# Patient Record
Sex: Female | Born: 2000 | Race: Black or African American | Hispanic: No | Smoking: Never smoker
Health system: Southern US, Community
[De-identification: ages and names within clinical notes are randomized; demographics above are authoritative.]

## PROBLEM LIST (undated history)

## (undated) ENCOUNTER — Inpatient Hospital Stay (HOSPITAL_COMMUNITY): Payer: Self-pay

## (undated) ENCOUNTER — Inpatient Hospital Stay: Payer: Self-pay

## (undated) DIAGNOSIS — F419 Anxiety disorder, unspecified: Secondary | ICD-10-CM

## (undated) DIAGNOSIS — F32A Depression, unspecified: Secondary | ICD-10-CM

## (undated) DIAGNOSIS — G43909 Migraine, unspecified, not intractable, without status migrainosus: Secondary | ICD-10-CM

## (undated) DIAGNOSIS — J45909 Unspecified asthma, uncomplicated: Secondary | ICD-10-CM

## (undated) DIAGNOSIS — T7840XA Allergy, unspecified, initial encounter: Secondary | ICD-10-CM

## (undated) HISTORY — PX: WISDOM TOOTH EXTRACTION: SHX21

## (undated) HISTORY — DX: Allergy, unspecified, initial encounter: T78.40XA

---
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
## 2020-03-19 ENCOUNTER — Other Ambulatory Visit: Payer: Self-pay | Admitting: Physician Assistant

## 2020-03-19 DIAGNOSIS — Z3042 Encounter for surveillance of injectable contraceptive: Secondary | ICD-10-CM

## 2020-03-19 MED ORDER — MEDROXYPROGESTERONE ACETATE 150 MG/ML IM SUSP
150.0000 mg | INTRAMUSCULAR | Status: AC
  Administered 2020-03-20 – 2020-06-05 (×2): 150 mg via INTRAMUSCULAR

## 2020-03-19 NOTE — Progress Notes (Signed)
Per chart review, patient with first visit 09/2019.  CBE due 2022 and pap due 2023.  Provided that patient desires to continue with Depo and BP is normal, may continue with Depo.

## 2020-03-20 ENCOUNTER — Ambulatory Visit (LOCAL_COMMUNITY_HEALTH_CENTER): Payer: Managed Care, Other (non HMO)

## 2020-03-20 ENCOUNTER — Other Ambulatory Visit: Payer: Self-pay

## 2020-03-20 VITALS — BP 131/83 | Ht 63.0 in | Wt 227.5 lb

## 2020-03-20 DIAGNOSIS — Z3042 Encounter for surveillance of injectable contraceptive: Secondary | ICD-10-CM

## 2020-03-20 DIAGNOSIS — Z3009 Encounter for other general counseling and advice on contraception: Secondary | ICD-10-CM

## 2020-03-20 DIAGNOSIS — Z30013 Encounter for initial prescription of injectable contraceptive: Secondary | ICD-10-CM

## 2020-03-20 NOTE — Progress Notes (Signed)
12.3 weeks post depo today.  DMPA 150 mg IM administered today per Sadie Haber, PA order dated 03/19/20 (depo order for 03/20/20 - 09/15/2020). Consulted with Samara Snide, PA regarding pts heavy spotting/dark discharge every day: pt can continue with depo as ordered if she desires, schedule provider appt if she wants to pursue other birth control options. Depo administered per pt request, declines provider appt at this time, will call to schedule appt if needed after this depo shot.

## 2020-03-25 NOTE — Progress Notes (Signed)
New patient visit    Patient: Catherine [name]   DOB: Aug 04, 2001   18 y.o. Female  MRN: [id number] Visit Date: 04/03/2020  Today's healthcare provider: Jairo Ben, FNP  Subjective:    Chief Complaint  Patient presents with  . New Patient (Initial Visit)    Catherine [name] is a 19 y.o. female who presents today as a new patient to establish care.  HPI  Patient reports that she feels well today and has no concerns to address, patient comes to our practice from [facility]. Patient reports that she follows a well balanced diet, she is not actively exercising and states that her sleep is poor. Patient reported difficulty falling asleep and staying asleep, on average patient reported that she sleeps 4-5hrs a night.   She started Depo injections since November, she took last one on April 16 th.  She has had pretty heavy cycles since starting Depo injections. [facility].   Increased thirst and weight gain.  She reports brother is 35 and has diabetes she is unsure of type. Her father also had borderline diabetic. She is sexually active. One partner female partner  Relations. Denies any concerns for STD's amd she also has no history of STD's in past reported.   She has had a history of migraine like headaches since 2018, she gets headaches " very often"  Maybe slightly less than she was in 2018 she reports. She has more than 3 headaches per week. She reports that she usually has generalized headaches but tendency is to be right sided headaches. She has nausea, sensitivity to light and mild dizziness. No vomiting. Denies any any trauma. She reports she gets no relief with Fioricet and Maxalt.  Her father at age 48  and grandfather deceased of brain aneurysm.     She takes Ibuprofen 800 mg every 8 hours with cycle an reports they do not help much with the menstraul cramping.    Patient  denies any fever, body aches,chills, rash, chest pain, shortness of breath, nausea,  vomiting, or diarrhea.   No LMP recorded. Patient has had an injection.   Past Medical History:  Diagnosis Date  . Allergy   . Asthma    past hx  . Migraine    Past Surgical History:  Procedure Laterality Date  . TONSILLECTOMY Bilateral 08/11/2015   Procedure: CONTROL POST TONSILLECTOMY BLEEDING ;  Surgeon: Vernie Murders, MD;  Location: [facility] OR[facility];  Service: ENT;  Laterality: Bilateral;  . TONSILLECTOMY AND ADENOIDECTOMY N/A 07/31/2015   Procedure: TONSILLECTOMY AND ADENOIDECTOMY;  Surgeon: Linus Salmons, MD;  Location: [facility];  Service: ENT;  Laterality: N/A;   Family Status  Relation Name Status  . Father  (Not Specified)  . Brother  (Not Specified)  . MGM  (Not Specified)  . Mother  (Not Specified)   Family History  Problem Relation Age of Onset  . Hypertension Father   . Hypertension Brother   . Diabetes Brother   . Breast cancer Maternal Grandmother   . Cancer Maternal Grandmother   . Bipolar disorder Mother   . Depression Mother   . Cancer Mother   . Schizophrenia Mother    Social History   Socioeconomic History  . Marital status: Single    Spouse name: Not on file  . Number of children: Not on file  . Years of education: Not on file  . Highest education level: Not on file  Occupational History  . Not  on file  Tobacco Use  . Smoking status: Never Smoker  . Smokeless tobacco: Never Used  Substance and Sexual Activity  . Alcohol use: No  . Drug use: Not on file  . Sexual activity: Not on file  Other Topics Concern  . Not on file  Social History Narrative  . Not on file   Social Determinants of Health   Financial Resource Strain:   . Difficulty of Paying Living Expenses:   Food Insecurity:   . Worried About Programme researcher, broadcasting/film/videounning Out of Food in the Last Year:   . Baristaan Out of Food in the Last Year:   Transportation Needs:   . Freight forwarderLack of Transportation (Medical):   Marland Kitchen. Lack of Transportation (Non-Medical):   Physical Activity:   . Days of Exercise per Week:    . Minutes of Exercise per Session:   Stress:   . Feeling of Stress :   Social Connections:   . Frequency of Communication with Friends and Family:   . Frequency of Social Gatherings with Friends and Family:   . Attends Religious Services:   . Active Member of Clubs or Organizations:   . Attends BankerClub or Organization Meetings:   Marland Kitchen. Marital Status:    Outpatient Medications Prior to Visit  Medication Sig  . [DISCONTINUED] butalbital-acetaminophen-caffeine (FIORICET, ESGIC) 50-325-40 MG tablet Take 1 tablet by mouth every 4 (four) hours as needed for headache.  . [DISCONTINUED] rizatriptan (MAXALT) 5 MG tablet Take 1 tablet by mouth as needed for migraine.   Facility-Administered Medications Prior to Visit  Medication Dose Route Frequency Provider  . medroxyPROGESTERone (DEPO-PROVERA) injection 150 mg  150 mg Intramuscular Q90 days Matt HolmesHampton, Carla J, [place]   No Known Allergies  Patient Care Team: Floydene FlockNewton, Steven J, MD as PCP - General (Family Medicine)  Review of Systems  Constitutional: Positive for fatigue. Negative for activity change, appetite change, chills, diaphoresis, fever and unexpected weight change.  Respiratory: Negative.   Cardiovascular: Negative for chest pain, palpitations and leg swelling.  Gastrointestinal: Positive for abdominal pain. Negative for abdominal distention, anal bleeding, blood in stool, constipation, diarrhea, nausea, rectal pain and vomiting.  Endocrine: Positive for polydipsia. Negative for polyphagia and polyuria.  Genitourinary: Positive for menstrual problem and vaginal bleeding. Negative for dyspareunia, vaginal discharge and vaginal pain.  Musculoskeletal: Negative.   Skin: Negative.   Allergic/Immunologic: Positive for environmental allergies.  Neurological: Positive for dizziness and headaches. Negative for tremors, seizures, syncope, facial asymmetry, speech difficulty, weakness, light-headedness and numbness.  Hematological: Negative for  adenopathy. Does not bruise/bleed easily.  Psychiatric/Behavioral: Positive for sleep disturbance. Negative for agitation, behavioral problems, confusion, decreased concentration, dysphoric mood, hallucinations, self-injury and suicidal ideas. The patient is hyperactive. The patient is not nervous/anxious.   All other systems reviewed and are negative.   Last CBC Lab Results  Component Value Date   WBC 11.6 (H) 07/08/2018   HGB 11.8 (L) 07/08/2018   HCT 36.4 07/08/2018   MCV 73.9 (L) 07/08/2018   MCH 24.0 (L) 07/08/2018   RDW 18.3 (H) 07/08/2018   PLT 442 (H) 07/08/2018   Last metabolic panel Lab Results  Component Value Date   GLUCOSE 88 07/08/2018   NA 138 07/08/2018   K 3.9 07/08/2018   CL 105 07/08/2018   CO2 24 07/08/2018   BUN 17 07/08/2018   CREATININE 0.64 07/08/2018   GFRNONAA NOT CALCULATED 07/08/2018   GFRAA NOT CALCULATED 07/08/2018   CALCIUM 8.9 07/08/2018   PROT 6.5 08/10/2015   ALBUMIN 3.6 08/10/2015  BILITOT 0.5 08/10/2015   ALKPHOS 70 08/10/2015   AST 20 08/10/2015   ALT 9 (L) 08/10/2015   ANIONGAP 9 07/08/2018   Last lipids No results found for: CHOL, HDL, LDLCALC, LDLDIRECT, TRIG, CHOLHDL Last hemoglobin A1c No results found for: HGBA1C Last thyroid functions No results found for: TSH, T3TOTAL, T4TOTAL, THYROIDAB Last vitamin D No results found for: 25OHVITD2, 25OHVITD3, VD25OH Last vitamin B12 and Folate No results found for: VITAMINB12, FOLATE     Objective:    BP 110/70   Pulse 91   Temp (!) 96.9 F (36.1 C) (Oral)   Resp 16   Ht  (1.6 m)   Wt 228 lb 9.6 oz (103.7 kg)   SpO2 99%   BMI 40.49 kg/m   Temperature was temporal not oral. Physical Exam Vitals reviewed.  Constitutional:      General: She is not in acute distress.    Appearance: She is well-developed. She is obese. She is not ill-appearing, toxic-appearing or diaphoretic.     Interventions: She is not intubated. HENT:     Head: Normocephalic and atraumatic.      Right Ear: External ear normal.     Left Ear: External ear normal.     Nose: Nose normal.     Mouth/Throat:     Mouth: Mucous membranes are moist.     Pharynx: No oropharyngeal exudate or posterior oropharyngeal erythema.  Eyes:     General: Lids are normal. No scleral icterus.       Right eye: No discharge.        Left eye: No discharge.     Extraocular Movements: Extraocular movements intact.     Conjunctiva/sclera: Conjunctivae normal.     Right eye: Right conjunctiva is not injected. No exudate or hemorrhage.    Left eye: Left conjunctiva is not injected. No exudate or hemorrhage.    Pupils: Pupils are equal, round, and reactive to light.  Neck:     Thyroid: No thyroid mass or thyromegaly.     Vascular: Normal carotid pulses. No carotid bruit, hepatojugular reflux or JVD.     Trachea: Trachea and phonation normal. No tracheal tenderness or tracheal deviation.     Meningeal: Brudzinski's sign and Kernig's sign absent.  Cardiovascular:     Rate and Rhythm: Normal rate and regular rhythm.     Pulses: Normal pulses.          Radial pulses are 2+ on the right side and 2+ on the left side.       Dorsalis pedis pulses are 2+ on the right side and 2+ on the left side.       Posterior tibial pulses are 2+ on the right side and 2+ on the left side.     Heart sounds: Normal heart sounds, S1 normal and S2 normal. Heart sounds not distant. No murmur. No friction rub. No gallop.   Pulmonary:     Effort: Pulmonary effort is normal. No tachypnea, bradypnea, accessory muscle usage or respiratory distress. She is not intubated.     Breath sounds: Normal breath sounds. No stridor. No wheezing, rhonchi or rales.  Chest:     Chest wall: No tenderness.  Abdominal:     General: Bowel sounds are normal. There is no distension or abdominal bruit.     Palpations: Abdomen is soft. There is no shifting dullness, fluid wave, hepatomegaly, splenomegaly, mass or pulsatile mass.     Tenderness: There is  abdominal tenderness in the suprapubic  area. There is no right CVA tenderness, left CVA tenderness, guarding or rebound. Negative signs include Murphy's sign, Rovsing's sign, McBurney's sign, psoas sign and obturator sign.     Hernia: No hernia is present.    Genitourinary:    Comments: Declined.  Musculoskeletal:        General: No swelling, tenderness, deformity or signs of injury. Normal range of motion.     Cervical back: Full passive range of motion without pain, normal range of motion and neck supple. No edema, erythema or rigidity. No spinous process tenderness or muscular tenderness. Normal range of motion.     Right lower leg: No edema.     Left lower leg: No edema.  Lymphadenopathy:     Head:     Right side of head: No submental, submandibular, tonsillar, preauricular, posterior auricular or occipital adenopathy.     Left side of head: No submental, submandibular, tonsillar, preauricular, posterior auricular or occipital adenopathy.     Cervical: No cervical adenopathy.     Right cervical: No superficial, deep or posterior cervical adenopathy.    Left cervical: No superficial, deep or posterior cervical adenopathy.     Upper Body:     Right upper body: No supraclavicular or pectoral adenopathy.     Left upper body: No supraclavicular or pectoral adenopathy.  Skin:    General: Skin is warm and dry.     Capillary Refill: Capillary refill takes less than 2 seconds.     Coloration: Skin is not jaundiced or pale.     Findings: No abrasion, bruising, burn, ecchymosis, erythema, lesion, petechiae or rash.     Nails: There is no clubbing.  Neurological:     Mental Status: She is alert and oriented to person, place, and time.     GCS: GCS eye subscore is 4. GCS verbal subscore is 5. GCS motor subscore is 6.     Cranial Nerves: No cranial nerve deficit.     Sensory: No sensory deficit.     Motor: No tremor, atrophy, abnormal muscle tone or seizure activity.     Coordination:  Coordination normal.     Gait: Gait normal.     Deep Tendon Reflexes: Reflexes are normal and symmetric. Reflexes normal. Babinski sign absent on the right side. Babinski sign absent on the left side.     Reflex Scores:      Tricep reflexes are 2+ on the right side and 2+ on the left side.      Bicep reflexes are 2+ on the right side and 2+ on the left side.      Brachioradialis reflexes are 2+ on the right side and 2+ on the left side.      Patellar reflexes are 2+ on the right side and 2+ on the left side.      Achilles reflexes are 2+ on the right side and 2+ on the left side. Psychiatric:        Mood and Affect: Mood normal.        Speech: Speech normal.        Behavior: Behavior normal.        Thought Content: Thought content normal.        Judgment: Judgment normal.    Patient appers well, not sickly. Speaking in complete sentences. Patient moves on and off of exam table and in room without difficulty. Gait is normal in hall and in room. Patient is oriented to person place time and situation. Patient answers questions  appropriately and engages eye contact and verbal dialect with provider.    No results found for any visits on 04/03/20.    Assessment & Plan:    Annual physical exam - Plan: Comprehensive metabolic panel, CBC with Differential/Platelet, Lipid panel  Allergic rhinitis due to other allergic trigger, unspecified seasonality  Weight gain - Plan: TSH, Iron and TIBC  History of anemia  Polyuria  Body mass index (BMI) of 40.1-44.9 in adult [facility])  Menorrhagia with regular cycle - Plan: Ambulatory referral to Gynecology, hCG, serum, qualitative, US Pelvic Complete With Transvaginal, POCT urine pregnancy, CANCELED: Pregnancy, urine  Vitamin D insufficiency - Plan: VITAMIN D 25 Hydroxy (Vit-D Deficiency, Fractures)  Right lower quadrant pain - Plan: hCG, serum, qualitative, US Pelvic Complete With Transvaginal  Pelvic pain - Plan: Ambulatory referral to Gynecology,  US Pelvic Complete With Transvaginal  Family history of brain aneurysm- father died around age 35  - Plan: Ambulatory referral to Neurology  High risk heterosexual behavior - Plan: HIV antibody (with reflex), Hepatitis panel, acute, RPR, Urine cytology ancillary only  Acute non intractable tension-type headache  Migraine with aura and without status migrainosus, not intractable - Plan: Ambulatory referral to Neurology   Pain is mostly right lower pelvis, and pelvic, urine sent for testing.  Pain is mild on exam with deep palpation. No signs of infection.   Orders Placed This Encounter  Procedures  . US Pelvic Complete With Transvaginal    Order Specific Question:   Reason for Exam (SYMPTOM  OR DIAGNOSIS REQUIRED)    Answer:   right lower quadrant tenderness,heavy menses, and pelvic tenderness.    Order Specific Question:   Preferred imaging location?    Answer:   [facility]    Order Specific Question:   Call Results- Best Contact Number?    Answer:   [phone]  . Comprehensive metabolic panel  . CBC with Differential/Platelet  . Lipid panel  . TSH  . Iron and TIBC  . VITAMIN D 25 Hydroxy (Vit-D Deficiency, Fractures)  . hCG, serum, qualitative  . HIV antibody (with reflex)  . Hepatitis panel, acute  . RPR  . Ambulatory referral to Neurology    Referral Priority:   Routine    Referral Type:   Consultation    Referral Reason:   Specialty Services Required    Referred to Provider:   Melvenia Beam, MD    Requested Specialty:   Neurology    Number of Visits Requested:   1  . Ambulatory referral to Gynecology    Referral Priority:   Routine    Referral Type:   Consultation    Referral Reason:   Specialty Services Required    Requested Specialty:   Gynecology    Number of Visits Requested:   1  . POCT urine pregnancy    Advised patient call the office or your primary care doctor for an appointment if no improvement within 72 hours or if any symptoms change or  worsen at any time  Advised ER or urgent Care if after hours or on weekend. Call 911 for emergency symptoms at any time.Patinet verbalized understanding of all instructions given/reviewed and treatment plan and has no further questions or concerns at this time.   RED flags discussed with pelvic/ abdominal pain.     Return in about 1 week (around 04/10/2020), or if symptoms worsen or fail to improve, for at any time for any worsening symptoms, Go to Emergency room/ urgent care if worse.  IBeverely Pace Arista Kettlewell, FNP, have reviewed all documentation for this visit. The documentation on 04/03/20 for the exam, diagnosis, procedures, and orders are all accurate and complete.    Jairo Ben, FNP  [facility] [phone] (phone) [phone] (fax)  [facility]

## 2020-04-03 ENCOUNTER — Other Ambulatory Visit: Payer: Self-pay

## 2020-04-03 ENCOUNTER — Encounter: Payer: Self-pay | Admitting: Adult Health

## 2020-04-03 ENCOUNTER — Other Ambulatory Visit (HOSPITAL_COMMUNITY)
Admission: RE | Admit: 2020-04-03 | Discharge: 2020-04-03 | Disposition: A | Discharge status: Home / Self Care | Payer: Managed Care, Other (non HMO) | Source: Ambulatory Visit | Attending: Adult Health | Admitting: Adult Health

## 2020-04-03 ENCOUNTER — Ambulatory Visit (INDEPENDENT_AMBULATORY_CARE_PROVIDER_SITE_OTHER): Payer: Managed Care, Other (non HMO) | Admitting: Adult Health

## 2020-04-03 VITALS — BP 110/70 | HR 91 | Temp 96.9°F | Resp 16 | Ht 63.0 in | Wt 228.6 lb

## 2020-04-03 DIAGNOSIS — R102 Pelvic and perineal pain: Secondary | ICD-10-CM

## 2020-04-03 DIAGNOSIS — R1031 Right lower quadrant pain: Secondary | ICD-10-CM | POA: Insufficient documentation

## 2020-04-03 DIAGNOSIS — Z0000 Encounter for general adult medical examination without abnormal findings: Secondary | ICD-10-CM | POA: Insufficient documentation

## 2020-04-03 DIAGNOSIS — N921 Excessive and frequent menstruation with irregular cycle: Secondary | ICD-10-CM

## 2020-04-03 DIAGNOSIS — Z7251 High risk heterosexual behavior: Secondary | ICD-10-CM

## 2020-04-03 DIAGNOSIS — N920 Excessive and frequent menstruation with regular cycle: Secondary | ICD-10-CM | POA: Diagnosis not present

## 2020-04-03 DIAGNOSIS — R3589 Other polyuria: Secondary | ICD-10-CM | POA: Insufficient documentation

## 2020-04-03 DIAGNOSIS — G43109 Migraine with aura, not intractable, without status migrainosus: Secondary | ICD-10-CM

## 2020-04-03 DIAGNOSIS — Z6841 Body Mass Index (BMI) 40.0 and over, adult: Secondary | ICD-10-CM | POA: Insufficient documentation

## 2020-04-03 DIAGNOSIS — Z862 Personal history of diseases of the blood and blood-forming organs and certain disorders involving the immune mechanism: Secondary | ICD-10-CM

## 2020-04-03 DIAGNOSIS — J3089 Other allergic rhinitis: Secondary | ICD-10-CM

## 2020-04-03 DIAGNOSIS — G44209 Tension-type headache, unspecified, not intractable: Secondary | ICD-10-CM

## 2020-04-03 DIAGNOSIS — Z8249 Family history of ischemic heart disease and other diseases of the circulatory system: Secondary | ICD-10-CM | POA: Insufficient documentation

## 2020-04-03 DIAGNOSIS — R635 Abnormal weight gain: Secondary | ICD-10-CM | POA: Diagnosis not present

## 2020-04-03 DIAGNOSIS — R358 Other polyuria: Secondary | ICD-10-CM

## 2020-04-03 DIAGNOSIS — E559 Vitamin D deficiency, unspecified: Secondary | ICD-10-CM

## 2020-04-03 HISTORY — DX: High risk heterosexual behavior: Z72.51

## 2020-04-03 HISTORY — DX: Other polyuria: R35.89

## 2020-04-03 HISTORY — DX: Family history of ischemic heart disease and other diseases of the circulatory system: Z82.49

## 2020-04-03 HISTORY — DX: Excessive and frequent menstruation with irregular cycle: N92.1

## 2020-04-03 HISTORY — DX: Personal history of diseases of the blood and blood-forming organs and certain disorders involving the immune mechanism: Z86.2

## 2020-04-03 HISTORY — DX: Encounter for general adult medical examination without abnormal findings: Z00.00

## 2020-04-03 HISTORY — DX: Pelvic and perineal pain: R10.2

## 2020-04-03 HISTORY — DX: Right lower quadrant pain: R10.31

## 2020-04-03 LAB — POCT URINALYSIS DIPSTICK
Bilirubin, UA: NEGATIVE
Blood, UA: NEGATIVE
Glucose, UA: NEGATIVE
Ketones, UA: NEGATIVE
Leukocytes, UA: NEGATIVE
Nitrite, UA: NEGATIVE
Protein, UA: NEGATIVE
Spec Grav, UA: 1.025 (ref 1.010–1.025)
Urobilinogen, UA: 0.2 E.U./dL
pH, UA: 5 (ref 5.0–8.0)

## 2020-04-03 LAB — POCT URINE PREGNANCY: Preg Test, Ur: NEGATIVE

## 2020-04-03 NOTE — Patient Instructions (Signed)
Fat and Cholesterol Restricted Eating Plan Getting too much fat and cholesterol in your diet may cause health problems. Choosing the right foods helps keep your fat and cholesterol at normal levels. This can keep you from getting certain diseases. Your doctor may recommend an eating plan that includes:  Total fat: ______% or less of total calories a day.  Saturated fat: ______% or less of total calories a day.  Cholesterol: less than _________mg a day.  Fiber: ______g a day. What are tips for following this plan? Meal planning  At meals, divide your plate into four equal parts: ? Fill one-half of your plate with vegetables and green salads. ? Fill one-fourth of your plate with whole grains. ? Fill one-fourth of your plate with low-fat (lean) protein foods.  Eat fish that is high in omega-3 fats at least two times a week. This includes mackerel, tuna, sardines, and salmon.  Eat foods that are high in fiber, such as whole grains, beans, apples, broccoli, carrots, peas, and barley. General tips   Work with your doctor to lose weight if you need to.  Avoid: ? Foods with added sugar. ? Fried foods. ? Foods with partially hydrogenated oils.  Limit alcohol intake to no more than 1 drink a day for nonpregnant women and 2 drinks a day for men. One drink equals 12 oz of beer, 5 oz of wine, or 1 oz of hard liquor. Reading food labels  Check food labels for: ? Trans fats. ? Partially hydrogenated oils. ? Saturated fat (g) in each serving. ? Cholesterol (mg) in each serving. ? Fiber (g) in each serving.  Choose foods with healthy fats, such as: ? Monounsaturated fats. ? Polyunsaturated fats. ? Omega-3 fats.  Choose grain products that have whole grains. Look for the word "whole" as the first word in the ingredient list. Cooking  Cook foods using low-fat methods. These include baking, boiling, grilling, and broiling.  Eat more home-cooked foods. Eat at restaurants and buffets  less often.  Avoid cooking using saturated fats, such as butter, cream, palm oil, palm kernel oil, and coconut oil. Recommended foods  Fruits  All fresh, canned (in natural juice), or frozen fruits. Vegetables  Fresh or frozen vegetables (raw, steamed, roasted, or grilled). Green salads. Grains  Whole grains, such as whole wheat or whole grain breads, crackers, cereals, and pasta. Unsweetened oatmeal, bulgur, barley, quinoa, or brown rice. Corn or whole wheat flour tortillas. Meats and other protein foods  Ground beef (85% or leaner), grass-fed beef, or beef trimmed of fat. Skinless chicken or turkey. Ground chicken or turkey. Pork trimmed of fat. All fish and seafood. Egg whites. Dried beans, peas, or lentils. Unsalted nuts or seeds. Unsalted canned beans. Nut butters without added sugar or oil. Dairy  Low-fat or nonfat dairy products, such as skim or 1% milk, 2% or reduced-fat cheeses, low-fat and fat-free ricotta or cottage cheese, or plain low-fat and nonfat yogurt. Fats and oils  Tub margarine without trans fats. Light or reduced-fat mayonnaise and salad dressings. Avocado. Olive, canola, sesame, or safflower oils. The items listed above may not be a complete list of foods and beverages you can eat. Contact a dietitian for more information. Foods to avoid Fruits  Canned fruit in heavy syrup. Fruit in cream or butter sauce. Fried fruit. Vegetables  Vegetables cooked in cheese, cream, or butter sauce. Fried vegetables. Grains  White bread. White pasta. White rice. Cornbread. Bagels, pastries, and croissants. Crackers and snack foods that contain trans fat   and hydrogenated oils. Meats and other protein foods  Fatty cuts of meat. Ribs, chicken wings, bacon, sausage, bologna, salami, chitterlings, fatback, hot dogs, bratwurst, and packaged lunch meats. Liver and organ meats. Whole eggs and egg yolks. Chicken and turkey with skin. Fried meat. Dairy  Whole or 2% milk, cream,  half-and-half, and cream cheese. Whole milk cheeses. Whole-fat or sweetened yogurt. Full-fat cheeses. Nondairy creamers and whipped toppings. Processed cheese, cheese spreads, and cheese curds. Beverages  Alcohol. Sugar-sweetened drinks such as sodas, lemonade, and fruit drinks. Fats and oils  Butter, stick margarine, lard, shortening, ghee, or bacon fat. Coconut, palm kernel, and palm oils. Sweets and desserts  Corn syrup, sugars, honey, and molasses. Candy. Jam and jelly. Syrup. Sweetened cereals. Cookies, pies, cakes, donuts, muffins, and ice cream. The items listed above may not be a complete list of foods and beverages you should avoid. Contact a dietitian for more information. Summary  Choosing the right foods helps keep your fat and cholesterol at normal levels. This can keep you from getting certain diseases.  At meals, fill one-half of your plate with vegetables and green salads.  Eat high-fiber foods, like whole grains, beans, apples, carrots, peas, and barley.  Limit added sugar, saturated fats, alcohol, and fried foods. This information is not intended to replace advice given to you by your health care provider. Make sure you discuss any questions you have with your health care provider. Document Revised: 07/25/2018 Document Reviewed: 08/08/2017 Elsevier Patient Education  2020 [employer]  

## 2020-04-07 LAB — COMPREHENSIVE METABOLIC PANEL
ALT: 12 IU/L (ref 0–32)
AST: 17 IU/L (ref 0–40)
Albumin/Globulin Ratio: 1.6 (ref 1.2–2.2)
Albumin: 4.1 g/dL (ref 3.9–5.0)
Alkaline Phosphatase: 86 IU/L (ref 43–101)
BUN/Creatinine Ratio: 12 (ref 9–23)
BUN: 8 mg/dL (ref 6–20)
Bilirubin Total: 0.3 mg/dL (ref 0.0–1.2)
CO2: 18 mmol/L — ABNORMAL LOW (ref 20–29)
Calcium: 9 mg/dL (ref 8.7–10.2)
Chloride: 106 mmol/L (ref 96–106)
Creatinine, Ser: 0.66 mg/dL (ref 0.57–1.00)
GFR calc Af Amer: 149 mL/min/{1.73_m2} (ref >59)
GFR calc non Af Amer: 129 mL/min/{1.73_m2} (ref >59)
Globulin, Total: 2.6 g/dL (ref 1.5–4.5)
Glucose: 82 mg/dL (ref 65–99)
Potassium: 4.5 mmol/L (ref 3.5–5.2)
Sodium: 141 mmol/L (ref 134–144)
Total Protein: 6.7 g/dL (ref 6.0–8.5)

## 2020-04-07 LAB — CBC WITH DIFFERENTIAL/PLATELET
Basophils Absolute: 0 10*3/uL (ref 0.0–0.2)
Basos: 0 %
EOS (ABSOLUTE): 0.3 10*3/uL (ref 0.0–0.4)
Eos: 3 %
Hematocrit: 38.2 % (ref 34.0–46.6)
Hemoglobin: 12.1 g/dL (ref 11.1–15.9)
Immature Grans (Abs): 0.1 10*3/uL (ref 0.0–0.1)
Immature Granulocytes: 1 %
Lymphocytes Absolute: 3.2 10*3/uL — ABNORMAL HIGH (ref 0.7–3.1)
Lymphs: 35 %
MCH: 22.8 pg — ABNORMAL LOW (ref 26.6–33.0)
MCHC: 31.7 g/dL (ref 31.5–35.7)
MCV: 72 fL — ABNORMAL LOW (ref 79–97)
Monocytes Absolute: 0.7 10*3/uL (ref 0.1–0.9)
Monocytes: 7 %
Neutrophils Absolute: 5 10*3/uL (ref 1.4–7.0)
Neutrophils: 54 %
Platelets: 425 10*3/uL (ref 150–450)
RBC: 5.3 x10E6/uL — ABNORMAL HIGH (ref 3.77–5.28)
RDW: 17.2 % — ABNORMAL HIGH (ref 11.7–15.4)
WBC: 9.3 10*3/uL (ref 3.4–10.8)

## 2020-04-07 LAB — IRON AND TIBC
Iron Saturation: 24 % (ref 15–55)
Iron: 90 ug/dL (ref 27–159)
Total Iron Binding Capacity: 382 ug/dL (ref 250–450)
UIBC: 292 ug/dL (ref 131–425)

## 2020-04-07 LAB — LIPID PANEL
Chol/HDL Ratio: 5 ratio — ABNORMAL HIGH (ref 0.0–4.4)
Cholesterol, Total: 166 mg/dL (ref 100–169)
HDL: 33 mg/dL — ABNORMAL LOW (ref >39)
LDL Chol Calc (NIH): 119 mg/dL — ABNORMAL HIGH (ref 0–109)
Triglycerides: 72 mg/dL (ref 0–89)
VLDL Cholesterol Cal: 14 mg/dL (ref 5–40)

## 2020-04-07 LAB — HEPATITIS PANEL, ACUTE
Hep A IgM: NEGATIVE
Hep B C IgM: NEGATIVE
Hep C Virus Ab: <0.1 s/co ratio (ref 0.0–0.9)
Hepatitis B Surface Ag: NEGATIVE

## 2020-04-07 LAB — RPR: RPR Ser Ql: NONREACTIVE

## 2020-04-07 LAB — TSH: TSH: 0.834 u[IU]/mL (ref 0.450–4.500)

## 2020-04-07 LAB — HCG, SERUM, QUALITATIVE: hCG,Beta Subunit,Qual,Serum: NEGATIVE m[IU]/mL (ref <6)

## 2020-04-07 LAB — HIV ANTIBODY (ROUTINE TESTING W REFLEX): HIV Screen 4th Generation wRfx: NONREACTIVE

## 2020-04-07 LAB — VITAMIN D 25 HYDROXY (VIT D DEFICIENCY, FRACTURES): Vit D, 25-Hydroxy: 10.2 ng/mL — ABNORMAL LOW (ref 30.0–100.0)

## 2020-04-08 ENCOUNTER — Telehealth: Payer: Self-pay

## 2020-04-08 DIAGNOSIS — E559 Vitamin D deficiency, unspecified: Secondary | ICD-10-CM

## 2020-04-08 MED ORDER — VITAMIN D (ERGOCALCIFEROL) 1.25 MG (50000 UNIT) PO CAPS: 50000.0000 [IU] | ORAL_CAPSULE | ORAL | 0 refills | Status: DC

## 2020-04-08 NOTE — Telephone Encounter (Signed)
-----   Message from Berniece Pap, FNP sent at 04/08/2020 11:12 AM EDT ----- CMP within normal.  Vitamin D is insufficient. Will do script for Vitamin D at 50,000 units once per week by mouth for 10 weeks and then need to recheck lab.   LDL elevated. HDL is low.   Discuss lifestyle modification with patient e.g. increase exercise, fiber, fruits, vegetables, lean meat, and omega 3/fish intake and decrease saturated fat.  If patient following strict diet and exercise program already please schedule follow up appointment with primary care physician  Negative blood pregnancy test. Negative HIV screening- non reactive. RPR for syphilis is negative.   She is on the verge of being anemic, she should start a multivitamin with iron in it if she is not already on it. If she is taking already let me know.  Need to recheck Vitamin D, CBC in three months- please add  Let me know if she which pharmacy for vitamin D and instruct to return for above lab orders.

## 2020-04-08 NOTE — Telephone Encounter (Signed)
Patient has been advised and prescription sent to [facility] on [address] [address] [facility] as requested. KW

## 2020-04-08 NOTE — Progress Notes (Signed)
CMP within normal.  Vitamin D is insufficient. Will do script for Vitamin D at 50,000 units once per week by mouth for 10 weeks and then need to recheck lab.   LDL elevated. HDL is low.   Discuss lifestyle modification with patient e.g. increase exercise, fiber, fruits, vegetables, lean meat, and omega 3/fish intake and decrease saturated fat.  If patient following strict diet and exercise program already please schedule follow up appointment with primary care physician  Negative blood pregnancy test. Negative HIV screening- non reactive. RPR for syphilis is negative.   She is on the verge of being anemic, she should start a multivitamin with iron in it if she is not already on it. If she is taking already let me know.  Need to recheck Vitamin D, CBC in three months- please add  Let me know if she which pharmacy for vitamin D and instruct to return for above lab orders.

## 2020-04-09 LAB — URINE CYTOLOGY ANCILLARY ONLY
Bacterial Vaginitis-Urine: NEGATIVE
Candida Urine: NEGATIVE
Chlamydia: NEGATIVE
Comment: NEGATIVE
Comment: NEGATIVE
Comment: NORMAL
Neisseria Gonorrhea: NEGATIVE
Trichomonas: NEGATIVE

## 2020-04-09 NOTE — Progress Notes (Signed)
Negative gonorrhea, chlamydia, trichomonas, bacterial vaginitis and yeast.

## 2020-04-13 ENCOUNTER — Ambulatory Visit: Payer: Managed Care, Other (non HMO)

## 2020-04-14 ENCOUNTER — Encounter: Payer: Self-pay | Admitting: Adult Health

## 2020-04-14 ENCOUNTER — Encounter: Payer: Managed Care, Other (non HMO) | Admitting: Obstetrics

## 2020-05-05 ENCOUNTER — Ambulatory Visit: Payer: Self-pay | Admitting: Adult Health

## 2020-05-11 ENCOUNTER — Ambulatory Visit: Payer: Managed Care, Other (non HMO)

## 2020-06-01 ENCOUNTER — Ambulatory Visit: Payer: Managed Care, Other (non HMO) | Admitting: Neurology

## 2020-06-05 ENCOUNTER — Other Ambulatory Visit: Payer: Self-pay

## 2020-06-05 ENCOUNTER — Ambulatory Visit (LOCAL_COMMUNITY_HEALTH_CENTER): Payer: Managed Care, Other (non HMO)

## 2020-06-05 VITALS — BP 125/80 | Ht 63.0 in | Wt 231.5 lb

## 2020-06-05 DIAGNOSIS — Z30013 Encounter for initial prescription of injectable contraceptive: Secondary | ICD-10-CM

## 2020-06-05 DIAGNOSIS — Z3009 Encounter for other general counseling and advice on contraception: Secondary | ICD-10-CM

## 2020-06-05 MED ORDER — MULTI-VITAMIN/MINERALS PO TABS
1.0000 | ORAL_TABLET | Freq: Every day | ORAL | 0 refills | Status: DC
Start: 2020-06-05 — End: 2020-06-05

## 2020-06-05 MED ORDER — MULTI-VITAMIN/MINERALS PO TABS: 1.0000 | ORAL_TABLET | Freq: Every day | ORAL | 0 refills | Status: DC

## 2020-07-23 IMAGING — CT CT HEAD W/O CM
3 series · 16 of 47 positions shown, 19 images · non-contrast
Comparison: None.

CLINICAL DATA: Chronic headache; acute onset of syncope.

EXAM:
CT HEAD WITHOUT CONTRAST
TECHNIQUE: Contiguous axial images were obtained from the base of the skull
through the vertex without intravenous contrast.

[Series 2: head wo · axial · 0.47mm/px · z∈[-134,-9]mm · 10 of 30 slices shown, 13 images]
[im 3/30  brain]
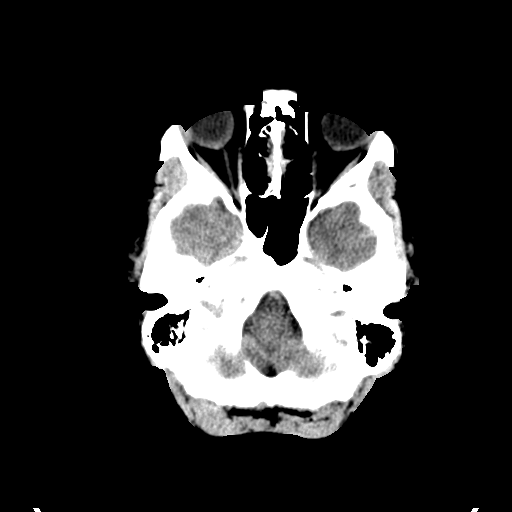
[im 3/30  bone]
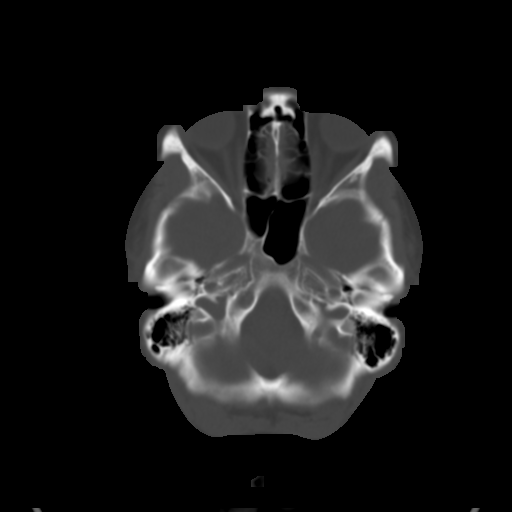
[im 6/30  brain]
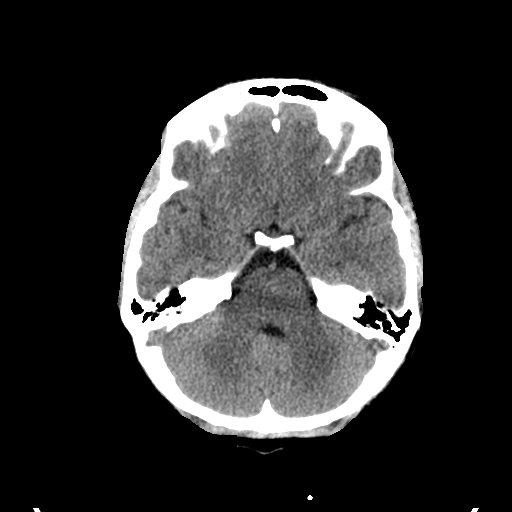
[im 9/30  brain]
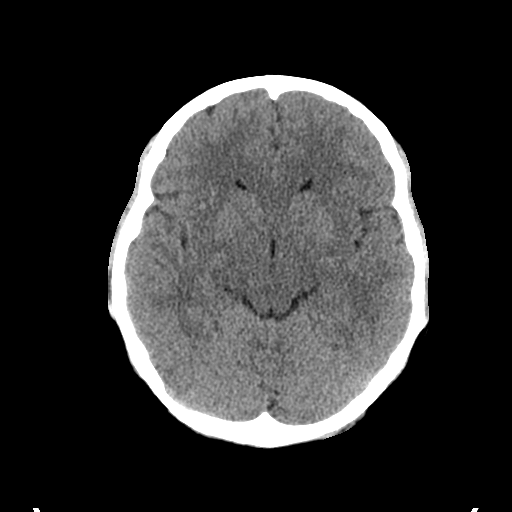
[im 11/30  brain]
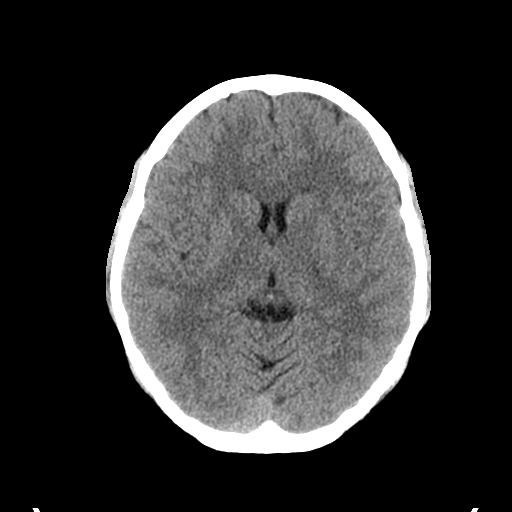
[im 14/30  brain]
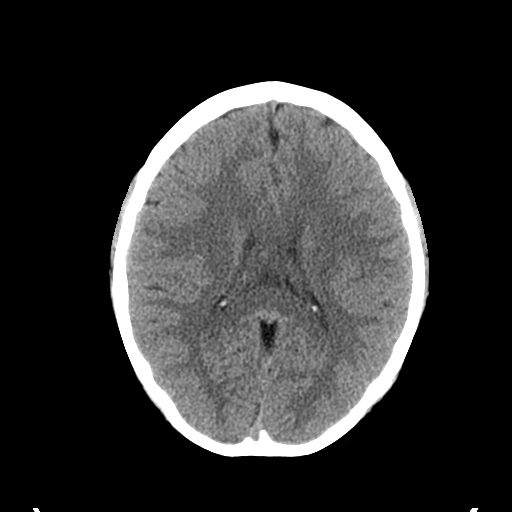
[im 14/30  bone]
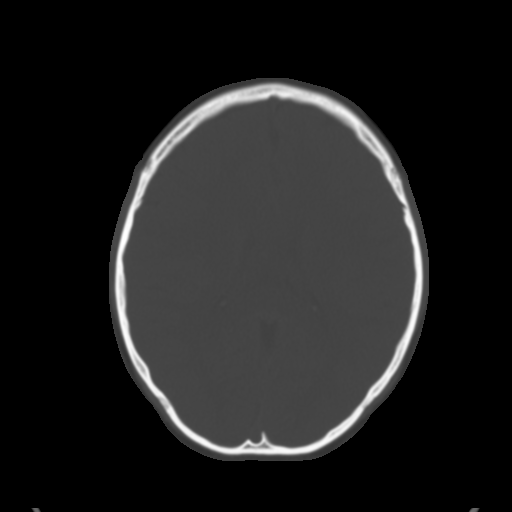
[im 17/30  brain]
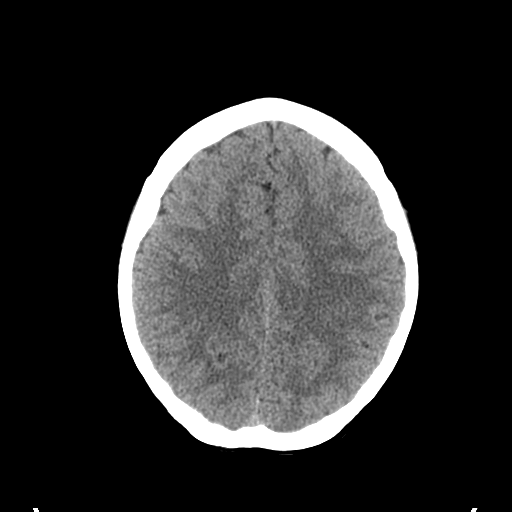
[im 20/30  brain]
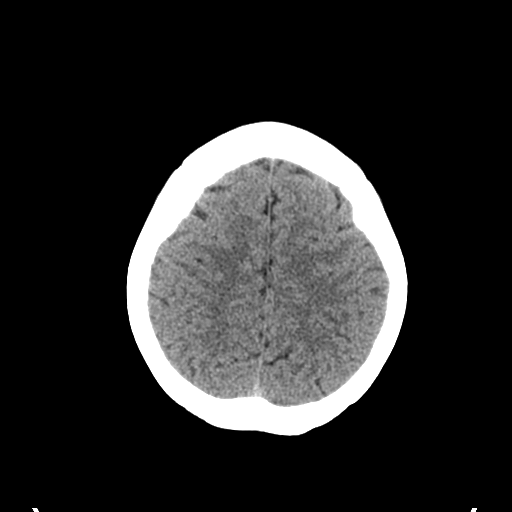
[im 23/30  brain]
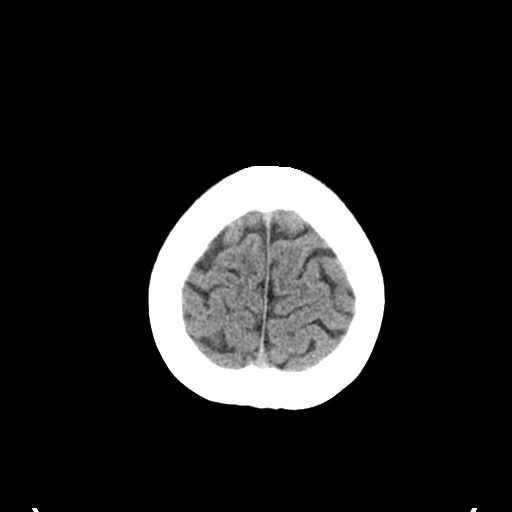
[im 25/30  brain]
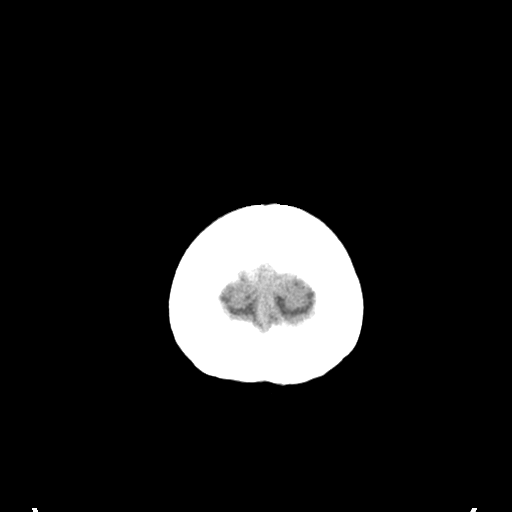
[im 25/30  bone]
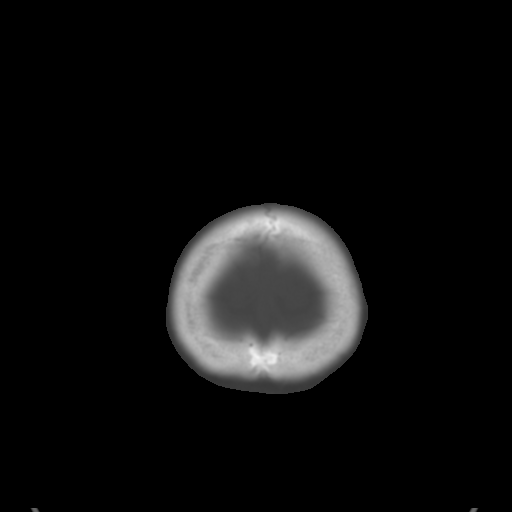
[im 28/30  brain]
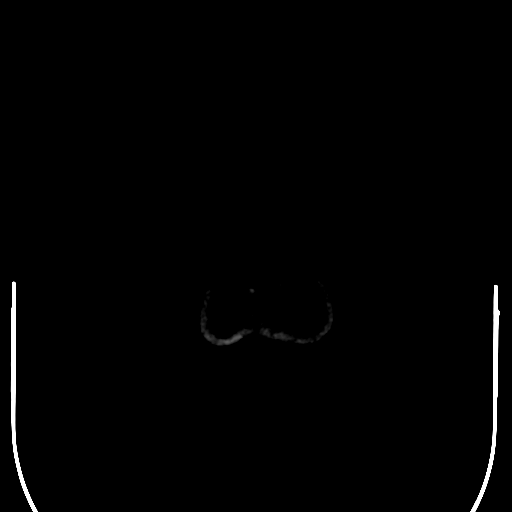

[Series 4: coronal soft tissue · coronal · 0.31mm/px · 3 of 64 slices shown]
[im 22/64  brain]
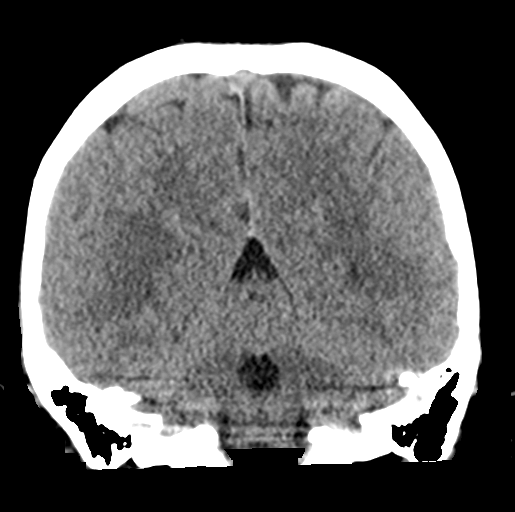
[im 29/64  brain]
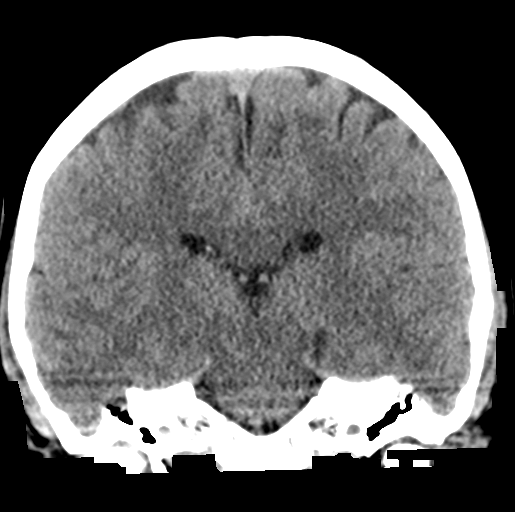
[im 36/64  brain]
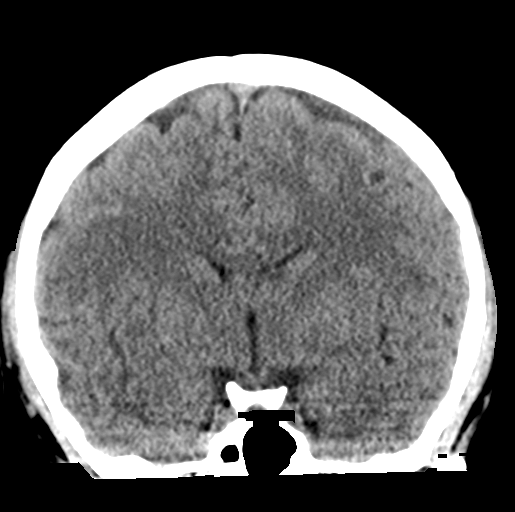

[Series 5: sagittal soft tissue · sagittal · 0.31mm/px · 3 of 57 slices shown]
[im 19/57  brain]
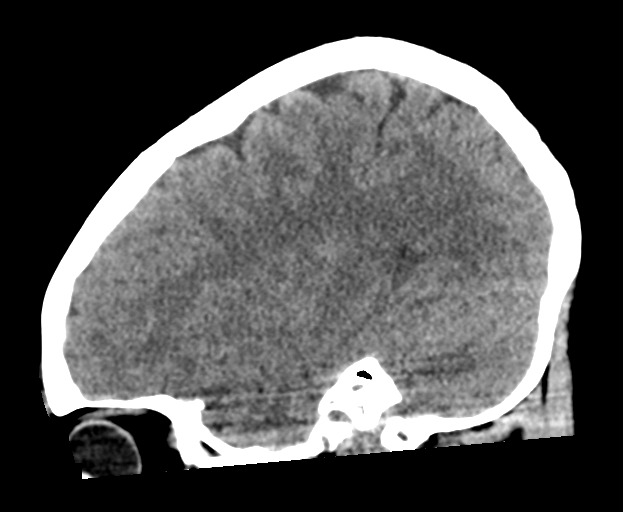
[im 29/57  brain]
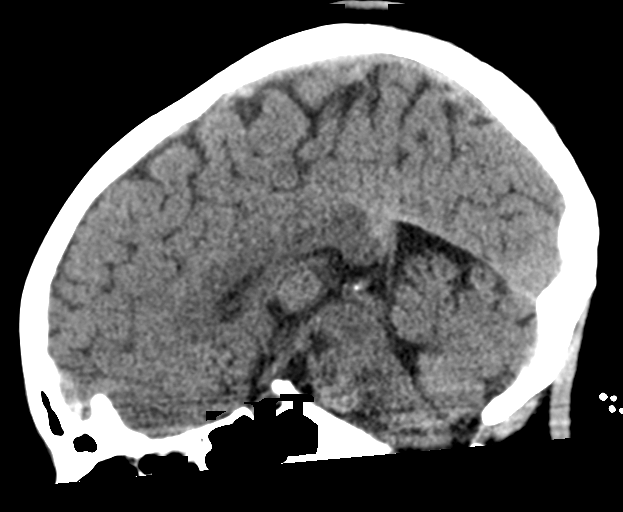
[im 38/57  brain]
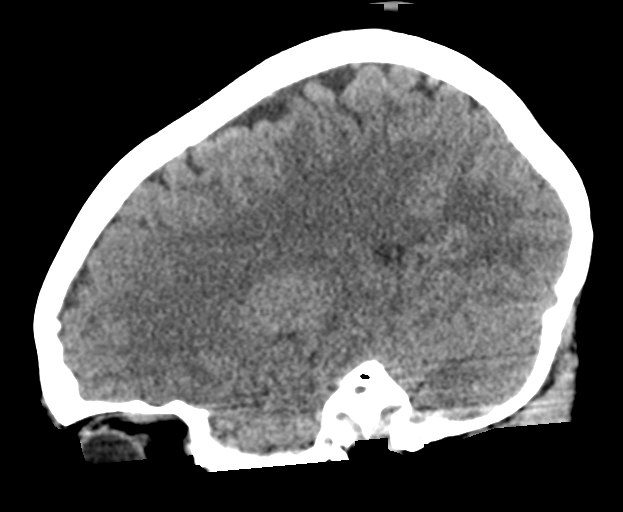

[16 of 47 positions shown; findings below may reference images not displayed]

FINDINGS: Brain: No evidence of acute infarction, hemorrhage, hydrocephalus,
extra-axial collection or mass lesion/mass effect.

The posterior fossa, including the cerebellum, brainstem and fourth
ventricle, is within normal limits. The third and lateral
ventricles, and basal ganglia are unremarkable in appearance. The
cerebral hemispheres are symmetric in appearance, with normal
gray-white differentiation. No mass effect or midline shift is seen.

Vascular: No hyperdense vessel or unexpected calcification.

Skull: There is no evidence of fracture; visualized osseous
structures are unremarkable in appearance.

Sinuses/Orbits: The visualized portions of the orbits are within
normal limits. The paranasal sinuses and mastoid air cells are
well-aerated.

Other: No significant soft tissue abnormalities are seen.
IMPRESSION: Unremarkable noncontrast CT of the head.

## 2020-08-17 ENCOUNTER — Telehealth: Payer: Self-pay | Admitting: Family Medicine

## 2020-08-17 NOTE — Telephone Encounter (Signed)
Pt. would like to change from Depo to [facility] ,will she  need to wait until her Depo is due to get the IUD?

## 2020-08-17 NOTE — Telephone Encounter (Signed)
Returned patient call. Patient interested in Paragaurd insertion. Patient currently on depo and is [redacted]w[redacted]d from last depo. Patient last sex unprotected on 08/14/2020. Patient only has some spotting with depo, last spotting was 08/11/2020. Patient counseled to abstain from sex/ or used condom with each sex encounter until IUD insertion. Patient scheduled for FP/IUD insertion on 08/25/2020. All questions answered.   Harvie Heck, RN

## 2020-08-25 ENCOUNTER — Ambulatory Visit: Payer: Self-pay

## 2020-08-25 ENCOUNTER — Other Ambulatory Visit: Payer: Self-pay

## 2020-08-25 ENCOUNTER — Encounter: Payer: Self-pay | Admitting: Family Medicine

## 2020-08-25 ENCOUNTER — Ambulatory Visit: Payer: Managed Care, Other (non HMO)

## 2020-08-25 ENCOUNTER — Ambulatory Visit (LOCAL_COMMUNITY_HEALTH_CENTER): Payer: Self-pay | Admitting: Family Medicine

## 2020-08-25 VITALS — BP 139/90 | Ht 63.0 in | Wt 238.0 lb

## 2020-08-25 DIAGNOSIS — Z30430 Encounter for insertion of intrauterine contraceptive device: Secondary | ICD-10-CM

## 2020-08-25 DIAGNOSIS — Z3009 Encounter for other general counseling and advice on contraception: Secondary | ICD-10-CM

## 2020-08-25 MED ORDER — PARAGARD INTRAUTERINE COPPER IU IUD
1.0000 | INTRAUTERINE_SYSTEM | Freq: Once | INTRAUTERINE | Status: AC
  Administered 2020-08-25: 1 via INTRAUTERINE

## 2020-08-25 NOTE — Progress Notes (Signed)
[facility]  Subjective:  Monte Bronder is a 19 y.o. being seen today for   Chief Complaint  Patient presents with  . Annual Exam    complete [facility] requirements  . Contraception    Paragard insertion    HPI  Client is here today for Paragard IUD insertion annual exam done 04/03/2020 @ [facility].  Client is 11 4/7 weeks since last Depo.Client denies concerns today.   Does the patient have a current or past history of drug use? No   No components found for: HCV]   Health Maintenance Due  Topic Date Due  . COVID-19 Vaccine (1) Never done  . CHLAMYDIA SCREENING  Never done  . INFLUENZA VACCINE  Never done    Review of Systems  Constitutional:       Has a h/o of weight gain, lightheadedness- PCP is managing.  Neurological: Positive for headaches.       H/o of headaches.  PCP managing    The following portions of the patient's history were reviewed and updated as appropriate: allergies, current medications, past family history, past medical history, past social history, past surgical history and problem list. Problem list updated.   See flowsheet for other program required questions.  Objective:   Vitals:   08/25/20 1454  BP: 139/90  Weight: 238 lb (108 kg)  Height: 5\' 3"  (1.6 m)    Physical Exam Constitutional:      Appearance: She is obese.  Pulmonary:     Effort: Pulmonary effort is normal.  Genitourinary:    General: Normal vulva.     Labia:        Right: No rash, tenderness, lesion or injury.        Left: No rash, tenderness, lesion or injury.      Vagina: Normal. No vaginal discharge.     Cervix: No cervical motion tenderness, friability or erythema.     Uterus: Not tender.      Adnexa:        Right: No mass, tenderness or fullness.         Left: No mass, tenderness or fullness.       Rectum: Normal.     Comments: Uterus - difficult to assess position d/t body habitus Lymphadenopathy:      Lower Body: No right inguinal adenopathy. No left inguinal adenopathy.  Neurological:     Mental Status: She is alert and oriented to person, place, and time.  Psychiatric:        Behavior: Behavior normal.     Assessment and Plan:  Lileigh Fahringer is a 19 y.o. female presenting to the [facility] Department for a Women's Health problem visit  1. Family planning Client had her annual well-woman's exam 04/03/2020 @ 04/05/2020 (per Epic note) - HIV [place] [facility] - Syphilis Serology, [facility]  2. Encounter for IUD insertion  - Chlamydia/Gonorrhea [place] [facility] - paragard intrauterine copper IUD 1 each  Patient presented to [facility] for IUD insertion. Her GC/CT screening was found to be up to date and using WHO criteria we can be reasonably certain she is not pregnant or a pregnancy test was obtained which was Urine pregnancy test  today was N/A.  See Flowsheet for IUD check list  IUD Insertion Procedure Note Patient identified, informed consent performed, consent signed.   Discussed risks of irregular bleeding, cramping, infection, malpositioning or misplacement of the IUD outside  the uterus which may require further procedure such as laparoscopy. Time out was performed.    Speculum placed in the vagina.  Cervix visualized.  Cleaned with Betadine x 2.  Grasped anteriorly with a single tooth tenaculum.  Uterus sounded to 8 cm.  IUD placed per manufacturer's recommendations.  Strings trimmed to 3 cm. Tenaculum was removed, good hemostasis noted.  Patient tolerated procedure well.   Patient was given post-procedure instructions- both agency handout and verbally by provider.  She was advised to have backup contraception for one week.  Patient was also asked to check IUD strings periodically or follow up in 4 weeks for IUD check.   Return in about 1 month (around 09/24/2020), or if symptoms worsen or fail to improve, for IUD string check if needed..  No future  appointments.  Larene Pickett, FNP

## 2020-08-25 NOTE — Progress Notes (Signed)
Pt to clinic for physical and Paragard insertion.

## 2020-09-24 ENCOUNTER — Other Ambulatory Visit: Payer: Self-pay

## 2020-09-24 ENCOUNTER — Encounter: Payer: Self-pay | Admitting: Physician Assistant

## 2020-09-24 ENCOUNTER — Ambulatory Visit (LOCAL_COMMUNITY_HEALTH_CENTER): Payer: Self-pay | Admitting: Physician Assistant

## 2020-09-24 VITALS — BP 117/69 | Ht 63.0 in | Wt 240.6 lb

## 2020-09-24 DIAGNOSIS — Z3009 Encounter for other general counseling and advice on contraception: Secondary | ICD-10-CM

## 2020-09-24 NOTE — Progress Notes (Signed)
Here today for IUD check. Last PE was 04/03/2020 at [facility] (in Epic.) Paragard inserted here 08/25/20. States sex is very painful. States she is not sure if IUD has moved or is out of place. Tawny Hopping, RN

## 2020-09-24 NOTE — Progress Notes (Signed)
S: 19 yo here for IUD string check and eval of pain/bleeding. Had Paraguard insertion 08/25/20. Reports developed cramping and vag bleeding beginning 2-3 days after insertion. Since then has had bleeding requiring 2 pads/tampons per day, initially red, now brownish. Has taken ibuprofen 800 mg up to 3 times weekly and this has helped the cramping. Had sex several times, which exacerbated the cramping. Partner can feel strings, but he does not have discomfort during sex. Pt can feels strings herself. Has had initial COVID vaccines.  O: Pleasant woman, appears well. Pelvic exam: IUD strings palpable extending 2-3 cm from os. No CMT, no uterine or adnexal tenderness. Sm amt brown-tinged discharge. A/P: IUD appropriately located. Pt desires to keep IUD. Suspect cramping/bleeding secondary to uterine adjustment to IUD. Rec ibuprofen 600-800 mg tid for 3-5 days, and and return to clinic if sx worsen or persist. Counseled re: possibility of ongoing irreg menses. Pt in agreement with plan. Annual well-woman exam due 04/2021.

## 2020-12-15 ENCOUNTER — Other Ambulatory Visit: Payer: Self-pay

## 2020-12-15 ENCOUNTER — Ambulatory Visit
Admission: EM | Admit: 2020-12-15 | Discharge: 2020-12-15 | Disposition: A | Discharge status: Home / Self Care | Payer: Medicaid Other | Attending: Emergency Medicine | Admitting: Emergency Medicine

## 2020-12-15 DIAGNOSIS — J069 Acute upper respiratory infection, unspecified: Secondary | ICD-10-CM | POA: Insufficient documentation

## 2020-12-15 DIAGNOSIS — Z20822 Contact with and (suspected) exposure to covid-19: Secondary | ICD-10-CM | POA: Diagnosis not present

## 2020-12-15 MED ORDER — BENZONATATE 100 MG PO CAPS
200.0000 mg | ORAL_CAPSULE | Freq: Three times a day (TID) | ORAL | 0 refills | Status: DC
Start: 2020-12-15 — End: 2021-06-13

## 2020-12-15 MED ORDER — PROMETHAZINE-DM 6.25-15 MG/5ML PO SYRP
5.0000 mL | ORAL_SOLUTION | Freq: Four times a day (QID) | ORAL | 0 refills | Status: DC | PRN
Start: 2020-12-15 — End: 2021-06-13

## 2020-12-15 NOTE — ED Triage Notes (Signed)
Patient presents to Urgent Care with complaints of body aches, HA, chills, and dizziness since today. Pt has a hx of migraines.   Denies fever.

## 2020-12-15 NOTE — ED Provider Notes (Signed)
[facility]    CSN: [id number] Arrival date & time: 12/15/20  1312      History   Chief Complaint Chief Complaint  Patient presents with  . Chills  . Headache  . Generalized Body Aches  . Dizziness    HPI Porcha Deblanc is a 20 y.o. female.   HPI   20 year old female here for evaluation of COVID-like symptoms that started this morning.  Patient reports that she has been vaccinated but not boosted against COVID.  Patient reports that she developed headache, runny nose and nasal congestion, changes to her hearing, chills, nonproductive cough and shortness of breath, and some dizziness.  Patient denies fever, sore throat, wheezing, GI complaints, or changes to her sense of taste or smell.  Patient was exposed to COVID through her grandmother.  Past Medical History:  Diagnosis Date  . Allergy   . Asthma    past hx  . Migraine     Patient Active Problem List   Diagnosis Date Noted  . Pelvic pain 2020/04/14  . Right lower quadrant pain 04-14-20  . Vitamin D insufficiency Apr 14, 2020  . Menorrhagia with regular cycle 2020-04-14  . Body mass index (BMI) of 40.1-44.9 in adult (HCC) 2020/04/14  . Polyuria 04-14-2020  . History of anemia Apr 14, 2020  . Weight gain 04-14-20  . Annual physical exam 04/14/2020  . Family history of brain aneurysm- father died around age 8  Apr 14, 2020  . High risk heterosexual behavior 04-14-2020  . Migraine with aura and without status migrainosus, not intractable 04/14/20  . Acute non intractable tension-type headache 08/14/2018  . OVERWEIGHT 10/22/2010  . Allergic rhinitis 04/01/2010  . ASTHMA, INTERMITTENT 02/01/2007    Past Surgical History:  Procedure Laterality Date  . TONSILLECTOMY Bilateral 08/11/2015   Procedure: CONTROL POST TONSILLECTOMY BLEEDING ;  Surgeon: Vernie Murders, MD;  Location: [facility];  Service: ENT;  Laterality: Bilateral;  . TONSILLECTOMY AND ADENOIDECTOMY N/A 07/31/2015   Procedure: TONSILLECTOMY AND  ADENOIDECTOMY;  Surgeon: Linus Salmons, MD;  Location: [facility];  Service: ENT;  Laterality: N/A;    OB History    Gravida  0   Para  0   Term  0   Preterm  0   AB  0   Living  0     SAB  0   IAB  0   Ectopic  0   Multiple  0   Live Births  0            Home Medications    Prior to Admission medications   Medication Sig Start Date End Date Taking? Authorizing Provider  benzonatate (TESSALON) 100 MG capsule Take 2 capsules (200 mg total) by mouth every 8 (eight) hours. 12/15/20  Yes Becky Augusta, NP  promethazine-dextromethorphan (PROMETHAZINE-DM) 6.25-15 MG/5ML syrup Take 5 mLs by mouth 4 (four) times daily as needed. 12/15/20  Yes Becky Augusta, NP  ibuprofen (ADVIL) 200 MG tablet Take 200 mg by mouth every 6 (six) hours as needed.    [provider]  Multiple Vitamins-Minerals (MULTIVITAMIN WITH MINERALS) tablet Take 1 tablet by mouth daily. Patient not taking: Reported on 08/25/2020 06/05/20   Federico Flake, MD  Vitamin D, Ergocalciferol, (DRISDOL) 1.25 MG (50000 UNIT) CAPS capsule Take 1 capsule (50,000 Units total) by mouth every 7 (seven) days. Patient not taking: Reported on 08/25/2020 04/08/20   Flinchum, Eula Fried, FNP    Family History Family History  Problem Relation Age of Onset  . Hypertension Father   .  Aneurysm Father   . Hypertension Brother   . Diabetes Brother   . Breast cancer Maternal Grandmother   . Cancer Maternal Grandmother   . Lung disease Maternal Grandmother   . Bipolar disorder Mother   . Depression Mother   . Cancer Mother   . Schizophrenia Mother   . Aneurysm Paternal Grandfather   . Heart attack Maternal Grandfather     Social History Social History   Tobacco Use  . Smoking status: Never Smoker  . Smokeless tobacco: Never Used  Vaping Use  . Vaping Use: Never used  Substance Use Topics  . Alcohol use: Never  . Drug use: Never     Allergies   Amoxicillin   Review of Systems Review  of Systems  Constitutional: Positive for chills. Negative for activity change, appetite change and fever.  HENT: Positive for congestion, hearing loss and rhinorrhea. Negative for sore throat.   Respiratory: Positive for cough and shortness of breath. Negative for wheezing.   Gastrointestinal: Negative for diarrhea, nausea and vomiting.  Musculoskeletal: Positive for arthralgias and myalgias.  Skin: Negative for rash.  Neurological: Positive for dizziness and headaches.  Hematological: Negative.   Psychiatric/Behavioral: Negative.      Physical Exam Triage Vital Signs ED Triage Vitals  Enc Vitals Group     BP 12/15/20 1511 120/77     Pulse Rate 12/15/20 1511 80     Resp 12/15/20 1511 16     Temp 12/15/20 1511 97.9 F (36.6 C)     Temp Source 12/15/20 1511 Oral     SpO2 12/15/20 1511 100 %     Weight 12/15/20 1507 240 lb (108.9 kg)     Height --      Head Circumference --      Peak Flow --      Pain Score 12/15/20 1507 6     Pain Loc --      Pain Edu? --      Excl. in GC? --    No data found.  Updated Vital Signs BP 120/77 (BP Location: Right Arm)   Pulse 80   Temp 97.9 F (36.6 C) (Oral)   Resp 16   Wt 240 lb (108.9 kg)   SpO2 100%   BMI 42.51 kg/m   Visual Acuity Right Eye Distance:   Left Eye Distance:   Bilateral Distance:    Right Eye Near:   Left Eye Near:    Bilateral Near:     Physical Exam Vitals and nursing note reviewed.  Constitutional:      General: She is not in acute distress.    Appearance: Normal appearance. She is well-developed. She is not toxic-appearing.  HENT:     Head: Normocephalic and atraumatic.     Right Ear: Tympanic membrane, ear canal and external ear normal.     Left Ear: Tympanic membrane, ear canal and external ear normal.     Nose: Congestion and rhinorrhea present.     Comments: Mucosa is erythematous and edematous with clear nasal discharge.    Mouth/Throat:     Mouth: Mucous membranes are moist.     Pharynx:  Oropharynx is clear. No oropharyngeal exudate or posterior oropharyngeal erythema.  Cardiovascular:     Rate and Rhythm: Normal rate.     Pulses: Normal pulses.     Heart sounds: Normal heart sounds. No murmur heard. No gallop.   Pulmonary:     Effort: Pulmonary effort is normal.     Breath sounds:  Normal breath sounds. No wheezing, rhonchi or rales.  Musculoskeletal:     Cervical back: Normal range of motion and neck supple.  Lymphadenopathy:     Cervical: No cervical adenopathy.  Skin:    General: Skin is warm and dry.     Capillary Refill: Capillary refill takes less than 2 seconds.     Findings: No erythema or rash.  Neurological:     General: No focal deficit present.     Mental Status: She is alert and oriented to person, place, and time.  Psychiatric:        Mood and Affect: Mood normal.        Behavior: Behavior normal.        Thought Content: Thought content normal.        Judgment: Judgment normal.      UC Treatments / Results  Labs (all labs ordered are listed, but only abnormal results are displayed) Labs Reviewed  SARS CORONAVIRUS 2 (TAT 6-24 HRS)    EKG   Radiology No results found.  Procedures Procedures (including critical care time)  Medications Ordered in UC Medications - No data to display  Initial Impression / Assessment and Plan / UC Course  I have reviewed the triage vital signs and the nursing notes.  Pertinent labs & imaging results that were available during my care of the patient were reviewed by me and considered in my medical decision making (see chart for details).   Patient is here for evaluation of COVID-like symptoms that began this morning.  Patient is nontoxic in appearance and in no acute distress.  Will swab patient for COVID and discharged home to isolate pending the results.  Will give patient Tessalon Perles and Promethazine DM cough syrup for cough and congestion.  Patient to use over-the-counter Tylenol and ibuprofen as  needed for fever and body aches.   Final Clinical Impressions(s) / UC Diagnoses   Final diagnoses:  Viral URI with cough     Discharge Instructions     Isolate until the results of your COVID test are back.  If you are positive then you will need to quarantine for 5 days from when your symptoms started.  After 5 days you can break quarantine if your symptoms have improved and you have not had a fever for 24 hours without taking Tylenol or ibuprofen.  After quarantine you must mask around other people for at least 5 days.  Use Tylenol and ibuprofen as needed for fever and pain.  Use the Tessalon Perles during the day as needed for cough and the Promethazine DM at bedtime as needed for cough and congestion.  If you develop increasing shortness of breath-especially at rest, are unable to speak in full sentences, or you develop a bluish hue to your lips you to go the ER for evaluation.    ED Prescriptions    Medication Sig Dispense Auth. Provider   benzonatate (TESSALON) 100 MG capsule Take 2 capsules (200 mg total) by mouth every 8 (eight) hours. 21 capsule Becky Augusta, NP   promethazine-dextromethorphan (PROMETHAZINE-DM) 6.25-15 MG/5ML syrup Take 5 mLs by mouth 4 (four) times daily as needed. 118 mL Becky Augusta, NP     PDMP not reviewed this encounter.   Becky Augusta, NP 12/15/20 1540

## 2020-12-15 NOTE — Discharge Instructions (Addendum)
Isolate until the results of your COVID test are back.  If you are positive then you will need to quarantine for 5 days from when your symptoms started.  After 5 days you can break quarantine if your symptoms have improved and you have not had a fever for 24 hours without taking Tylenol or ibuprofen.  After quarantine you must mask around other people for at least 5 days.  Use Tylenol and ibuprofen as needed for fever and pain.  Use the Tessalon Perles during the day as needed for cough and the Promethazine DM at bedtime as needed for cough and congestion.  If you develop increasing shortness of breath-especially at rest, are unable to speak in full sentences, or you develop a bluish hue to your lips you to go the ER for evaluation.

## 2020-12-16 LAB — SARS CORONAVIRUS 2 (TAT 6-24 HRS): SARS Coronavirus 2: NEGATIVE

## 2020-12-19 ENCOUNTER — Other Ambulatory Visit: Payer: Self-pay

## 2020-12-19 ENCOUNTER — Ambulatory Visit
Admission: EM | Admit: 2020-12-19 | Discharge: 2020-12-19 | Disposition: A | Discharge status: Home / Self Care | Payer: Medicaid Other | Attending: Family Medicine | Admitting: Family Medicine

## 2020-12-19 ENCOUNTER — Encounter: Payer: Self-pay | Admitting: Emergency Medicine

## 2020-12-19 DIAGNOSIS — J069 Acute upper respiratory infection, unspecified: Secondary | ICD-10-CM | POA: Diagnosis not present

## 2020-12-19 DIAGNOSIS — J029 Acute pharyngitis, unspecified: Secondary | ICD-10-CM | POA: Diagnosis not present

## 2020-12-19 DIAGNOSIS — Z20822 Contact with and (suspected) exposure to covid-19: Secondary | ICD-10-CM | POA: Diagnosis not present

## 2020-12-19 DIAGNOSIS — R059 Cough, unspecified: Secondary | ICD-10-CM | POA: Diagnosis present

## 2020-12-19 NOTE — Discharge Instructions (Addendum)
Continue the Tessalon Perles and Promethazine DM that you're prescribed earlier for cough and congestion.  Isolate at home until the results of your COVID test are back. If you're positive you'll have to quarantine for 5 days. You can break quarantine after 5 days if your symptoms have improved and you have not had a fever.

## 2020-12-19 NOTE — ED Triage Notes (Signed)
Patient c/o cough, headache and chills that started this morning.  Patient states that her boyfriend has covid.  Patient denies fevers.

## 2020-12-19 NOTE — ED Provider Notes (Signed)
[facility]    CSN: [id number] Arrival date & time: 12/19/20  1508      History   Chief Complaint Chief Complaint  Patient presents with  . Cough  . Fatigue    HPI Catherine [name] is a 20 y.o. female.   HPI   51-year-old female here for reevaluation of cough, headache, chills, and sore throat. Patient was evaluated in this urgent care 4 days ago and was negative for COVID but she reports that she was around her boyfriend the last couple of days who tested positive this morning for COVID and she states that her symptoms have worsened.  Past Medical History:  Diagnosis Date  . Allergy   . Asthma    past hx  . Migraine     Patient Active Problem List   Diagnosis Date Noted  . Pelvic pain 2020/04/23  . Right lower quadrant pain 04-23-2020  . Vitamin D insufficiency 23-Apr-2020  . Menorrhagia with regular cycle 2020/04/23  . Body mass index (BMI) of 40.1-44.9 in adult (HCC) 04/23/20  . Polyuria 04/23/2020  . History of anemia 04/23/20  . Weight gain 23-Apr-2020  . Annual physical exam 04/23/20  . Family history of brain aneurysm- father died around age 36  Apr 23, 2020  . High risk heterosexual behavior 23-Apr-2020  . Migraine with aura and without status migrainosus, not intractable 04/23/2020  . Acute non intractable tension-type headache 08/14/2018  . OVERWEIGHT 10/22/2010  . Allergic rhinitis 04/01/2010  . ASTHMA, INTERMITTENT 02/01/2007    Past Surgical History:  Procedure Laterality Date  . TONSILLECTOMY Bilateral 08/11/2015   Procedure: CONTROL POST TONSILLECTOMY BLEEDING ;  Surgeon: Vernie Murders, MD;  Location: [facility];  Service: ENT;  Laterality: Bilateral;  . TONSILLECTOMY AND ADENOIDECTOMY N/A 07/31/2015   Procedure: TONSILLECTOMY AND ADENOIDECTOMY;  Surgeon: Linus Salmons, MD;  Location: [facility];  Service: ENT;  Laterality: N/A;    OB History    Gravida  0   Para  0   Term  0   Preterm  0   AB  0   Living  0     SAB   0   IAB  0   Ectopic  0   Multiple  0   Live Births  0            Home Medications    Prior to Admission medications   Medication Sig Start Date End Date Taking? Authorizing Provider  benzonatate (TESSALON) 100 MG capsule Take 2 capsules (200 mg total) by mouth every 8 (eight) hours. 12/15/20   Becky Augusta, NP  ibuprofen (ADVIL) 200 MG tablet Take 200 mg by mouth every 6 (six) hours as needed.    [provider]  Multiple Vitamins-Minerals (MULTIVITAMIN WITH MINERALS) tablet Take 1 tablet by mouth daily. Patient not taking: Reported on 08/25/2020 06/05/20   Federico Flake, MD  promethazine-dextromethorphan (PROMETHAZINE-DM) 6.25-15 MG/5ML syrup Take 5 mLs by mouth 4 (four) times daily as needed. 12/15/20   Becky Augusta, NP  Vitamin D, Ergocalciferol, (DRISDOL) 1.25 MG (50000 UNIT) CAPS capsule Take 1 capsule (50,000 Units total) by mouth every 7 (seven) days. Patient not taking: Reported on 08/25/2020 04/08/20   Flinchum, Eula Fried, FNP    Family History Family History  Problem Relation Age of Onset  . Hypertension Father   . Aneurysm Father   . Hypertension Brother   . Diabetes Brother   . Breast cancer Maternal Grandmother   . Cancer Maternal Grandmother   .  Lung disease Maternal Grandmother   . Bipolar disorder Mother   . Depression Mother   . Cancer Mother   . Schizophrenia Mother   . Aneurysm Paternal Grandfather   . Heart attack Maternal Grandfather     Social History Social History   Tobacco Use  . Smoking status: Never Smoker  . Smokeless tobacco: Never Used  Vaping Use  . Vaping Use: Never used  Substance Use Topics  . Alcohol use: Never  . Drug use: Never     Allergies   Amoxicillin   Review of Systems Review of Systems  Constitutional: Negative for activity change, appetite change and fever.  HENT: Positive for congestion, rhinorrhea and sore throat. Negative for ear pain.   Respiratory: Positive for cough.    Gastrointestinal: Negative for nausea and vomiting.  Musculoskeletal: Positive for arthralgias and myalgias.  Skin: Negative for rash.  Neurological: Positive for headaches.  Hematological: Negative.   Psychiatric/Behavioral: Negative.      Physical Exam Triage Vital Signs ED Triage Vitals  Enc Vitals Group     BP 12/19/20 1537 131/84     Pulse Rate 12/19/20 1537 80     Resp 12/19/20 1537 14     Temp 12/19/20 1537 98.2 F (36.8 C)     Temp Source 12/19/20 1537 Oral     SpO2 12/19/20 1537 100 %     Weight 12/19/20 1534 240 lb (108.9 kg)     Height 12/19/20 1534 5\' 3"  (1.6 m)     Head Circumference --      Peak Flow --      Pain Score 12/19/20 1534 7     Pain Loc --      Pain Edu? --      Excl. in GC? --    No data found.  Updated Vital Signs BP 131/84 (BP Location: Right Arm)   Pulse 80   Temp 98.2 F (36.8 C) (Oral)   Resp 14   Ht 5\' 3"  (1.6 m)   Wt 240 lb (108.9 kg)   SpO2 100%   BMI 42.51 kg/m   Visual Acuity Right Eye Distance:   Left Eye Distance:   Bilateral Distance:    Right Eye Near:   Left Eye Near:    Bilateral Near:     Physical Exam Vitals and nursing note reviewed.  Constitutional:      General: She is not in acute distress.    Appearance: Normal appearance. She is obese. She is not toxic-appearing.  HENT:     Head: Normocephalic and atraumatic.     Right Ear: Tympanic membrane, ear canal and external ear normal.     Left Ear: Tympanic membrane, ear canal and external ear normal.     Nose: Congestion and rhinorrhea present.     Comments: His mucosa is mildly erythematous and edematous with scant clear nasal discharge.    Mouth/Throat:     Mouth: Mucous membranes are moist.     Pharynx: Oropharynx is clear. Posterior oropharyngeal erythema present. No oropharyngeal exudate.     Comments: Steer oropharynx has mild erythema and clear postnasal drip. Tonsillar pillars are unremarkable. Cardiovascular:     Rate and Rhythm: Normal rate and  regular rhythm.     Pulses: Normal pulses.     Heart sounds: Normal heart sounds. No murmur heard. No gallop.   Pulmonary:     Effort: Pulmonary effort is normal.     Breath sounds: Normal breath sounds. No wheezing, rhonchi or  rales.  Musculoskeletal:     Cervical back: Normal range of motion and neck supple.  Lymphadenopathy:     Cervical: No cervical adenopathy.  Skin:    General: Skin is warm and dry.     Capillary Refill: Capillary refill takes less than 2 seconds.     Findings: No erythema or rash.  Neurological:     General: No focal deficit present.     Mental Status: She is alert and oriented to person, place, and time.  Psychiatric:        Mood and Affect: Mood normal.        Behavior: Behavior normal.        Thought Content: Thought content normal.        Judgment: Judgment normal.      UC Treatments / Results  Labs (all labs ordered are listed, but only abnormal results are displayed) Labs Reviewed  SARS CORONAVIRUS 2 (TAT 6-24 HRS)    EKG   Radiology No results found.  Procedures Procedures (including critical care time)  Medications Ordered in UC Medications - No data to display  Initial Impression / Assessment and Plan / UC Course  I have reviewed the triage vital signs and the nursing notes.  Pertinent labs & imaging results that were available during my care of the patient were reviewed by me and considered in my medical decision making (see chart for details).   Patient is return for reevaluation of COVID-like symptoms. Patient was evaluated 4 days ago and had a negative COVID test but she reports that she was around her boyfriend for the last couple of days who tested positive this morning. She states that her symptoms feel worse in the manner of cough, headache, chills, and sore throat. Will we swab patient for COVID to have her isolate until the results are back. Patient states she still has Lawyer and Promethazine DM at home for cough  that she was prescribed last time. We'll have her continue those medications.   Final Clinical Impressions(s) / UC Diagnoses   Final diagnoses:  Viral URI with cough     Discharge Instructions     Continue the Tessalon Perles and Promethazine DM that you're prescribed earlier for cough and congestion.  Isolate at home until the results of your COVID test are back. If you're positive you'll have to quarantine for 5 days. You can break quarantine after 5 days if your symptoms have improved and you have not had a fever.    ED Prescriptions    None     PDMP not reviewed this encounter.   Becky Augusta, NP 12/19/20 1601

## 2020-12-20 LAB — SARS CORONAVIRUS 2 (TAT 6-24 HRS): SARS Coronavirus 2: NEGATIVE

## 2021-01-14 ENCOUNTER — Other Ambulatory Visit: Payer: Self-pay

## 2021-01-14 ENCOUNTER — Ambulatory Visit (LOCAL_COMMUNITY_HEALTH_CENTER): Payer: Medicaid Other | Admitting: Physician Assistant

## 2021-01-14 VITALS — BP 135/87 | Ht 62.0 in | Wt 249.0 lb

## 2021-01-14 DIAGNOSIS — Z3009 Encounter for other general counseling and advice on contraception: Secondary | ICD-10-CM

## 2021-01-14 NOTE — Progress Notes (Signed)
Patient reports Sharp pain on left side, stomach pain cramps, pain during sex, heavy spotting, vaginal discharge increased over the past few days. Denies trouble finding strings.   Patient has Paragard.  Harvie Heck

## 2021-01-16 NOTE — Progress Notes (Signed)
[facility]  Subjective:  Catherine [name] is a 20 y.o. being seen today for an IUD check.  Chief Complaint  Patient presents with  . Contraception    Check strings , reports pain with IUD    HPI Patient into clinic due to pain that she thinks may be related to her IUD.  Reports that she has had Paragard for about 6 months.  Reports that she has had cramping off and on since insertion but they have been "bad" for about 1 week, sharp pains  along her left side for 2 days, low mid back pain in sacral area for a few days, pain with sex each time for about 3 months and increased amount of normal looking discharge for a few days.  Denies nausea, vomiting, fever, chills, change to bladder or bowel function or trouble feeling strings.  Patient states that she strings have not felt longer or shorter to her partner who usually checks for them since she has trouble herself finding them.  Patient states that she would like to continue with the IUD as long as it appears to be in place.  Declines screening for infections today and requests IUD string check only.   Does the patient have a current or past history of drug use? No   No components found for: HCV]   Health Maintenance Due  Topic Date Due  . COVID-19 Vaccine (1) Never done  . INFLUENZA VACCINE  Never done  . TETANUS/TDAP  Never done    Review of Systems  All other systems reviewed and are negative.   The following portions of the patient's history were reviewed and updated as appropriate: allergies, current medications, past family history, past medical history, past social history, past surgical history and problem list. Problem list updated.   See flowsheet for other program required questions.  Objective:   Vitals:   01/14/21 1429  BP: 135/87  Weight: 249 lb (112.9 kg)  Height: 5\' 2"  (1.575 m)    Physical Exam Vitals and nursing note reviewed.  Constitutional:       General: She is not in acute distress.    Appearance: Normal appearance.  HENT:     Head: Normocephalic and atraumatic.  Eyes:     Conjunctiva/sclera: Conjunctivae normal.  Pulmonary:     Effort: Pulmonary effort is normal.  Abdominal:     Palpations: Abdomen is soft. There is no mass.     Tenderness: There is no abdominal tenderness. There is no right CVA tenderness, left CVA tenderness, guarding or rebound.     Comments: Slight tenderness to palpation in lower mid sacral area, likely muscular.  Genitourinary:    General: Normal vulva.     Rectum: Normal.     Comments: External genitalia/pubic area without nits, lice, edema, erythema, lesions and inguinal adenopathy. Vagina with normal mucosa and discharge. Cervix without visible lesions.  IUD strings visualized and palpated at approximately 3 cm. Uterus firm, mobile, nt, no masses, no CMT, no adnexal tenderness or fullness. Skin:    General: Skin is warm and dry.     Findings: No bruising, erythema, lesion or rash.  Neurological:     Mental Status: She is alert and oriented to person, place, and time.  Psychiatric:        Mood and Affect: Mood normal.        Behavior: Behavior normal.        Thought Content: Thought  content normal.        Judgment: Judgment normal.       Assessment and Plan:  Catherine [name] is a 19 y.o. female presenting to the [facility] for a Women's Health problem visit  1. Encounter for counseling regarding contraception Counseled patient re: normal SE after getting an IUD can include worse cramping with and heavier periods, especially with the Paragard. Counseled patient that we can screen for infection at any time if she changes her mind. Counseled patient that it is possible that the IUD has shifted slightly its position in there uterus and this may be the reason for her discomfort. Enc patient to go to ER or urgent care if her symptoms become worse, starts having a fever,  nausea, vomiting. Counseled patient that if her symptoms do not resolve and she decides on IUD removal, she can RTC at any time to have this done and discuss other options for birth control. Patient verbalizes understanding of above and agrees with plan.     No follow-ups on file.  No future appointments.  Matt Holmes, PA

## 2021-02-17 ENCOUNTER — Other Ambulatory Visit: Payer: Self-pay

## 2021-02-17 ENCOUNTER — Ambulatory Visit
Admission: EM | Admit: 2021-02-17 | Discharge: 2021-02-17 | Disposition: A | Discharge status: Home / Self Care | Payer: Medicaid Other | Attending: Sports Medicine | Admitting: Sports Medicine

## 2021-02-17 DIAGNOSIS — S0990XA Unspecified injury of head, initial encounter: Secondary | ICD-10-CM

## 2021-02-17 DIAGNOSIS — G44319 Acute post-traumatic headache, not intractable: Secondary | ICD-10-CM

## 2021-02-17 DIAGNOSIS — S060X0A Concussion without loss of consciousness, initial encounter: Secondary | ICD-10-CM

## 2021-02-17 NOTE — ED Provider Notes (Signed)
[facility]    CSN: [id number] Arrival date & time: 02/17/21  1637      History   Chief Complaint Chief Complaint  Patient presents with  . Head Injury    HPI Catherine [name] is a 20 y.o. female.   Patient pleasant 20 year old female who presents for evaluation of the above issues.  She reports she was trying to get her dog into the car to go to the vet and the dog hit her awkwardly and she hit the left side of her head onto the door.  She then hit the right side of her head on the other side of the car.  She did not lose consciousness.  No bleeding or significant laceration on her head.  She denies any significant headache.  She says she has a little discomfort at the site where she sustained the injury.  No vision changes balance problems.  No vomiting or diarrhea.  She reports that she was a little dizzy at the time of the injury.  Wanted to be evaluated.  She did not drive here, her boyfriend brought her.  No fever shakes chills.  No recent Covid exposure.  She was on her way to work after taking the dog to the vet.  She works over at [employer].  She felt it was not good to go to work and came into the urgent care for initial evaluation.  She reports that she feels "a little off".  She is not on anticoagulants.  She reports no significant head trauma in her past medical history.  She also reports she does not have a primary care physician.  No red flag signs or symptoms elicited on history.     Past Medical History:  Diagnosis Date  . Allergy   . Asthma    past hx  . Migraine     Patient Active Problem List   Diagnosis Date Noted  . Pelvic pain 02-May-2020  . Right lower quadrant pain May 02, 2020  . Vitamin D insufficiency 05-02-20  . Menorrhagia with regular cycle May 02, 2020  . Body mass index (BMI) of 40.1-44.9 in adult (HCC) 05-02-20  . Polyuria May 02, 2020  . History of anemia May 02, 2020  . Weight gain 05-02-2020  . Annual physical exam 05-02-2020   . Family history of brain aneurysm- father died around age 49  2020/05/02  . High risk heterosexual behavior 2020/05/02  . Migraine with aura and without status migrainosus, not intractable May 02, 2020  . Acute non intractable tension-type headache 08/14/2018  . OVERWEIGHT 10/22/2010  . Allergic rhinitis 04/01/2010  . ASTHMA, INTERMITTENT 02/01/2007    Past Surgical History:  Procedure Laterality Date  . TONSILLECTOMY Bilateral 08/11/2015   Procedure: CONTROL POST TONSILLECTOMY BLEEDING ;  Surgeon: Vernie Murders, MD;  Location: [facility];  Service: ENT;  Laterality: Bilateral;  . TONSILLECTOMY AND ADENOIDECTOMY N/A 07/31/2015   Procedure: TONSILLECTOMY AND ADENOIDECTOMY;  Surgeon: Linus Salmons, MD;  Location: [facility];  Service: ENT;  Laterality: N/A;    OB History    Gravida  0   Para  0   Term  0   Preterm  0   AB  0   Living  0     SAB  0   IAB  0   Ectopic  0   Multiple  0   Live Births  0            Home Medications    Prior to Admission medications   Medication Sig  Start Date End Date Taking? Authorizing Provider  ibuprofen (ADVIL) 200 MG tablet Take 200 mg by mouth every 6 (six) hours as needed.   Yes [provider]  Multiple Vitamins-Minerals (MULTIVITAMIN WITH MINERALS) tablet Take 1 tablet by mouth daily. 06/05/20  Yes Federico Flake, MD  Vitamin D, Ergocalciferol, (DRISDOL) 1.25 MG (50000 UNIT) CAPS capsule Take 1 capsule (50,000 Units total) by mouth every 7 (seven) days. 04/08/20  Yes Flinchum, Eula Fried, FNP  benzonatate (TESSALON) 100 MG capsule Take 2 capsules (200 mg total) by mouth every 8 (eight) hours. Patient not taking: No sig reported 12/15/20   Becky Augusta, NP  promethazine-dextromethorphan (PROMETHAZINE-DM) 6.25-15 MG/5ML syrup Take 5 mLs by mouth 4 (four) times daily as needed. Patient not taking: No sig reported 12/15/20   Becky Augusta, NP    Family History Family History  Problem Relation Age of Onset   . Hypertension Father   . Aneurysm Father   . Hypertension Brother   . Diabetes Brother   . Breast cancer Maternal Grandmother   . Cancer Maternal Grandmother   . Lung disease Maternal Grandmother   . Bipolar disorder Mother   . Depression Mother   . Cancer Mother   . Schizophrenia Mother   . Aneurysm Paternal Grandfather   . Heart attack Maternal Grandfather     Social History Social History   Tobacco Use  . Smoking status: Never Smoker  . Smokeless tobacco: Never Used  Vaping Use  . Vaping Use: Never used  Substance Use Topics  . Alcohol use: Never  . Drug use: Never     Allergies   Amoxicillin   Review of Systems Review of Systems  Constitutional: Negative.  Negative for activity change, appetite change, chills, diaphoresis, fatigue and fever.  HENT: Negative.  Negative for congestion, ear discharge, ear pain, rhinorrhea, sinus pressure, sinus pain, sneezing and sore throat.   Eyes: Negative.  Negative for photophobia, pain and visual disturbance.  Respiratory: Negative.  Negative for cough, choking, chest tightness, shortness of breath and wheezing.   Cardiovascular: Negative.  Negative for chest pain and palpitations.  Gastrointestinal: Positive for nausea. Negative for abdominal pain, constipation, diarrhea and vomiting.  Genitourinary: Negative.  Negative for dysuria.  Musculoskeletal: Negative for arthralgias, back pain, joint swelling and myalgias.  Skin: Negative for color change and rash.  Neurological: Positive for headaches. Negative for dizziness, tremors, seizures, syncope, facial asymmetry, speech difficulty, weakness, light-headedness and numbness.  All other systems reviewed and are negative.    Physical Exam Triage Vital Signs ED Triage Vitals  Enc Vitals Group     BP 02/17/21 1649 (!) 151/96     Pulse Rate 02/17/21 1649 87     Resp 02/17/21 1649 18     Temp 02/17/21 1649 98.1 F (36.7 C)     Temp Source 02/17/21 1649 Oral     SpO2  02/17/21 1649 100 %     Weight 02/17/21 1648 245 lb (111.1 kg)     Height 02/17/21 1648 5\' 3"  (1.6 m)     Head Circumference --      Peak Flow --      Pain Score 02/17/21 1647 8     Pain Loc --      Pain Edu? --      Excl. in GC? --    No data found.  Updated Vital Signs BP (!) 151/96 (BP Location: Right Arm)   Pulse 87   Temp 98.1 F (36.7 C) (Oral)  Resp 18   Ht  (1.6 m)   Wt 111.1 kg   SpO2 100%   BMI 43.40 kg/m   Visual Acuity Right Eye Distance:   Left Eye Distance:   Bilateral Distance:    Right Eye Near:   Left Eye Near:    Bilateral Near:     Physical Exam Vitals and nursing note reviewed.  Constitutional:      General: She is not in acute distress.    Appearance: Normal appearance. She is not ill-appearing, toxic-appearing or diaphoretic.  HENT:     Head: Normocephalic and atraumatic.     Right Ear: Tympanic membrane normal.     Left Ear: Tympanic membrane normal.     Nose: Nose normal.     Mouth/Throat:     Mouth: Mucous membranes are moist.     Pharynx: No oropharyngeal exudate or posterior oropharyngeal erythema.  Eyes:     General: No scleral icterus.       Right eye: No discharge.        Left eye: No discharge.     Conjunctiva/sclera: Conjunctivae normal.     Pupils: Pupils are equal, round, and reactive to light.  Cardiovascular:     Rate and Rhythm: Normal rate and regular rhythm.     Pulses: Normal pulses.     Heart sounds: Normal heart sounds. No murmur heard. No friction rub. No gallop.   Pulmonary:     Effort: Pulmonary effort is normal. No respiratory distress.     Breath sounds: Normal breath sounds. No stridor. No wheezing, rhonchi or rales.  Musculoskeletal:     Cervical back: Normal range of motion and neck supple. No rigidity or tenderness.  Skin:    General: Skin is warm and dry.     Capillary Refill: Capillary refill takes less than 2 seconds.     Findings: No rash.  Neurological:     General: No focal deficit  present.     Mental Status: She is alert and oriented to person, place, and time.     GCS: GCS eye subscore is 4. GCS verbal subscore is 5. GCS motor subscore is 6.     Cranial Nerves: Cranial nerves are intact. No cranial nerve deficit, dysarthria or facial asymmetry.     Sensory: Sensation is intact. No sensory deficit.     Motor: No weakness, tremor or seizure activity.     Coordination: Coordination is intact. Romberg sign negative. Coordination normal.     Gait: Gait and tandem walk normal.     Deep Tendon Reflexes: Reflexes are normal and symmetric. Reflexes normal.     Reflex Scores:      Tricep reflexes are 2+ on the right side and 2+ on the left side.      Bicep reflexes are 2+ on the right side and 2+ on the left side.      Brachioradialis reflexes are 2+ on the right side and 2+ on the left side.      Patellar reflexes are 2+ on the right side and 2+ on the left side.      Achilles reflexes are 2+ on the right side and 2+ on the left side.    Comments: Entire neurological exam is nonfocal.      UC Treatments / Results  Labs (all labs ordered are listed, but only abnormal results are displayed) Labs Reviewed - No data to display  EKG   Radiology No results found.  Procedures Procedures (including  critical care time)  Medications Ordered in UC Medications - No data to display  Initial Impression / Assessment and Plan / UC Course  I have reviewed the triage vital signs and the nursing notes.  Pertinent labs & imaging results that were available during my care of the patient were reviewed by me and considered in my medical decision making (see chart for details).  Clinical impression: Closed head injury.  Patient may well have a mild concussion.  Her neurological exam is completely normal.  Is grossly nonfocal.  Treatment plan: 1.  The findings and treatment plan were discussed in detail with the patient.  Patient was in agreement. 2.  I reassured her that I did  not feel there was anything that warranted a higher level of care at the present time.  Her exam is normal. 3.  I did give her a work note stating that she was seen today and that she can go back tomorrow. 4.  She can take Tylenol for her head discomfort.  No Motrin, Advil, ibuprofen, Naprosyn, or Aleve, or any aspirin products. 5.  Educational handouts were provided on head injury and concussion. 6.  If her symptoms were to worsen in any way.  She started to vomit, or had any neurological changes including worsening headache then she needed to call 911 or go to the emergency room. 7.  She does not have a primary care physician and I did give her some information on how to obtain one. 8.  We will see her back as needed.    Final Clinical Impressions(s) / UC Diagnoses   Final diagnoses:  Closed head injury, initial encounter  Acute post-traumatic headache, not intractable  Concussion without loss of consciousness, initial encounter     Discharge Instructions     Your neurological exam is reassuring.  No need for advanced imaging at this time. You can take Tylenol only for any head discomfort.  Do not take any Motrin, Advil, ibuprofen, Naprosyn, Aleve, or any aspirin products. I did give you an educational handout on head injury and concussion.  If you develop any of the symptoms then please call 911 or go to the emergency room. 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  Again if your symptoms change in any way or you worsen then please go to the ER.    ED Prescriptions    None     PDMP not reviewed this encounter.   Delton See, MD 02/21/21 [phone]

## 2021-02-17 NOTE — ED Triage Notes (Signed)
Patient states that around 1pm today she slammed her head in a car door by accident while trying to get her dog in the car to go to the vet. States that since she has been dizzy, nausea and sleepy.

## 2021-02-17 NOTE — Discharge Instructions (Signed)
Your neurological exam is reassuring.  No need for advanced imaging at this time. You can take Tylenol only for any head discomfort.  Do not take any Motrin, Advil, ibuprofen, Naprosyn, Aleve, or any aspirin products. I did give you an educational handout on head injury and concussion.  If you develop any of the symptoms then please call 911 or go to the emergency room. 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  Again if your symptoms change in any way or you worsen then please go to the ER.

## 2021-05-30 ENCOUNTER — Other Ambulatory Visit: Payer: Self-pay

## 2021-05-30 ENCOUNTER — Encounter: Payer: Self-pay | Admitting: Emergency Medicine

## 2021-05-30 ENCOUNTER — Ambulatory Visit
Admission: EM | Admit: 2021-05-30 | Discharge: 2021-05-30 | Disposition: A | Discharge status: Home / Self Care | Payer: Medicaid Other

## 2021-05-30 NOTE — ED Notes (Signed)
Patient is being discharged from the Urgent Care and sent to the Emergency Department via POV . Per Rodriguez-Southworth, Nettie Elm, patient is in need of higher level of care due to dizziness and bleeding for x3 weeks. Patient is aware and verbalizes understanding of plan of care.  Vitals:   05/30/21 1541  BP: (!) 134/95  Pulse: 96  Resp: 18  Temp: 98.8 F (37.1 C)  SpO2: 100%

## 2021-05-30 NOTE — ED Triage Notes (Signed)
Pt is present today with dizziness, weakness, slight HA, and feeling winded. Pt states that her sx started x1 week ago. Pt states that when she closes her eyes it feels like the room is spinning.

## 2021-05-30 NOTE — ED Provider Notes (Signed)
PT WAS NOT SEEN HERE DUE TO BLEEDING WHILE PREGNANT   Garey Ham, [place] 06/13/21 [phone]

## 2021-07-15 ENCOUNTER — Telehealth: Payer: Self-pay | Admitting: Family Medicine

## 2021-07-15 NOTE — Telephone Encounter (Signed)
Pt states that she has been bleeding "since her last visit," which was in February and for the last 1 1/2 weeks, she has had abdominal pain and excessive bleeding.  She states that she has to change "my pad every hour."

## 2021-07-29 ENCOUNTER — Encounter: Payer: Self-pay | Admitting: Physician Assistant

## 2021-07-29 ENCOUNTER — Ambulatory Visit: Payer: Self-pay

## 2021-07-29 ENCOUNTER — Ambulatory Visit (LOCAL_COMMUNITY_HEALTH_CENTER): Payer: Medicaid Other | Admitting: Physician Assistant

## 2021-07-29 ENCOUNTER — Other Ambulatory Visit: Payer: Self-pay

## 2021-07-29 VITALS — BP 126/85 | Ht 63.0 in | Wt 250.0 lb

## 2021-07-29 DIAGNOSIS — Z3009 Encounter for other general counseling and advice on contraception: Secondary | ICD-10-CM | POA: Diagnosis not present

## 2021-07-29 DIAGNOSIS — Z30432 Encounter for removal of intrauterine contraceptive device: Secondary | ICD-10-CM | POA: Diagnosis not present

## 2021-07-29 NOTE — Progress Notes (Signed)
[facility]  Subjective:  Catherine [name] is a 20 y.o. being seen today for IUD removal.  Chief Complaint  Patient presents with   Contraception    IUD removal    HPI Patient is here for her IUD to be removed.  Reports that she has had a lot of irregular bleeding since her insertion almost 1 year ago.  Reports that for the last 4-6 weeks the bleeding has been heavier and also she has had a lot of bad cramping.  Denies other vaginal symptoms, changes to bowel or bladder function.  Declines STD screening stating that she has not had sex recently.  States she plans abstinence after IUD removal.  Does the patient have a current or past history of drug use? No   No components found for: HCV]   Health Maintenance Due  Topic Date Due   COVID-19 Vaccine (1) Never done   Pneumococcal Vaccine 9-109 Years old (1 - PCV) Never done   HPV VACCINES (1 - 2-dose series) Never done   TETANUS/TDAP  Never done   CHLAMYDIA SCREENING  04/03/2021   INFLUENZA VACCINE  07/05/2021    Review of Systems  All other systems reviewed and are negative.  The following portions of the patient's history were reviewed and updated as appropriate: allergies, current medications, past family history, past medical history, past social history, past surgical history and problem list. Problem list updated.   See flowsheet for other program required questions.  Objective:   Vitals:   07/29/21 0857  BP: 126/85  Weight: 250 lb (113.4 kg)  Height: 5\' 3"  (1.6 m)    Physical Exam Constitutional:      General: She is not in acute distress.    Appearance: Normal appearance.  HENT:     Head: Normocephalic and atraumatic.     Mouth/Throat:     Mouth: Mucous membranes are moist.     Pharynx: Oropharynx is clear. No oropharyngeal exudate or posterior oropharyngeal erythema.  Eyes:     Conjunctiva/sclera: Conjunctivae normal.  Pulmonary:     Effort:  Pulmonary effort is normal.  Abdominal:     Palpations: Abdomen is soft. There is no mass.     Tenderness: There is no abdominal tenderness. There is no guarding or rebound.  Genitourinary:    General: Normal vulva.     Rectum: Normal.     Comments: External genitalia/pubic area without nits, lice, edema, erythema, lesions and inguinal adenopathy. Vagina with normal mucosa and moderate amount of menstrual blood present. Cervix without visible lesions.  IUD strings visualized. Uterus firm, mobile, nt, no masses, no CMT, no adnexal tenderness or fullness.  Musculoskeletal:     Cervical back: Neck supple. No tenderness.  Skin:    General: Skin is warm and dry.     Findings: No bruising, erythema, lesion or rash.  Neurological:     Mental Status: She is alert and oriented to person, place, and time.  Psychiatric:        Mood and Affect: Mood normal.        Behavior: Behavior normal.        Thought Content: Thought content normal.        Judgment: Judgment normal.      Assessment and Plan:  Catherine [name] is a 20 y.o. female presenting to the [facility] for a Women's Health problem visit  1. Encounter for counseling regarding contraception Patient counseled re:  BCM options, both hormonal and non-hormonal. Patient states that she plans abstinence for "a while" and is aware that she can RTC at any time to discuss [facility] options. Enc condoms with all sex for STD and pregnancy prevention. Counseled patient that annual RP is due next month.  2. Encounter for IUD removal  IUD Removal  Patient identified, informed consent performed, consent signed.  Patient was in the dorsal lithotomy position, normal external genitalia was noted.  A speculum was placed in the patient's vagina, normal discharge was noted, no lesions. The cervix was visualized, no lesions, no abnormal discharge.  The strings of the IUD were grasped and pulled using ring forceps. The IUD was removed in its  entirety. Patient tolerated the procedure well.    Patient will use abstinence for contraception.  Routine preventative health maintenance measures emphasized.      Return in about 1 month (around 08/29/2021) for annual exam.  No future appointments.  Matt Holmes, PA

## 2021-11-30 ENCOUNTER — Other Ambulatory Visit: Payer: Self-pay

## 2021-11-30 ENCOUNTER — Encounter: Payer: Self-pay | Admitting: Licensed Clinical Social Worker

## 2021-11-30 ENCOUNTER — Ambulatory Visit
Admission: EM | Admit: 2021-11-30 | Discharge: 2021-11-30 | Disposition: A | Discharge status: Home / Self Care | Payer: Medicaid Other | Attending: Internal Medicine | Admitting: Internal Medicine

## 2021-11-30 DIAGNOSIS — H66003 Acute suppurative otitis media without spontaneous rupture of ear drum, bilateral: Secondary | ICD-10-CM | POA: Insufficient documentation

## 2021-11-30 DIAGNOSIS — J069 Acute upper respiratory infection, unspecified: Secondary | ICD-10-CM | POA: Insufficient documentation

## 2021-11-30 LAB — URINALYSIS, COMPLETE (UACMP) WITH MICROSCOPIC
Bilirubin Urine: NEGATIVE
Glucose, UA: NEGATIVE mg/dL
Hgb urine dipstick: NEGATIVE
Ketones, ur: NEGATIVE mg/dL
Leukocytes,Ua: NEGATIVE
Nitrite: NEGATIVE
Protein, ur: NEGATIVE mg/dL
Specific Gravity, Urine: >1.03 — ABNORMAL HIGH (ref 1.005–1.030)
pH: 5 (ref 5.0–8.0)

## 2021-11-30 LAB — PREGNANCY, URINE: Preg Test, Ur: NEGATIVE

## 2021-11-30 MED ORDER — DOXYCYCLINE HYCLATE 100 MG PO CAPS: 100.0000 mg | ORAL_CAPSULE | Freq: Two times a day (BID) | ORAL | 0 refills | Status: DC

## 2021-11-30 MED ORDER — PROMETHAZINE-DM 6.25-15 MG/5ML PO SYRP: 5.0000 mL | ORAL_SOLUTION | Freq: Four times a day (QID) | ORAL | 0 refills | Status: DC | PRN

## 2021-11-30 MED ORDER — IPRATROPIUM BROMIDE 0.06 % NA SOLN: 2.0000 | Freq: Four times a day (QID) | NASAL | 12 refills | Status: DC

## 2021-11-30 MED ORDER — BENZONATATE 100 MG PO CAPS: 200.0000 mg | ORAL_CAPSULE | Freq: Three times a day (TID) | ORAL | 0 refills | Status: DC

## 2021-11-30 NOTE — ED Triage Notes (Signed)
Pt c/o sore throat, ear pain, abdominal pain, hot flashes. Sxs x 3 days ago. Pt also wants a pregnancy test.

## 2021-11-30 NOTE — ED Provider Notes (Signed)
[facility]    CSN: [id number] Arrival date & time: 11/30/21  1511      History   Chief Complaint Chief Complaint  Patient presents with   Sore Throat   Generalized Body Aches    HPI Larena Ohnemus is a 20 y.o. female.   HPI  20 year old female here for evaluation of multiple complaints.  Patient reports that she has been experiencing runny nose, nasal congestion, bilateral ear pain, ringing in her ears, sore throat, nonproductive cough, intermittent shortness of breath, hot flashes, suprapubic abdominal pain, urinary urgency and frequency for the last 3 days.  She is also requesting a pregnancy test.  She denies fever, drainage from ears, wheezing, pain with urination, vaginal itching, burning, or discharge.  Past Medical History:  Diagnosis Date   Allergy    Asthma    past hx   Migraine     Patient Active Problem List   Diagnosis Date Noted   Pelvic pain 04-09-20   Right lower quadrant pain Apr 09, 2020   Vitamin D insufficiency 2020-04-09   Menorrhagia with regular cycle 2020/04/09   Body mass index (BMI) of 40.1-44.9 in adult [facility]) 04-09-20   Polyuria 04-09-20   History of anemia 04/09/2020   Weight gain 04-09-20   Annual physical exam 04/09/2020   Family history of brain aneurysm- father died around age 13  2020-04-09   High risk heterosexual behavior 2020-04-09   Migraine with aura and without status migrainosus, not intractable 09-Apr-2020   Acute non intractable tension-type headache 08/14/2018   OVERWEIGHT 10/22/2010   Allergic rhinitis 04/01/2010   ASTHMA, INTERMITTENT 02/01/2007    Past Surgical History:  Procedure Laterality Date   TONSILLECTOMY Bilateral 08/11/2015   Procedure: CONTROL POST TONSILLECTOMY BLEEDING ;  Surgeon: Vernie Murders, MD;  Location: [facility];  Service: ENT;  Laterality: Bilateral;   TONSILLECTOMY AND ADENOIDECTOMY N/A 07/31/2015   Procedure: TONSILLECTOMY AND ADENOIDECTOMY;  Surgeon: Linus Salmons, MD;   Location: [facility];  Service: ENT;  Laterality: N/A;    OB History     Gravida  0   Para  0   Term  0   Preterm  0   AB  0   Living  0      SAB  0   IAB  0   Ectopic  0   Multiple  0   Live Births  0            Home Medications    Prior to Admission medications   Medication Sig Start Date End Date Taking? Authorizing Provider  benzonatate (TESSALON) 100 MG capsule Take 2 capsules (200 mg total) by mouth every 8 (eight) hours. 11/30/21  Yes Becky Augusta, NP  doxycycline (VIBRAMYCIN) 100 MG capsule Take 1 capsule (100 mg total) by mouth 2 (two) times daily. 11/30/21  Yes Becky Augusta, NP  ipratropium (ATROVENT) 0.06 % nasal spray Place 2 sprays into both nostrils 4 (four) times daily. 11/30/21  Yes Becky Augusta, NP  meclizine (ANTIVERT) 25 MG tablet Take 25 mg by mouth 3 (three) times daily as needed for dizziness.   Yes [provider]  promethazine-dextromethorphan (PROMETHAZINE-DM) 6.25-15 MG/5ML syrup Take 5 mLs by mouth 4 (four) times daily as needed. 11/30/21  Yes Becky Augusta, NP  ibuprofen (ADVIL) 200 MG tablet Take 200 mg by mouth every 6 (six) hours as needed.    [provider]  Multiple Vitamins-Minerals (MULTIVITAMIN WITH MINERALS) tablet Take 1 tablet by mouth daily. Patient not taking: Reported  on 07/29/2021 06/05/20   Federico Flake, MD    Family History Family History  Problem Relation Age of Onset   Hypertension Father    Aneurysm Father    Hypertension Brother    Diabetes Brother    Breast cancer Maternal Grandmother    Cancer Maternal Grandmother    Lung disease Maternal Grandmother    Bipolar disorder Mother    Depression Mother    Cancer Mother    Schizophrenia Mother    Aneurysm Paternal Grandfather    Heart attack Maternal Grandfather     Social History Social History   Tobacco Use   Smoking status: Never   Smokeless tobacco: Never  Vaping Use   Vaping Use: Never used  Substance Use  Topics   Alcohol use: Never   Drug use: Never     Allergies   Amoxicillin   Review of Systems Review of Systems  Constitutional:  Negative for activity change, appetite change and fever.  HENT:  Positive for congestion, ear pain, rhinorrhea, sore throat and tinnitus. Negative for ear discharge.   Respiratory:  Positive for cough and shortness of breath. Negative for wheezing.   Gastrointestinal:  Positive for abdominal pain. Negative for nausea and vomiting.  Genitourinary:  Positive for frequency and urgency. Negative for dysuria, vaginal bleeding, vaginal discharge and vaginal pain.  Skin:  Negative for rash.  Hematological: Negative.   Psychiatric/Behavioral: Negative.      Physical Exam Triage Vital Signs ED Triage Vitals  Enc Vitals Group     BP --      Pulse --      Resp --      Temp --      Temp src --      SpO2 --      Weight 11/30/21 1632 250 lb (113.4 kg)     Height 11/30/21 1632 5\' 3"  (1.6 m)     Head Circumference --      Peak Flow --      Pain Score 11/30/21 1631 5     Pain Loc --      Pain Edu? --      Excl. in GC? --    No data found.  Updated Vital Signs BP (!) 127/93 (BP Location: Left Arm)    Pulse 77    Temp 97.7 F (36.5 C) (Oral)    Resp 16    Ht 5\' 3"  (1.6 m)    Wt 250 lb (113.4 kg)    LMP 10/30/2021 (Exact Date)    SpO2 100%    BMI 44.29 kg/m   Visual Acuity Right Eye Distance:   Left Eye Distance:   Bilateral Distance:    Right Eye Near:   Left Eye Near:    Bilateral Near:     Physical Exam Vitals and nursing note reviewed.  Constitutional:      General: She is not in acute distress.    Appearance: Normal appearance. She is not ill-appearing.  HENT:     Head: Normocephalic and atraumatic.     Right Ear: Ear canal and external ear normal. There is no impacted cerumen.     Left Ear: Ear canal and external ear normal. There is no impacted cerumen.     Nose: Congestion and rhinorrhea present.     Mouth/Throat:     Mouth: Mucous  membranes are moist.     Pharynx: Oropharynx is clear. Posterior oropharyngeal erythema present.  Cardiovascular:     Rate and Rhythm:  Normal rate and regular rhythm.     Pulses: Normal pulses.     Heart sounds: Normal heart sounds. No murmur heard.   No gallop.  Pulmonary:     Effort: Pulmonary effort is normal.     Breath sounds: Normal breath sounds. No wheezing, rhonchi or rales.  Abdominal:     Palpations: Abdomen is soft.     Tenderness: There is abdominal tenderness. There is no right CVA tenderness, left CVA tenderness, guarding or rebound.  Musculoskeletal:     Cervical back: Normal range of motion and neck supple.  Lymphadenopathy:     Cervical: No cervical adenopathy.  Skin:    General: Skin is warm and dry.     Capillary Refill: Capillary refill takes less than 2 seconds.     Findings: No erythema or rash.  Neurological:     General: No focal deficit present.     Mental Status: She is alert and oriented to person, place, and time.  Psychiatric:        Mood and Affect: Mood normal.        Behavior: Behavior normal.        Thought Content: Thought content normal.        Judgment: Judgment normal.     UC Treatments / Results  Labs (all labs ordered are listed, but only abnormal results are displayed) Labs Reviewed  URINALYSIS, COMPLETE (UACMP) WITH MICROSCOPIC - Abnormal; Notable for the following components:      Result Value   Specific Gravity, Urine >1.030 (*)    Bacteria, UA FEW (*)    All other components within normal limits  PREGNANCY, URINE    EKG   Radiology No results found.  Procedures Procedures (including critical care time)  Medications Ordered in UC Medications - No data to display  Initial Impression / Assessment and Plan / UC Course  I have reviewed the triage vital signs and the nursing notes.  Pertinent labs & imaging results that were available during my care of the patient were reviewed by me and considered in my medical  decision making (see chart for details).  Patient is a nontoxic-appearing 20 year old female here for evaluation of upper respiratory, lower [place], abdominal complaints that been going for last 3 days.  She also recently had an IUD removed and she is requesting a pregnancy test.  Patient reports that she has been having ear pain in both ears that will switch from side to side as far as intensity the pain goes.  Does also associate with ringing in both ears.  She has had a runny nose and nasal congestion, nonproductive cough, and intermittent shortness of breath.  She is able to speak in full sentences and is not exhibiting dyspnea during the health history.  She is complaining of abdominal pain as well but she indicates that it suprapubic in nature and is associated with urinary urgency and frequency.  She denies any pain with urination she also denies any vaginal itching, discharge, or burning.  Patient's physical exam reveals erythematous and injected tympanic membranes bilaterally.  Both external auditory canals are clear.  Nasal mucosa is erythematous and edematous with clear discharge in both nares.  Oropharyngeal exam reveals posterior oropharyngeal erythema with clear postnasal drip.  No cervical lymphadenopathy appreciated on exam.  Cardiopulmonary exam shows clear lung sounds in all fields.  No CVA tenderness on exam.  Abdomen is protuberant but soft.  She has tenderness over her suprapubic area but is not tender anywhere  else.  Patient exam is consistent with an upper respiratory infection and bilateral otitis media.  We will await the results of the pregnancy test to determine which antibiotic I can use to treat her ear infection.  We will also check a clean-catch UA for the presence of a urinary tract infection. Patient denies any concern for sexually transmitted infections and she again denies any vaginal discharge.  Urine pregnancy test indicated.  Urinalysis shows few bacteria and 6-10  squamous epithelials but is negative for nitrates, leukocyte esterase, or WBCs.  No evidence of UTI.  Will discharge patient home on doxycycline milligrams twice daily for 10 days for treatment of her otitis media.  We will also give Atrovent nasal spray to help with nasal congestion, Tessalon Perles, and Promethazine DM cough syrup.   Final Clinical Impressions(s) / UC Diagnoses   Final diagnoses:  Upper respiratory tract infection, unspecified type  Non-recurrent acute suppurative otitis media of both ears without spontaneous rupture of tympanic membranes     Discharge Instructions      Take the Doxycycline twice daily for 10 days with food for treatment of your ear infection.  Take an over-the-counter probiotic 1 hour after each dose of antibiotic to prevent diarrhea.  Use over-the-counter Tylenol and ibuprofen as needed for pain or fever.  Place a hot water bottle, or heating pad, underneath your pillowcase at night to help dilate up your ear and aid in pain relief as well as resolution of the infection.  Use the Atrovent nasal spray, 2 squirts in each nostril every 6 hours, as needed for runny nose and postnasal drip.  Use the Tessalon Perles every 8 hours during the day.  Take them with a small sip of water.  They may give you some numbness to the base of your tongue or a metallic taste in your mouth, this is normal.  Use the Promethazine DM cough syrup at bedtime for cough and congestion.  It will make you drowsy so do not take it during the day.  Return for reevaluation for any new or worsening symptoms.      ED Prescriptions     Medication Sig Dispense Auth. Provider   doxycycline (VIBRAMYCIN) 100 MG capsule Take 1 capsule (100 mg total) by mouth 2 (two) times daily. 20 capsule Becky Augusta, NP   benzonatate (TESSALON) 100 MG capsule Take 2 capsules (200 mg total) by mouth every 8 (eight) hours. 21 capsule Becky Augusta, NP   ipratropium (ATROVENT) 0.06 % nasal spray  Place 2 sprays into both nostrils 4 (four) times daily. 15 mL Becky Augusta, NP   promethazine-dextromethorphan (PROMETHAZINE-DM) 6.25-15 MG/5ML syrup Take 5 mLs by mouth 4 (four) times daily as needed. 118 mL Becky Augusta, NP      PDMP not reviewed this encounter.   Becky Augusta, NP 11/30/21 1732

## 2021-11-30 NOTE — Discharge Instructions (Addendum)
Take the Doxycycline twice daily for 10 days with food for treatment of your ear infection.  Take an over-the-counter probiotic 1 hour after each dose of antibiotic to prevent diarrhea.  Use over-the-counter Tylenol and ibuprofen as needed for pain or fever.  Place a hot water bottle, or heating pad, underneath your pillowcase at night to help dilate up your ear and aid in pain relief as well as resolution of the infection.  Use the Atrovent nasal spray, 2 squirts in each nostril every 6 hours, as needed for runny nose and postnasal drip.  Use the Tessalon Perles every 8 hours during the day.  Take them with a small sip of water.  They may give you some numbness to the base of your tongue or a metallic taste in your mouth, this is normal.  Use the Promethazine DM cough syrup at bedtime for cough and congestion.  It will make you drowsy so do not take it during the day.  Return for reevaluation for any new or worsening symptoms.

## 2021-12-13 ENCOUNTER — Other Ambulatory Visit: Payer: Self-pay

## 2021-12-13 ENCOUNTER — Ambulatory Visit
Admission: EM | Admit: 2021-12-13 | Discharge: 2021-12-13 | Disposition: A | Discharge status: Home / Self Care | Payer: Medicaid Other | Attending: Emergency Medicine | Admitting: Emergency Medicine

## 2021-12-13 DIAGNOSIS — R519 Headache, unspecified: Secondary | ICD-10-CM | POA: Diagnosis not present

## 2021-12-13 DIAGNOSIS — R42 Dizziness and giddiness: Secondary | ICD-10-CM

## 2021-12-13 DIAGNOSIS — H6506 Acute serous otitis media, recurrent, bilateral: Secondary | ICD-10-CM | POA: Diagnosis not present

## 2021-12-13 MED ORDER — KETOROLAC TROMETHAMINE 60 MG/2ML IM SOLN
30.0000 mg | Freq: Once | INTRAMUSCULAR | Status: AC
  Administered 2021-12-13: 30 mg via INTRAMUSCULAR

## 2021-12-13 MED ORDER — SUMATRIPTAN SUCCINATE 6 MG/0.5ML ~~LOC~~ SOLN
6.0000 mg | Freq: Once | SUBCUTANEOUS | Status: AC
  Administered 2021-12-13: 6 mg via SUBCUTANEOUS

## 2021-12-13 MED ORDER — SUMATRIPTAN SUCCINATE 50 MG PO TABS
50.0000 mg | ORAL_TABLET | ORAL | 0 refills | Status: DC | PRN
Start: 2021-12-13 — End: 2022-04-14

## 2021-12-13 MED ORDER — DEXAMETHASONE SODIUM PHOSPHATE 10 MG/ML IJ SOLN
10.0000 mg | Freq: Once | INTRAMUSCULAR | Status: AC
  Administered 2021-12-13: 10 mg via INTRAMUSCULAR

## 2021-12-13 MED ORDER — CEFDINIR 300 MG PO CAPS
300.0000 mg | ORAL_CAPSULE | Freq: Two times a day (BID) | ORAL | 0 refills | Status: AC
Start: 2021-12-13 — End: 2021-12-20

## 2021-12-13 MED ORDER — MECLIZINE HCL 12.5 MG PO TABS: 12.5000 mg | ORAL_TABLET | Freq: Three times a day (TID) | ORAL | 0 refills | Status: DC | PRN

## 2021-12-13 NOTE — Discharge Instructions (Addendum)
Your symptoms are most likely present due to your persistent ear infection  Your EKG was negative, it shows that your heart is beating at a regular pace and rhythm  Take cefdinir twice a day for the next 7 days, if you have reaction to this medication please discontinue use and notify urgent care  Attempted use of ibuprofen or Tylenol for management of headaches, if ineffective you may use Imitrex taking 1 tablet then if headache still present in 2 hours may take second dose, do not take more than 4 tablets within a 24-hour period  You may use meclizine for your dizziness, taking up to 3 tablets a day, your dose has been decreased from 25 mg to 1.25 mg  A primary care referral has been placed for you as well as information for a local neurologist

## 2021-12-13 NOTE — ED Triage Notes (Signed)
Patient presents to Urgent Care with complaints of headache, dizziness,SOB, and intermittent chest pain x 2 weeks ago. She states she noticed it after taking the medication prescribed. Not treating pain. Last dose of antivert 2 days ago. Needs a pcp.

## 2021-12-13 NOTE — ED Provider Notes (Signed)
[facility]    CSN: [id number] Arrival date & time: 12/13/21  1510      History   Chief Complaint Chief Complaint  Patient presents with   Headache    HPI Catherine [name] is a 21 y.o. female.   Patient presents with persistent right ear pain with associated itching for 2 weeks.  Endorses a generalized headache described as pressure worsened by lights and loud noises.  Endorses that headache triggered vertigo, and feels as if she is moving worsened but not dependent on movement.  Last night she began to have intermittent centralized chest pain radiating to the left side which cause much concern prompting her to come in to be evaluated.  Denies shortness of breath, lightheadedness, syncope.  Attempted use of meclizine, effective, last dose 2 days ago.  Worse of doxycycline 4 days ago.  Tree of allergy, asthma, migraines, vertigo.     Past Medical History:  Diagnosis Date   Allergy    Asthma    past hx   Migraine     Patient Active Problem List   Diagnosis Date Noted   Pelvic pain 04/03/2020   Right lower quadrant pain 04/03/2020   Vitamin D insufficiency 04/03/2020   Menorrhagia with regular cycle 04/03/2020   Body mass index (BMI) of 40.1-44.9 in adult [facility] 04/03/2020   Polyuria 04/03/2020   History of anemia 04/03/2020   Weight gain 04/03/2020   Annual physical exam 04/03/2020   Family history of brain aneurysm- father died around age 21  04/03/2020   High risk heterosexual behavior 04/03/2020   Migraine with aura and without status migrainosus, not intractable 04/03/2020   Acute non intractable tension-type headache 08/14/2018   OVERWEIGHT 10/22/2010   Allergic rhinitis 04/01/2010   ASTHMA, INTERMITTENT 02/01/2007    Past Surgical History:  Procedure Laterality Date   TONSILLECTOMY Bilateral 08/11/2015   Procedure: CONTROL POST TONSILLECTOMY BLEEDING ;  Surgeon: Vernie MurdersPaul Juengel, MD;  Location: [facility];  Service: ENT;  Laterality: Bilateral;   TONSILLECTOMY  AND ADENOIDECTOMY N/A 07/31/2015   Procedure: TONSILLECTOMY AND ADENOIDECTOMY;  Surgeon: Linus Salmonshapman McQueen, MD;  [facility];  Service: ENT;  Laterality: N/A;    OB History     Gravida  0   Para  0   Term  0   Preterm  0   AB  0   Living  0      SAB  0   IAB  0   Ectopic  0   Multiple  0   Live Births  0            Home Medications    Prior to Admission medications   Medication Sig Start Date End Date Taking? Authorizing Provider  benzonatate (TESSALON) 100 MG capsule Take 2 capsules (200 mg total) by mouth every 8 (eight) hours. 11/30/21   Becky Augustayan, Jeremy, NP  doxycycline (VIBRAMYCIN) 100 MG capsule Take 1 capsule (100 mg total) by mouth 2 (two) times daily. 11/30/21   Becky Augustayan, Jeremy, NP  ibuprofen (ADVIL) 200 MG tablet Take 200 mg by mouth every 6 (six) hours as needed.    [provider]  ipratropium (ATROVENT) 0.06 % nasal spray Place 2 sprays into both nostrils 4 (four) times daily. 11/30/21   Becky Augustayan, Jeremy, NP  meclizine (ANTIVERT) 25 MG tablet Take 25 mg by mouth 3 (three) times daily as needed for dizziness.    [provider]  Multiple Vitamins-Minerals (MULTIVITAMIN WITH MINERALS) tablet Take 1 tablet by mouth  daily. Patient not taking: Reported on 07/29/2021 06/05/20   Federico Flake, MD  promethazine-dextromethorphan (PROMETHAZINE-DM) 6.25-15 MG/5ML syrup Take 5 mLs by mouth 4 (four) times daily as needed. 11/30/21   Becky Augusta, NP    Family History Family History  Problem Relation Age of Onset   Hypertension Father    Aneurysm Father    Hypertension Brother    Diabetes Brother    Breast cancer Maternal Grandmother    Cancer Maternal Grandmother    Lung disease Maternal Grandmother    Bipolar disorder Mother    Depression Mother    Cancer Mother    Schizophrenia Mother    Aneurysm Paternal Grandfather    Heart attack Maternal Grandfather     Social History Social History   Tobacco Use   Smoking  status: Never   Smokeless tobacco: Never  Vaping Use   Vaping Use: Never used  Substance Use Topics   Alcohol use: Never   Drug use: Never     Allergies   Amoxicillin   Review of Systems Review of Systems  Constitutional: Negative.   HENT:  Positive for ear pain. Negative for congestion, dental problem, drooling, ear discharge, facial swelling, hearing loss, mouth sores, nosebleeds, postnasal drip, rhinorrhea, sinus pressure, sinus pain, sneezing, sore throat, tinnitus, trouble swallowing and voice change.   Respiratory: Negative.    Cardiovascular: Negative.   Skin: Negative.   Neurological:  Positive for dizziness and headaches. Negative for tremors, seizures, syncope, facial asymmetry, speech difficulty, weakness, light-headedness and numbness.    Physical Exam Triage Vital Signs ED Triage Vitals [12/13/21 1615]  Enc Vitals Group     BP 139/89     Pulse Rate 73     Resp 16     Temp 98.3 F (36.8 C)     Temp Source Oral     SpO2 100 %     Weight      Height      Head Circumference      Peak Flow      Pain Score      Pain Loc      Pain Edu?      Excl. in GC?    No data found.  Updated Vital Signs BP 139/89 (BP Location: Left Arm)    Pulse 73    Temp 98.3 F (36.8 C) (Oral)    Resp 16    LMP 12/06/2021    SpO2 100%   Visual Acuity Right Eye Distance:   Left Eye Distance:   Bilateral Distance:    Right Eye Near:   Left Eye Near:    Bilateral Near:     Physical Exam Constitutional:      Appearance: Normal appearance. She is normal weight.  HENT:     Right Ear: Hearing, ear canal and external ear normal. Tympanic membrane is erythematous.     Left Ear: Hearing, ear canal and external ear normal. Tympanic membrane is erythematous.  Eyes:     Extraocular Movements: Extraocular movements intact.  Cardiovascular:     Rate and Rhythm: Normal rate and regular rhythm.     Pulses: Normal pulses.     Heart sounds: Normal heart sounds.  Pulmonary:      Effort: Pulmonary effort is normal.     Breath sounds: Normal breath sounds.  Skin:    General: Skin is warm and dry.  Neurological:     Mental Status: She is alert and oriented to person, place, and time. Mental status  is at baseline.  Psychiatric:        Mood and Affect: Mood normal.        Behavior: Behavior normal.     UC Treatments / Results  Labs (all labs ordered are listed, but only abnormal results are displayed) Labs Reviewed - No data to display  EKG   Radiology No results found.  Procedures Procedures (including critical care time)  Medications Ordered in UC Medications - No data to display  Initial Impression / Assessment and Plan / UC Course  I have reviewed the triage vital signs and the nursing notes.  Pertinent labs & imaging results that were available during my care of the patient were reviewed by me and considered in my medical decision making (see chart for details).  Recurrent acute serous otitis media of both ears Bad headache Dizziness  Vital signs are stable, patient is in no signs of distress, EKG showing normal sinus rhythm, symptoms are most likely being exacerbated by persistent ear infection, discussed with patient, allergy to amoxicillin, recent use of doxycycline, will treat with cefdinir for 7-day course, given precautions for adverse reaction to discontinue medication and notify urgent care soon as possible, patient endorses that meclizine is effective for treatment of dizziness however medication makes her feel very fatigued and drowsy, will decrease dose from 25 mg to 12.5 mg, patient has used Fioricet and Compazine in the past for treatment of migraines which were ineffective, recommended continue use of over-the-counter Tylenol and ibuprofen, prescribed Imitrex, given in office Decadron, Toradol and Imitrex to help reduce current headache, PCP referral placed and given walking referral to neurology for persistent migraines and dizziness for  further evaluation Final Clinical Impressions(s) / UC Diagnoses   Final diagnoses:  None   Discharge Instructions   None    ED Prescriptions   None    PDMP not reviewed this encounter.   Valinda Hoar, NP 12/13/21 1728

## 2021-12-29 ENCOUNTER — Ambulatory Visit (INDEPENDENT_AMBULATORY_CARE_PROVIDER_SITE_OTHER): Payer: Medicaid Other

## 2021-12-29 ENCOUNTER — Other Ambulatory Visit: Payer: Self-pay

## 2021-12-29 ENCOUNTER — Ambulatory Visit
Admission: EM | Admit: 2021-12-29 | Discharge: 2021-12-29 | Payer: Medicaid Other | Attending: Medical Oncology | Admitting: Medical Oncology

## 2021-12-29 DIAGNOSIS — R0602 Shortness of breath: Secondary | ICD-10-CM

## 2021-12-29 DIAGNOSIS — R0789 Other chest pain: Secondary | ICD-10-CM | POA: Diagnosis not present

## 2021-12-29 DIAGNOSIS — R9431 Abnormal electrocardiogram [ECG] [EKG]: Secondary | ICD-10-CM

## 2021-12-29 DIAGNOSIS — R079 Chest pain, unspecified: Secondary | ICD-10-CM | POA: Diagnosis not present

## 2021-12-29 NOTE — ED Provider Notes (Signed)
[facility]    CSN: [id number] Arrival date & time: 12/29/21  1449      History   Chief Complaint Chief Complaint  Patient presents with   Chest Pain   Shortness of Breath    HPI Catherine [name] is a 21 y.o. female.   HPI  SOB: Patient reports that starting last night she has had shortness of breath and chest pain.  Symptoms have worsened over the past few hours in terms of her shortness of breath.  She describes the chest pain as being located mainly around her sternum area but does travel slightly to the left and right side of her chest as well as towards her back.  Taking a deep breath worsens her chest discomfort.  No cough, fevers, hemoptysis.  She has not tried anything for symptoms.  She has a history of asthma and anemia.  No known history of sickle cell disease of the family but she is unsure.  She does report that her brother has similar symptoms from time to time and has been to the hospital multiple times for this.  No clotting disease history.  She is not on hormonal birth control.  No history of blood clots or strokes.  She denies any calf pain or leg swelling.  Past Medical History:  Diagnosis Date   Allergy    Asthma    past hx   Migraine     Patient Active Problem List   Diagnosis Date Noted   Pelvic pain 04/04/20   Right lower quadrant pain 2020/04/04   Vitamin D insufficiency 2020-04-04   Menorrhagia with regular cycle 04-Apr-2020   Body mass index (BMI) of 40.1-44.9 in adult [facility]) 04-04-2020   Polyuria 04/04/2020   History of anemia 2020/04/04   Weight gain 04-04-2020   Annual physical exam 2020/04/04   Family history of brain aneurysm- father died around age 44  04/04/20   High risk heterosexual behavior 04-04-2020   Migraine with aura and without status migrainosus, not intractable 04-04-2020   Acute non intractable tension-type headache 08/14/2018   OVERWEIGHT 10/22/2010   Allergic rhinitis 04/01/2010   ASTHMA, INTERMITTENT 02/01/2007     Past Surgical History:  Procedure Laterality Date   TONSILLECTOMY Bilateral 08/11/2015   Procedure: CONTROL POST TONSILLECTOMY BLEEDING ;  Surgeon: Vernie Murders, MD;  Location: [facility];  Service: ENT;  Laterality: Bilateral;   TONSILLECTOMY AND ADENOIDECTOMY N/A 07/31/2015   Procedure: TONSILLECTOMY AND ADENOIDECTOMY;  Surgeon: Linus Salmons, MD;  Location: [facility];  Service: ENT;  Laterality: N/A;    OB History     Gravida  0   Para  0   Term  0   Preterm  0   AB  0   Living  0      SAB  0   IAB  0   Ectopic  0   Multiple  0   Live Births  0            Home Medications    Prior to Admission medications   Medication Sig Start Date End Date Taking? Authorizing Provider  benzonatate (TESSALON) 100 MG capsule Take 2 capsules (200 mg total) by mouth every 8 (eight) hours. 11/30/21   Becky Augusta, NP  doxycycline (VIBRAMYCIN) 100 MG capsule Take 1 capsule (100 mg total) by mouth 2 (two) times daily. 11/30/21   Becky Augusta, NP  ibuprofen (ADVIL) 200 MG tablet Take 200 mg by mouth every 6 (six) hours as needed.  [provider]  ipratropium (ATROVENT) 0.06 % nasal spray Place 2 sprays into both nostrils 4 (four) times daily. 11/30/21   Margarette Canada, NP  meclizine (ANTIVERT) 12.5 MG tablet Take 1 tablet (12.5 mg total) by mouth 3 (three) times daily as needed for dizziness. 12/13/21   Hans Eden, NP  Multiple Vitamins-Minerals (MULTIVITAMIN WITH MINERALS) tablet Take 1 tablet by mouth daily. Patient not taking: Reported on 07/29/2021 06/05/20   Caren Macadam, MD  promethazine-dextromethorphan (PROMETHAZINE-DM) 6.25-15 MG/5ML syrup Take 5 mLs by mouth 4 (four) times daily as needed. 11/30/21   Margarette Canada, NP  SUMAtriptan (IMITREX) 50 MG tablet Take 1 tablet (50 mg total) by mouth every 2 (two) hours as needed for migraine. May repeat in 2 hours if headache persists or recurs. 12/13/21   Hans Eden, NP    Family  History Family History  Problem Relation Age of Onset   Hypertension Father    Aneurysm Father    Hypertension Brother    Diabetes Brother    Breast cancer Maternal Grandmother    Cancer Maternal Grandmother    Lung disease Maternal Grandmother    Bipolar disorder Mother    Depression Mother    Cancer Mother    Schizophrenia Mother    Aneurysm Paternal Grandfather    Heart attack Maternal Grandfather     Social History Social History   Tobacco Use   Smoking status: Never   Smokeless tobacco: Never  Vaping Use   Vaping Use: Never used  Substance Use Topics   Alcohol use: Never   Drug use: Never     Allergies   Amoxicillin   Review of Systems Review of Systems  As stated above in HPI Physical Exam Triage Vital Signs ED Triage Vitals  Enc Vitals Group     BP 12/29/21 1509 (!) 156/97     Pulse Rate 12/29/21 1509 76     Resp 12/29/21 1509 16     Temp 12/29/21 1509 98.5 F (36.9 C)     Temp Source 12/29/21 1509 Oral     SpO2 12/29/21 1509 100 %     Weight --      Height --      Head Circumference --      Peak Flow --      Pain Score 12/29/21 1508 9     Pain Loc --      Pain Edu? --      Excl. in [place]? --    No data found.  Updated Vital Signs BP (!) 156/97 (BP Location: Left Arm)    Pulse 76    Temp 98.5 F (36.9 C) (Oral)    Resp 16    LMP 12/06/2021    SpO2 100%   Physical Exam Vitals and nursing note reviewed.  Constitutional:      General: She is not in acute distress.    Appearance: She is well-developed. She is not ill-appearing, toxic-appearing or diaphoretic.  HENT:     Head: Normocephalic and atraumatic.  Eyes:     Extraocular Movements: Extraocular movements intact.     Pupils: Pupils are equal, round, and reactive to light.  Neck:     Thyroid: No thyromegaly.     Vascular: No JVD.     Trachea: No tracheal deviation.  Cardiovascular:     Rate and Rhythm: Normal rate and regular rhythm.     Heart sounds: Normal heart sounds.   Pulmonary:     Effort: Pulmonary  effort is normal.     Breath sounds: Normal breath sounds.  Chest:     Chest wall: No mass, tenderness, crepitus or edema.  Abdominal:     Palpations: Abdomen is soft.  Musculoskeletal:     Cervical back: Normal range of motion and neck supple.     Right lower leg: No edema.     Left lower leg: No edema.  Lymphadenopathy:     Cervical: No cervical adenopathy.  Skin:    General: Skin is warm.     Capillary Refill: Capillary refill takes less than 2 seconds.     Coloration: Skin is not cyanotic or pale.  Neurological:     General: No focal deficit present.     Mental Status: She is alert and oriented to person, place, and time.     UC Treatments / Results  Labs (all labs ordered are listed, but only abnormal results are displayed) Labs Reviewed - No data to display  EKG   Radiology No results found.  Procedures Procedures (including critical care time)  Medications Ordered in UC Medications - No data to display  Initial Impression / Assessment and Plan / UC Course  I have reviewed the triage vital signs and the nursing notes.  Pertinent labs & imaging results that were available during my care of the patient were reviewed by me and considered in my medical decision making (see chart for details).     New.  Her EKG shows a new arrhythmia.  Chest x-ray pending.  Given that she is symptomatic and with this new arrhythmia she will need further evaluation and labs.  I will be referring her to the emergency room.  She elects private vehicle transport but this will depend on chest x-ray findings. Final Clinical Impressions(s) / UC Diagnoses   Final diagnoses:  None   Discharge Instructions   None    ED Prescriptions   None    PDMP not reviewed this encounter.   Hughie Closs, [place] 12/29/21 1617

## 2021-12-29 NOTE — ED Notes (Signed)
Patient is being discharged from the Urgent Care and sent to the Emergency Department via POV . Per [place], [place], patient is in need of higher level of care due to chest pain. Patient is aware and verbalizes understanding of plan of care.  Vitals:   12/29/21 1509  BP: (!) 156/97  Pulse: 76  Resp: 16  Temp: 98.5 F (36.9 C)  SpO2: 100%

## 2021-12-29 NOTE — ED Triage Notes (Signed)
Patient presents to Urgent Care with complaints of intermittent chest pain located at center of chest, radiates to right chest and center of back. She states pain started this morning at work and SOB since last night. She states when she sat to rest pain decreased, she did note pain worse when using her upper body. Treating pain with aleve. Has a hx of asthma, no hx of anxiety.

## 2022-04-08 ENCOUNTER — Ambulatory Visit
Admission: EM | Admit: 2022-04-08 | Discharge: 2022-04-08 | Disposition: A | Discharge status: Home / Self Care | Payer: Medicaid Other

## 2022-04-08 DIAGNOSIS — H6123 Impacted cerumen, bilateral: Secondary | ICD-10-CM

## 2022-04-08 DIAGNOSIS — S00412A Abrasion of left ear, initial encounter: Secondary | ICD-10-CM | POA: Diagnosis not present

## 2022-04-08 NOTE — ED Provider Notes (Signed)
?[place] ? ? ? ?CSN: [id number] ?Arrival date & time: 04/08/22  1339 ? ? ?  ? ?History   ?Chief Complaint ?Chief Complaint  ?Patient presents with  ? Otalgia  ? ? ?HPI ?Catherine [name] is a 21 y.o. female who presents because she was using a tissue to clean her ears since both have been draining clear matter, and noticed blood on it from the L today. Denies pain. Has intermittent hearing loss from the L. Denies any other symptoms.  ? ? ? ?Past Medical History:  ?Diagnosis Date  ? Allergy   ? Asthma   ? past hx  ? Migraine   ? ? ?Patient Active Problem List  ? Diagnosis Date Noted  ? Pelvic pain Apr 29, 2020  ? Right lower quadrant pain 04/29/2020  ? Vitamin D insufficiency 29-Apr-2020  ? Menorrhagia with regular cycle Apr 29, 2020  ? Body mass index (BMI) of 40.1-44.9 in adult [facility]) 2020-04-29  ? Polyuria Apr 29, 2020  ? History of anemia 2020-04-29  ? Weight gain 04-29-2020  ? Annual physical exam 29-Apr-2020  ? Family history of brain aneurysm- father died around age 43  04-29-20  ? High risk heterosexual behavior 2020/04/29  ? Migraine with aura and without status migrainosus, not intractable 04/29/2020  ? Acute non intractable tension-type headache 08/14/2018  ? OVERWEIGHT 10/22/2010  ? Allergic rhinitis 04/01/2010  ? ASTHMA, INTERMITTENT 02/01/2007  ? ? ?Past Surgical History:  ?Procedure Laterality Date  ? TONSILLECTOMY Bilateral 08/11/2015  ? Procedure: CONTROL POST TONSILLECTOMY BLEEDING ;  Surgeon: Margaretha Sheffield, MD;  Location: [facility];  Service: ENT;  Laterality: Bilateral;  ? TONSILLECTOMY AND ADENOIDECTOMY N/A 07/31/2015  ? Procedure: TONSILLECTOMY AND ADENOIDECTOMY;  Surgeon: Beverly Gust, MD;  Location: [place];  Service: ENT;  Laterality: N/A;  ? ? ?OB History   ? ? Gravida  ?0  ? Para  ?0  ? Term  ?0  ? Preterm  ?0  ? AB  ?0  ? Living  ?0  ?  ? ? SAB  ?0  ? IAB  ?0  ? Ectopic  ?0  ? Multiple  ?0  ? Live Births  ?0  ?   ?  ?  ? ? ? ?Home Medications   ? ?Prior to Admission medications    ?Medication Sig Start Date End Date Taking? Authorizing Provider  ?meclizine (ANTIVERT) 12.5 MG tablet Take 1 tablet (12.5 mg total) by mouth 3 (three) times daily as needed for dizziness. 12/13/21  Yes White, Adrienne R, NP  ?naproxen sodium (ALEVE) 220 MG tablet Take 220 mg by mouth.   Yes [provider]  ?benzonatate (TESSALON) 100 MG capsule Take 2 capsules (200 mg total) by mouth every 8 (eight) hours. 11/30/21   Margarette Canada, NP  ?doxycycline (VIBRAMYCIN) 100 MG capsule Take 1 capsule (100 mg total) by mouth 2 (two) times daily. 11/30/21   Margarette Canada, NP  ?ibuprofen (ADVIL) 200 MG tablet Take 200 mg by mouth every 6 (six) hours as needed.    [provider]  ?ipratropium (ATROVENT) 0.06 % nasal spray Place 2 sprays into both nostrils 4 (four) times daily. 11/30/21   Margarette Canada, NP  ?Multiple Vitamins-Minerals (MULTIVITAMIN WITH MINERALS) tablet Take 1 tablet by mouth daily. ?Patient not taking: Reported on 07/29/2021 06/05/20   Caren Macadam, MD  ?promethazine-dextromethorphan (PROMETHAZINE-DM) 6.25-15 MG/5ML syrup Take 5 mLs by mouth 4 (four) times daily as needed. 11/30/21   Margarette Canada, NP  ?SUMAtriptan (IMITREX) 50 MG tablet Take 1  tablet (50 mg total) by mouth every 2 (two) hours as needed for migraine. May repeat in 2 hours if headache persists or recurs. 12/13/21   Hans Eden, NP  ? ? ?Family History ?Family History  ?Problem Relation Age of Onset  ? Hypertension Father   ? Aneurysm Father   ? Hypertension Brother   ? Diabetes Brother   ? Breast cancer Maternal Grandmother   ? Cancer Maternal Grandmother   ? Lung disease Maternal Grandmother   ? Bipolar disorder Mother   ? Depression Mother   ? Cancer Mother   ? Schizophrenia Mother   ? Aneurysm Paternal Grandfather   ? Heart attack Maternal Grandfather   ? ? ?Social History ?Social History  ? ?Tobacco Use  ? Smoking status: Never  ?  Passive exposure: Never  ? Smokeless tobacco: Never  ?Vaping Use  ? Vaping Use: Never  used  ?Substance Use Topics  ? Alcohol use: Yes  ?  Comment: Sometimes.  ? Drug use: Never  ? ? ? ?Allergies   ?Amoxicillin ? ? ?Review of Systems ?Review of Systems ?L ear feels clogged off and on, saw blood from L. The rest is neg ? ?Physical Exam ?Triage Vital Signs ?ED Triage Vitals  ?Enc Vitals Group  ?   BP 04/08/22 1410 (!) 143/75  ?   Pulse Rate 04/08/22 1410 68  ?   Resp 04/08/22 1410 18  ?   Temp 04/08/22 1410 98.4 ?F (36.9 ?C)  ?   Temp Source 04/08/22 1410 Oral  ?   SpO2 04/08/22 1410 100 %  ?   Weight 04/08/22 1407 260 lb (117.9 kg)  ?   Height 04/08/22 1407 5\' 3"  (1.6 m)  ?   Head Circumference --   ?   Peak Flow --   ?   Pain Score 04/08/22 1405 0  ?   Pain Loc --   ?   Pain Edu? --   ?   Excl. in [place]? --   ? ?No data found. ? ?Updated Vital Signs ?BP (!) 143/75 (BP Location: Left Arm)   Pulse 68   Temp 98.4 ?F (36.9 ?C) (Oral)   Resp 18   Ht 5\' 3"  (1.6 m)   Wt 260 lb (117.9 kg)   LMP  (LMP Unknown)   SpO2 100%   BMI 46.06 kg/m?  ? ?Visual Acuity ?Right Eye Distance:   ?Left Eye Distance:   ?Bilateral Distance:   ? ?Right Eye Near:   ?Left Eye Near:    ?Bilateral Near:    ? ?Physical Exam ?Vitals reviewed.  ?Constitutional:   ?   General: She is not in acute distress. ?   Appearance: She is obese.  ?HENT:  ?   Right Ear: There is impacted cerumen.  ?   Left Ear: There is impacted cerumen.  ?   Ears:  ?   Comments: After lavage both TM's are intact and normal. She does have like an abrasion on the L upper canal and could be where she had the bleeding.  ?Eyes:  ?   General: No scleral icterus. ?   Conjunctiva/sclera: Conjunctivae normal.  ?Pulmonary:  ?   Effort: Pulmonary effort is normal.  ?Musculoskeletal:  ?   Cervical back: Neck supple.  ?Skin: ?   General: Skin is warm and dry.  ?   Findings: No rash.  ?Neurological:  ?   Mental Status: She is alert and oriented to person, place, and time.  ?  Gait: Gait normal.  ?Psychiatric:     ?   Mood and Affect: Mood normal.     ?   Behavior:  Behavior normal.     ?   Thought Content: Thought content normal.     ?   Judgment: Judgment normal.  ? ? ? ?UC Treatments / Results  ?Labs ?(all labs ordered are listed, but only abnormal results are displayed) ?Labs Reviewed - No data to display ? ?EKG ? ? ?Radiology ?No results found. ? ?Procedures ?Procedures (including critical care time) ? ?Medications Ordered in UC ?Medications - No data to display ? ?Initial Impression / Assessment and Plan / UC Course  ?I have reviewed the triage vital signs and the nursing notes. ? ?Cerumen impaction both ears ?L ear canal abrasion ? ?There is no active bleeding present on L ear canal, and no treatment advised right now.  ? ? ?Final Clinical Impressions(s) / UC Diagnoses  ? ?Final diagnoses:  ?None  ? ?Discharge Instructions   ?None ?  ? ?ED Prescriptions   ?None ?  ? ?PDMP not reviewed this encounter. ?  ?Shelby Mattocks, PA-C ?04/08/22 1636 ? ?

## 2022-04-08 NOTE — ED Triage Notes (Signed)
Patient is here for "Ear problem, left, Used tissue to clean out prior to pain, then saw some blood on tissue from ear". Note: History of Vertigo.  ?

## 2022-04-14 ENCOUNTER — Ambulatory Visit (INDEPENDENT_AMBULATORY_CARE_PROVIDER_SITE_OTHER): Payer: Medicaid Other | Admitting: Internal Medicine

## 2022-04-14 ENCOUNTER — Encounter: Payer: Self-pay | Admitting: Internal Medicine

## 2022-04-14 ENCOUNTER — Other Ambulatory Visit: Payer: Self-pay

## 2022-04-14 VITALS — BP 139/85 | HR 71 | Temp 98.2°F | Ht 63.0 in | Wt 251.7 lb

## 2022-04-14 DIAGNOSIS — R5383 Other fatigue: Secondary | ICD-10-CM | POA: Diagnosis not present

## 2022-04-14 DIAGNOSIS — G43109 Migraine with aura, not intractable, without status migrainosus: Secondary | ICD-10-CM

## 2022-04-14 DIAGNOSIS — Z3202 Encounter for pregnancy test, result negative: Secondary | ICD-10-CM | POA: Diagnosis not present

## 2022-04-14 DIAGNOSIS — R3589 Other polyuria: Secondary | ICD-10-CM | POA: Diagnosis not present

## 2022-04-14 DIAGNOSIS — Z6841 Body Mass Index (BMI) 40.0 and over, adult: Secondary | ICD-10-CM | POA: Diagnosis not present

## 2022-04-14 DIAGNOSIS — N921 Excessive and frequent menstruation with irregular cycle: Secondary | ICD-10-CM

## 2022-04-14 DIAGNOSIS — N926 Irregular menstruation, unspecified: Secondary | ICD-10-CM

## 2022-04-14 DIAGNOSIS — J4520 Mild intermittent asthma, uncomplicated: Secondary | ICD-10-CM

## 2022-04-14 LAB — POCT GLYCOSYLATED HEMOGLOBIN (HGB A1C): Hemoglobin A1C: 5.3 % (ref 4.0–5.6)

## 2022-04-14 LAB — GLUCOSE, CAPILLARY: Glucose-Capillary: 91 mg/dL (ref 70–99)

## 2022-04-14 LAB — POCT URINE PREGNANCY: Preg Test, Ur: NEGATIVE

## 2022-04-14 MED ORDER — NURTEC 75 MG PO TBDP: 75.0000 mg | ORAL_TABLET | Freq: Every day | ORAL | 3 refills | Status: DC | PRN

## 2022-04-14 NOTE — Assessment & Plan Note (Signed)
See A&P for BMI 40-45 ?

## 2022-04-14 NOTE — Assessment & Plan Note (Signed)
Patient reports history of asthma in childhood though has not required an inhaler for symptoms of shortness of breath in several years.  ?

## 2022-04-14 NOTE — Patient Instructions (Signed)
Thank you, Ms.Maitland for allowing [place] to provide your care today. Today we discussed: ? ?Irregular periods: We will order lab work for you to see if there are hormonal causes. We will do a urine pregnancy test as well. I will order some lab work that needs to be completed in the future. Please come back to clinic two weeks after your next period starts for a lab visit.  ? ?Weight loss: Your BMI is elevated, we talked about making dietary changes for weight loss today. I will refer you to a nutritionist to help with this. ? ?Migraines: I worry that your vertigo before a headache is an aura since you have migraines after this, in that case I would want you to stop taking the Sumatriptan and start Nurtec as needed for migraines at migraine onset, do not take more than one pill per day, most people tolerate this medication well. You may have some upset stomach.  ? ?I have ordered the following labs for you: ? ? ?Lab Orders    ?     Iron and IBC [id number])    ?     CBC no Diff    ?     TSH    ?     Prolactin    ?     POC Hbg A1C    ?     POCT Urine Pregnancy     ? ?My Chart Access: ?[email or web address] ? ?Please follow-up in 3 months. ? ?Please make sure to arrive 15 minutes prior to your next appointment. If you arrive late, you may be asked to reschedule.  ?  ?We look forward to seeing you next time. Please call our clinic at [phone] if you have any questions or concerns. The best time to call is Monday-Friday from 9am-4pm, but there is someone available 24/7. If after hours or the weekend, call the main hospital number and ask for the Internal Medicine Resident On-Call. If you need medication refills, please notify your pharmacy one week in advance and they will send [place] a request. ?  ?Thank you for letting us take part in your care. Wishing you the best! ? ?Wayland Denis, MD ?04/14/2022, 10:23 AM ?IM Resident, PGY-1 ? ?

## 2022-04-14 NOTE — Progress Notes (Signed)
?  CC: establish care, irregular periods ? ?HPI: ? ?[name] is a 21 y.o. female with a past medical history stated below and presents today to establish care, discuss irregular periods. Please see problem based assessment and plan for additional details. ? ?Past Medical History:  ?Diagnosis Date  ? Allergy   ? Asthma   ? past hx  ? Family history of brain aneurysm- father died around age 58  04/11/20  ? History of anemia 04/11/2020  ? Migraine   ? Pelvic pain Apr 11, 2020  ? ? ?Current Outpatient Medications on File Prior to Visit  ?Medication Sig Dispense Refill  ? ipratropium (ATROVENT) 0.06 % nasal spray Place 2 sprays into both nostrils 4 (four) times daily. 15 mL 12  ? meclizine (ANTIVERT) 12.5 MG tablet Take 1 tablet (12.5 mg total) by mouth 3 (three) times daily as needed for dizziness. 30 tablet 0  ? Multiple Vitamins-Minerals (MULTIVITAMIN WITH MINERALS) tablet Take 1 tablet by mouth daily. (Patient not taking: Reported on 07/29/2021) 100 tablet 0  ? naproxen sodium (ALEVE) 220 MG tablet Take 220 mg by mouth.    ? ?No current facility-administered medications on file prior to visit.  ? ? ?Family History  ?Problem Relation Age of Onset  ? Hypertension Father   ? Aneurysm Father   ? Hypertension Brother   ? Diabetes Brother   ? Breast cancer Maternal Grandmother   ? Cancer Maternal Grandmother   ? Lung disease Maternal Grandmother   ? Bipolar disorder Mother   ? Depression Mother   ? Cancer Mother   ? Schizophrenia Mother   ? Aneurysm Paternal Grandfather   ? Heart attack Maternal Grandfather   ? ? ?Social History: Patient lives in [place] with her boyfriend and dog, they recently moved into their own place. She endorses feeling safe at home with her partner. Previously worked for [employer], now at [employer] as a Public affairs consultant. ? ?Review of Systems: ?ROS negative except for what is noted on the assessment and plan. ? ?Vitals:  ? 04/14/22 0935  ?BP: 139/85  ?Pulse: 71  ?Temp: 98.2 ?F (36.8 ?C)   ?SpO2: 100%  ?Weight: 251 lb 11.2 oz (114.2 kg)  ?Height: 5\' 3"  (1.6 m)  ? ? ? ?Physical Exam: ?General: Well appearing african female, obese, NAD ?HENT: normocephalic, atraumatic, external ears and nares appear normal, nose ring ?EYES: conjunctiva non-erythematous, no scleral icterus ?CV: regular rate, normal rhythm, no murmurs, rubs, gallops. ?Pulmonary: normal work of breathing on RA, lungs clear to auscultation, no rales, wheezes, rhonchi ?Abdominal: non-distended, soft, non-tender to palpation, normal BS ?Skin: Warm and dry, no rashes or lesions ?Neurological: ?MS: awake, alert and oriented x3, normal speech and fund of knowledge ?Motor: moves all extremities antigravity ?Psych: normal affect ? ? ? ?Assessment & Plan:  ? ?See Encounters Tab for problem based charting. ? ?Patient discussed with Dr.  [place] ? ?Sol Blazing, M.D. ?[facility] Internal Medicine, PGY-1 ?Pager: [phone] ?Date 04/14/2022 Time 1:34 PM ? ?

## 2022-04-14 NOTE — Assessment & Plan Note (Addendum)
Patient reports significant weight gain following the started Depo-Provera shot.  This is stabilized since discontinuing, patient has not been able to lose weight since.  Per chart review patient has gained 10 pounds in last 1 year.  Patient has not made an active attempt to lose weight.  She is interested in losing weight and we discussed dietary changes and exercise.  Patient is interested in learning more about healthy eating and would like to learn to cook.  She is interested with speaking with the nutritionist. ? ?Patient does endorse polydipsia and polyuria. No hemoglobin A1c for review in our system.  Last lipid panel 2021 with total cholesterol 166 and LDL 119. ? ?On assessment, patient is obese with BMI 44.6.  We will continue with lifestyle modifications for now to see if patient is able to intentionally lose weight after starting active attempt to lose weight.  ?Plan: ?-Obtain hemoglobin A1c ?-Lifestyle modifications ?-Medical nutrition therapy consult ?

## 2022-04-14 NOTE — Assessment & Plan Note (Addendum)
Patient reports history of migraines, with unilateral headache, with, nasuea, phono and photophobia for which she has been prescribed sumatriptan in the past.  Prior to migraines she has a visual aura where she sees the color red in her field of vision.  Today she also endorses intermittent vertigo for which she has been prescribed meclizine.  Upon further questioning, patient reports that her episodes of vertigo are almost always followed by a migraine.  Patient also endorses tinnitus, poor coordination during her vertigo.  Patient has severe migraines once or twice a month, several times a year.  Only when she has a severe migraine that she take the sumatriptan.  Usually her migraines improved after taking over-the-counter anti-inflammatories for migraine.  She does occasionally have to call out of work secondary to migraine symptoms. ? ?On assessment, patient describes migraines with both visual and vertigo as auras.  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  Sumatriptan needs to be discontinued for this patient.  Triptans and ergotamines are contraindicated for patients with brainstem auras.  This patient will require alternative abortive medication for migraines, Nurtec.  Discussed indications, side effects, how to take this medication. ?Plan: ?-Discontinue sumatriptan ?-Start Nurtec as needed for severe migraines ?-Continue over-the-counter anti-inflammatory migraine medication for less severe headaches ? ? ?

## 2022-04-14 NOTE — Assessment & Plan Note (Addendum)
Patient presents to establish care with [facility] as PCP. She endorses hx of irregular periods. She had regular periods growing up until she started depot provera shot. She then became irregular and had substantial weight gain on this. Patient discontinued the depot shot and started on copper IUD, had heavy menstrual bleeding on this. The IUD was removed sept 2022 and since she has had irregular periods with periods that occur at variable intervals with occasional heavy periods with the heaviest day requiring 12 tampons per day. LMP April 17th. ? ?Patient currently not on birth control, was previously started on birth control initially for prevention of pregnancy not othe medical indication. Patient not using protection including condoms. She understands she could become pregnant since she is currently sexually active with her boyfriend, she is ok with pregnancy if this happens. If patient were to need to go back on birth control for medical reasons she would be ok with this.  ? ?Patient denies hirsutism, has a few coarse hairs on her chin that she plucks rarely, no substantial hair loss or hair thinning or change in hairline. No acne. Patient endorses feeling cold more often than previously, no constiptaion, endorses fatigue, sleeping more, weight gain though recently more stable weights. TSH 0.834 in 2021. CBC with Hgb normal with microcytosis in 2021. ? ?On assessment, patient has menorrhagia with irregular cycles, having very heavy periods. Concern for iron deficiency anemia due to heavy periods, will check CBC. Concern for hormonal causes of irregular menses including thyroid disease and PCOS given obesity though reassuring that patient has no signs of hyperandrogenism on exam.Will check for lab evidence of insulin resistance. 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  ?Plan: ?-Urine pregnancy test ?-Hgb A1c ?-CBC ?-Prolactin ?-Iron Panel ?-Future labs:  total testosterone, serum 17 hydroxyprogesterone (late onset CAH). Patient to come back two weeks after her period starts for lab appointment. ?-Consider for further hormonal workup if needed and indicated: DHEA (adrenal tumor), 24hr urine free cortisol. ([place]).  ? ?ADDENDUM: Iron 46, Iron % saturation, Hgb 12.2 with microcytosis, thrombocytosis 494. Normal TSH 0.633. Prolactin 11.4 normal. Pregnancy test negative.  ?

## 2022-04-15 LAB — CBC
Hematocrit: 38.4 % (ref 34.0–46.6)
Hemoglobin: 12.2 g/dL (ref 11.1–15.9)
MCH: 23.1 pg — ABNORMAL LOW (ref 26.6–33.0)
MCHC: 31.8 g/dL (ref 31.5–35.7)
MCV: 73 fL — ABNORMAL LOW (ref 79–97)
Platelets: 494 10*3/uL — ABNORMAL HIGH (ref 150–450)
RBC: 5.27 x10E6/uL (ref 3.77–5.28)
RDW: 16.6 % — ABNORMAL HIGH (ref 11.7–15.4)
WBC: 8.2 10*3/uL (ref 3.4–10.8)

## 2022-04-15 LAB — IRON AND TIBC
Iron Saturation: 11 % — ABNORMAL LOW (ref 15–55)
Iron: 46 ug/dL (ref 27–159)
Total Iron Binding Capacity: 409 ug/dL (ref 250–450)
UIBC: 363 ug/dL (ref 131–425)

## 2022-04-15 LAB — TSH: TSH: 0.633 u[IU]/mL (ref 0.450–4.500)

## 2022-04-15 LAB — PROLACTIN: Prolactin: 11.4 ng/mL (ref 4.8–23.3)

## 2022-04-18 NOTE — Progress Notes (Signed)
Internal Medicine Clinic Attending ? ?Case discussed with Dr. Zinoviev  At the time of the visit.  We reviewed the resident?s history and exam and pertinent patient test results.  I agree with the assessment, diagnosis, and plan of care documented in the resident?s note.  ?

## 2022-04-19 ENCOUNTER — Telehealth: Payer: Self-pay

## 2022-04-19 NOTE — Telephone Encounter (Signed)
Pa for pt ( NURTEC  TAB ) came though on cover my meds .. was submitted with office notes .. awaiting approval or denial  ?

## 2022-04-19 NOTE — Telephone Encounter (Signed)
DECISION : ? ? ? ?Approved today ? ? ?Approved. This drug has been approved.  ? ?Approved quantity: 8 units per 25 day(s). ? ? ? The drug has been approved from 04/05/2022 to 07/18/2022.  ? ? ?Please call the pharmacy to process your prescription claim. ? ? Generic or biosimilar substitution may be required when available and preferred on the formulary. ? ? ?Drug ?Nurtec 75MG  dispersible tablets ? ? ? ?( COPY SENT TO PHARMACY ALSO )  ?

## 2022-06-09 ENCOUNTER — Ambulatory Visit (INDEPENDENT_AMBULATORY_CARE_PROVIDER_SITE_OTHER): Payer: Medicaid Other | Admitting: Student

## 2022-06-09 ENCOUNTER — Encounter: Payer: Self-pay | Admitting: Student

## 2022-06-09 VITALS — BP 116/62 | HR 69 | Temp 98.2°F | Ht 63.0 in | Wt 250.7 lb

## 2022-06-09 DIAGNOSIS — F32A Depression, unspecified: Secondary | ICD-10-CM

## 2022-06-09 DIAGNOSIS — F419 Anxiety disorder, unspecified: Secondary | ICD-10-CM | POA: Diagnosis present

## 2022-06-09 DIAGNOSIS — F418 Other specified anxiety disorders: Secondary | ICD-10-CM | POA: Insufficient documentation

## 2022-06-09 MED ORDER — ESCITALOPRAM OXALATE 10 MG PO TABS
10.0000 mg | ORAL_TABLET | Freq: Every day | ORAL | 2 refills | Status: DC
Start: 2022-06-09 — End: 2022-09-06

## 2022-06-09 MED ORDER — HYDROXYZINE HCL 25 MG PO TABS
25.0000 mg | ORAL_TABLET | Freq: Three times a day (TID) | ORAL | 2 refills | Status: DC | PRN
Start: 2022-06-09 — End: 2023-02-02

## 2022-06-09 NOTE — Assessment & Plan Note (Addendum)
Presents to the clinic today for initial encounter for depression/anxiety.  She report an episode of atypical chest pain, shortness of breath and dizziness that started in January that she was evaluated in the emergency room.  Since then she has been having more frequent episodes with chest tightness, shortness of breath and anxiety.  Her most recent episode was 2 days ago.  She is unsure of the trigger of these events. 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  GAD 7 score of 17 which suggest severe anxiety.  She report general worry about her family members in the health.  She is not worried about her own health.  States that her birth mother has drug use issue and often calls her when she was in trouble, which put a lot of stress on patient.  She also often feels worry and being restless.  The symptoms has been going on for more than 6 months.  PHQ-9 score was 21 which suggest severe depression.  The symptom has been going on for many years.  She denies suicidal thoughts.  She has not seek help for her behavioral health issue in the past.  She does not talk to anybody in the family about her issues.  She report family history of schizophrenia but does not have obvious visual or auditory hallucinations.  She endorses flying thoughts but denies manic symptoms.  Patient present with both symptoms of depression and anxiety. Part of it can be due to her stressful job at a young age.  It seems that she does not have much family support except for her partner. She does not have any obvious symptoms of schizophrenia or bipolar.   -Start Lexapro 10 mg daily. -Hydroxyzine 25 mg every 8 as needed for panic attacks -Advised patient to reach out to [facility] for behavioral counseling -Follow-up in 4 weeks

## 2022-06-09 NOTE — Progress Notes (Signed)
CC: Evaluate for depression/anxiety  HPI:  [name] is a 21 y.o. with past medical history of migraine, menorrhalgia and obesity who presents to clinic today to evaluate for her depression/anxiety.  Please see problem based charting for detailed  Past Medical History:  Diagnosis Date   Allergy    Asthma    past hx   Family history of brain aneurysm- father died around age 65  04/18/2020   High risk heterosexual behavior 04-18-2020   History of anemia 2020-04-18   Migraine    Pelvic pain 04/18/20   Right lower quadrant pain 04-18-20   Review of Systems: Per HPI  Physical Exam:  Vitals:   06/09/22 1523  BP: 116/62  Pulse: 69  Temp: 98.2 F (36.8 C)  TempSrc: Oral  SpO2: 100%  Weight: 250 lb 11.2 oz (113.7 kg)  Height: 5\' 3"  (1.6 m)   Physical Exam Constitutional:      General: She is not in acute distress.    Appearance: She is not ill-appearing.  HENT:     Head: Normocephalic.  Eyes:     General:        Right eye: No discharge.        Left eye: No discharge.     Conjunctiva/sclera: Conjunctivae normal.  Cardiovascular:     Rate and Rhythm: Normal rate and regular rhythm.  Pulmonary:     Effort: Pulmonary effort is normal. No respiratory distress.     Breath sounds: Normal breath sounds.  Musculoskeletal:        General: Normal range of motion.  Skin:    General: Skin is warm.  Neurological:     Mental Status: She is alert and oriented to person, place, and time.  Psychiatric:        Mood and Affect: Mood normal.      Assessment & Plan:   See Encounters Tab for problem based charting.  Anxiety and depression Presents to the clinic today for initial encounter for depression/anxiety.  She report an episode of atypical chest pain, shortness of breath and dizziness that started in January that she was evaluated in the emergency room.  Since then she has been having more frequent episodes with chest tightness, shortness of breath and anxiety.  Her  most recent episode was 2 days ago.  She is unsure of the trigger of these events. 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  GAD 7 score of 17 which suggest severe anxiety.  She report general worry about her family members in the health.  She is not worried about her own health.  States that her birth mother has drug use issue and often calls her when she was in trouble, which put a lot of stress on patient.  She also often feels worry and being restless.  The symptoms has been going on for more than 6 months.  PHQ-9 score was 21 which suggest severe depression.  The symptom has been going on for many years.  She denies suicidal thoughts.  She has not seek help for her behavioral health issue in the past.  She does not talk to anybody in the family about her issues.  She report family history of schizophrenia but does not have obvious visual or auditory hallucinations.  She endorses flying thoughts but denies manic symptoms.  Patient present with both symptoms of depression and anxiety. Part of it can be due to her stressful job  at a young age.  It seems that she does not have much family support except for her partner. She does not have any obvious symptoms of schizophrenia or bipolar.   -Start Lexapro 10 mg daily. -Hydroxyzine 25 mg every 8 as needed for panic attacks -Advised patient to reach out to [facility] for behavioral counseling -Follow-up in 4 weeks   Patient discussed with Dr. Heide Spark

## 2022-06-09 NOTE — Patient Instructions (Signed)
Ms. Smestad,  It was a pleasure seeing you in the clinic today.  I will start a medication called Lexapro for your depression and anxiety.  I also added as needed hydroxyzine that you can take for a panic attack.  Regarding behavioral counseling, please call [facility] for an appointment. Phone: [phone]  Please return in 4 weeks  Take care  Dr. Cyndie Chime

## 2022-06-13 NOTE — Progress Notes (Signed)
Internal Medicine Clinic Attending  Case discussed with Dr. Nguyen  At the time of the visit.  We reviewed the resident's history and exam and pertinent patient test results.  I agree with the assessment, diagnosis, and plan of care documented in the resident's note. 

## 2022-06-30 ENCOUNTER — Ambulatory Visit
Admission: EM | Admit: 2022-06-30 | Discharge: 2022-06-30 | Disposition: A | Discharge status: Home / Self Care | Payer: Medicaid Other | Attending: Physician Assistant | Admitting: Physician Assistant

## 2022-06-30 ENCOUNTER — Ambulatory Visit (INDEPENDENT_AMBULATORY_CARE_PROVIDER_SITE_OTHER): Payer: Medicaid Other

## 2022-06-30 ENCOUNTER — Encounter: Payer: Self-pay | Admitting: Emergency Medicine

## 2022-06-30 DIAGNOSIS — S62666A Nondisplaced fracture of distal phalanx of right little finger, initial encounter for closed fracture: Secondary | ICD-10-CM

## 2022-06-30 DIAGNOSIS — M79605 Pain in left leg: Secondary | ICD-10-CM | POA: Diagnosis not present

## 2022-06-30 DIAGNOSIS — M79604 Pain in right leg: Secondary | ICD-10-CM

## 2022-06-30 MED ORDER — NAPROXEN 500 MG PO TABS: 500.0000 mg | ORAL_TABLET | Freq: Two times a day (BID) | ORAL | 0 refills | Status: DC

## 2022-06-30 NOTE — ED Provider Notes (Signed)
[facility]    CSN: [id number] Arrival date & time: 06/30/22  0802      History   Chief Complaint Chief Complaint  Patient presents with   Leg Pain    HPI Catherine [name] is a 21 y.o. female.   Patient is a 21 year old female who presents complaining of bilateral leg pain.  Patient states the pain was intense and woke her up overnight with the right leg cardio worse on the left.  Patient describes the pain as burning with a pulling sensation that went on most the night.  Patient also complaining of numbness to her right hand and states she had a couple falls recently and had concerns about either breaking or jamming her right pinky finger.  Patient denies any recent falls, injuries or other trauma to her legs or back.  Patient reports pain is centered around the knee and just above and below.  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    Past Medical History:  Diagnosis Date   Allergy    Asthma    past hx   Family history of brain aneurysm- father died around age 52  08-Apr-2020   High risk heterosexual behavior 2020/04/08   History of anemia 04-08-2020   Migraine    Pelvic pain Apr 08, 2020   Right lower quadrant pain 04/08/20    Patient Active Problem List   Diagnosis Date Noted   Anxiety and depression 06/09/2022   Vitamin D insufficiency 2020-04-08   Menorrhagia with irregular cycle Apr 08, 2020   Body mass index (BMI) of 40.1-44.9 in adult (HCC) 2020/04/08   Polyuria 04-08-2020   Migraine with aura and without status migrainosus, not intractable Apr 08, 2020   Acute non intractable tension-type headache 08/14/2018   Allergic rhinitis 04/01/2010   Childhood asthma, mild intermittent, uncomplicated 02/01/2007    Past Surgical History:  Procedure Laterality Date   TONSILLECTOMY Bilateral 08/11/2015   Procedure: CONTROL POST TONSILLECTOMY BLEEDING ;  Surgeon: Vernie Murders, MD;  Location: [facility];   Service: ENT;  Laterality: Bilateral;   TONSILLECTOMY AND ADENOIDECTOMY N/A 07/31/2015   Procedure: TONSILLECTOMY AND ADENOIDECTOMY;  Surgeon: Linus Salmons, MD;  Location: [facility];  Service: ENT;  Laterality: N/A;    OB History     Gravida  0   Para  0   Term  0   Preterm  0   AB  0   Living  0      SAB  0   IAB  0   Ectopic  0   Multiple  0   Live Births  0            Home Medications    Prior to Admission medications   Medication Sig Start Date End Date Taking? Authorizing Provider  escitalopram (LEXAPRO) 10 MG tablet Take 1 tablet (10 mg total) by mouth daily. 06/09/22 06/09/23 Yes Doran Stabler, DO  hydrOXYzine (ATARAX) 25 MG tablet Take 1 tablet (25 mg total) by mouth 3 (three) times daily as needed. 06/09/22  Yes Doran Stabler, DO  meclizine (ANTIVERT) 12.5 MG tablet Take 1 tablet (12.5 mg total) by mouth 3 (three) times daily as needed for dizziness. 12/13/21  Yes White, Adrienne R, NP  naproxen (NAPROSYN) 500 MG tablet Take 1 tablet (500 mg total) by mouth 2 (two) times daily. 06/30/22  Yes Candis Schatz, PA-C  ipratropium (ATROVENT) 0.06 % nasal spray Place 2 sprays into both  nostrils 4 (four) times daily. 11/30/21   Becky Augusta, NP  Multiple Vitamins-Minerals (MULTIVITAMIN WITH MINERALS) tablet Take 1 tablet by mouth daily. Patient not taking: Reported on 07/29/2021 06/05/20   Federico Flake, MD  naproxen sodium (ALEVE) 220 MG tablet Take 220 mg by mouth.    [provider]  Rimegepant Sulfate (NURTEC) 75 MG TBDP Take 75 mg by mouth daily as needed for up to 1 dose (For migraines. Take when migraine begins. Use no more than once per day). 04/14/22   Ellison Carwin, MD    Family History Family History  Problem Relation Age of Onset   Hypertension Father    Aneurysm Father    Hypertension Brother    Diabetes Brother    Breast cancer Maternal Grandmother    Cancer Maternal Grandmother    Lung disease Maternal Grandmother     Bipolar disorder Mother    Depression Mother    Cancer Mother    Schizophrenia Mother    Aneurysm Paternal Grandfather    Heart attack Maternal Grandfather     Social History Social History   Tobacco Use   Smoking status: Never    Passive exposure: Never   Smokeless tobacco: Never  Vaping Use   Vaping Use: Never used  Substance Use Topics   Alcohol use: Yes    Comment: once per month, 1-2 drinks   Drug use: Never     Allergies   Amoxicillin   Review of Systems Review of Systems as noted above in HPI.  Other systems reviewed and found to be negative   Physical Exam Triage Vital Signs ED Triage Vitals  Enc Vitals Group     BP 06/30/22 0824 (!) 139/98     Pulse Rate 06/30/22 0824 83     Resp 06/30/22 0824 16     Temp 06/30/22 0824 98.8 F (37.1 C)     Temp Source 06/30/22 0824 Oral     SpO2 06/30/22 0824 100 %     Weight --      Height --      Head Circumference --      Peak Flow --      Pain Score 06/30/22 0829 10     Pain Loc --      Pain Edu? --      Excl. in GC? --    No data found.  Updated Vital Signs BP (!) 139/98 (BP Location: Left Wrist)   Pulse 83   Temp 98.8 F (37.1 C) (Oral)   Resp 16   LMP  (LMP Unknown)   SpO2 100%     Physical Exam Constitutional:      Appearance: Normal appearance.  Musculoskeletal:        General: Normal range of motion.     Comments: Tenderness upon palpation of the knees bilaterally.  No effusions felt.  No tenderness to the patellar tendon below the knees bilaterally.  Some tenderness to palpation of the patellar tendon and quadricep muscles.  Some increased pain to the thigh muscles with extension of the knee.  Tenderness palpation of the distal right little finger.  State unable to fully make a fist.  Neurological:     General: No focal deficit present.     Mental Status: She is alert and oriented to person, place, and time.      UC Treatments / Results  Labs (all labs ordered are listed, but only  abnormal results are displayed) Labs Reviewed - No data to  display  EKG   Radiology DG Knee AP/LAT W/Sunrise Right  Result Date: 06/30/2022 CLINICAL DATA:  Larey Seat.  Right knee pain. EXAM: RIGHT KNEE 3 VIEWS COMPARISON:  None Available. FINDINGS: The joint spaces are maintained. No acute fracture. No osteochondral lesion. No joint effusion. IMPRESSION: No acute bony findings or joint effusion. Electronically Signed   By: Rudie Meyer M.D.   On: 06/30/2022 09:17   DG Knee AP/LAT W/Sunrise Left  Result Date: 06/30/2022 CLINICAL DATA:  Larey Seat. EXAM: LEFT KNEE 3 VIEWS COMPARISON:  None Available. FINDINGS: The joint spaces are maintained. No acute fracture. No osteochondral lesion. No joint effusion. IMPRESSION: No acute bony findings. Electronically Signed   By: Rudie Meyer M.D.   On: 06/30/2022 09:16   DG Finger Little Right  Result Date: 06/30/2022 CLINICAL DATA:  Larey Seat.  Left fifth digit pain. EXAM: RIGHT LITTLE FINGER 2+V COMPARISON:  None Available. FINDINGS: There is a small nondisplaced buckle type cortical fracture involving the distal phalanx. The joint spaces are maintained. No other fractures are identified. IMPRESSION: Nondisplaced buckle type cortical fracture involving the distal phalanx. Electronically Signed   By: Rudie Meyer M.D.   On: 06/30/2022 09:16    Procedures Procedures (including critical care time)  Medications Ordered in UC Medications - No data to display  Initial Impression / Assessment and Plan / UC Course  I have reviewed the triage vital signs and the nursing notes.  Pertinent labs & imaging results that were available during my care of the patient were reviewed by me and considered in my medical decision making (see chart for details).     Closed nondisplaced distal phalanx fracture of the right pinky.  We will buddy tape.  No acute injuries to the knees, likely inflammation of the patellar tendon.  Give her prescription for naproxen and recommend  heat. Final Clinical Impressions(s) / UC Diagnoses   Final diagnoses:  Closed nondisplaced fracture of distal phalanx of right little finger, initial encounter  Bilateral leg pain     Discharge Instructions      -There is a nondisplaced fracture of the end portion of the pinky finger on the right.  Recommend buddy taping it to the adjacent finger to keep it protected.  Would continue this for a couple weeks -No acute injuries noted on x-rays involving the knees.  This is most likely related to inflammation of the tendon attaching the knee To the upper and lower leg.  Recommend naproxen every 12 hours with heat. -With taking the Lexapro, if you begin having any abdominal discomfort while also taking the naproxen, recommend adding an over-the-counter PPI like Prilosec, Nexium, or Protonix to help protect the lining of your stomach.     ED Prescriptions     Medication Sig Dispense Auth. Provider   naproxen (NAPROSYN) 500 MG tablet Take 1 tablet (500 mg total) by mouth 2 (two) times daily. 30 tablet Candis Schatz, PA-C      PDMP not reviewed this encounter.   Candis Schatz, PA-C 06/30/22 0930

## 2022-06-30 NOTE — ED Triage Notes (Signed)
Pt c/o bilateral leg pain from her thigh to her shin started last night. The pain woke her out of her sleep with her right leg feeling worse.  She also has right pinky pain it feels like it is jammed. She report falling in [place] and was tripped by her dog hitting the same pinky finger on both occassions.

## 2022-06-30 NOTE — Discharge Instructions (Addendum)
-  There is a nondisplaced fracture of the end portion of the pinky finger on the right.  Recommend buddy taping it to the adjacent finger to keep it protected.  Would continue this for a couple weeks -No acute injuries noted on x-rays involving the knees.  This is most likely related to inflammation of the tendon attaching the knee To the upper and lower leg.  Recommend naproxen every 12 hours with heat. -With taking the Lexapro, if you begin having any abdominal discomfort while also taking the naproxen, recommend adding an over-the-counter PPI like Prilosec, Nexium, or Protonix to help protect the lining of your stomach.

## 2022-07-07 ENCOUNTER — Telehealth: Payer: Self-pay

## 2022-07-07 NOTE — Telephone Encounter (Signed)
Pa for pt ( NURTEC RENEWAL ) came through on cover my meds was submitted with last office notes ... Awaiting approval or denial

## 2022-07-07 NOTE — Telephone Encounter (Signed)
DECISION:    This drug has been approved.  Approved quantity: 8 units per 25 day(s).   The drug has been  approved from 06/23/2022 to 10/05/2022.    Please call the pharmacy to process your prescription    claim.  Generic or biosimilar substitution may be required when available and preferred on the formulary       ( COPY SENT TO PHARMACY ALSO )

## 2022-07-08 NOTE — Addendum Note (Signed)
Addended by: Elza Rafter on: 07/08/2022 09:34 AM   Modules accepted: Orders

## 2022-08-19 ENCOUNTER — Ambulatory Visit
Admission: EM | Admit: 2022-08-19 | Discharge: 2022-08-19 | Disposition: A | Discharge status: Home / Self Care | Payer: Medicaid Other | Attending: Emergency Medicine | Admitting: Emergency Medicine

## 2022-08-19 ENCOUNTER — Ambulatory Visit (INDEPENDENT_AMBULATORY_CARE_PROVIDER_SITE_OTHER): Payer: Medicaid Other

## 2022-08-19 DIAGNOSIS — M25572 Pain in left ankle and joints of left foot: Secondary | ICD-10-CM | POA: Diagnosis not present

## 2022-08-19 NOTE — ED Provider Notes (Signed)
[facility]    CSN: [id number] Arrival date & time: 08/19/22  1843      History   Chief Complaint Chief Complaint  Patient presents with   Ankle Pain    Left     HPI Catherine [name] is a 21 y.o. female.   Patient presents with left ankle pain and swelling beginning 1 day ago after externally rotating her ankle while she was walking her dog, endorses that she stepped wrong and when ankle rotated she heard a snapping sound.  Painful to bear weight and pain is elicited with extension.  Endorses a numbness along the lateral aspect of the foot and intermittent pain throughout the heel.  Pain and numbness is also present in the left fifth toe, range of motion is intact.  Has attempted use of naproxen which has been ineffective.  Denies prior injury.  Past Medical History:  Diagnosis Date   Allergy    Asthma    past hx   Family history of brain aneurysm- father died around age 64  2020-04-12   High risk heterosexual behavior 04/12/20   History of anemia Apr 12, 2020   Migraine    Pelvic pain 04-12-2020   Right lower quadrant pain Apr 12, 2020    Patient Active Problem List   Diagnosis Date Noted   Anxiety and depression 06/09/2022   Vitamin D insufficiency 2020-04-12   Menorrhagia with irregular cycle 04-12-20   Body mass index (BMI) of 40.1-44.9 in adult (HCC) 04/12/2020   Polyuria 04/12/2020   Migraine with aura and without status migrainosus, not intractable 2020/04/12   Acute non intractable tension-type headache 08/14/2018   Allergic rhinitis 04/01/2010   Childhood asthma, mild intermittent, uncomplicated 02/01/2007    Past Surgical History:  Procedure Laterality Date   TONSILLECTOMY Bilateral 08/11/2015   Procedure: CONTROL POST TONSILLECTOMY BLEEDING ;  Surgeon: Vernie Murders, MD;  Location: [facility];  Service: ENT;  Laterality: Bilateral;   TONSILLECTOMY AND ADENOIDECTOMY N/A 07/31/2015   Procedure: TONSILLECTOMY AND ADENOIDECTOMY;  Surgeon: Linus Salmons, MD;   Location: [facility];  Service: ENT;  Laterality: N/A;    OB History     Gravida  0   Para  0   Term  0   Preterm  0   AB  0   Living  0      SAB  0   IAB  0   Ectopic  0   Multiple  0   Live Births  0            Home Medications    Prior to Admission medications   Medication Sig Start Date End Date Taking? Authorizing Provider  escitalopram (LEXAPRO) 10 MG tablet Take 1 tablet (10 mg total) by mouth daily. 06/09/22 06/09/23 Yes Doran Stabler, DO  hydrOXYzine (ATARAX) 25 MG tablet Take 1 tablet (25 mg total) by mouth 3 (three) times daily as needed. 06/09/22  Yes Doran Stabler, DO  naproxen (NAPROSYN) 500 MG tablet Take 1 tablet (500 mg total) by mouth 2 (two) times daily. 06/30/22  Yes Candis Schatz, PA-C  naproxen sodium (ALEVE) 220 MG tablet Take 220 mg by mouth.   Yes [provider]  ipratropium (ATROVENT) 0.06 % nasal spray Place 2 sprays into both nostrils 4 (four) times daily. 11/30/21   Becky Augusta, NP  meclizine (ANTIVERT) 12.5 MG tablet Take 1 tablet (12.5 mg total) by mouth 3 (three) times daily as needed for dizziness. 12/13/21   Valinda Hoar, NP  Multiple Vitamins-Minerals (MULTIVITAMIN WITH MINERALS) tablet Take 1 tablet by mouth daily. Patient not taking: Reported on 07/29/2021 06/05/20   Federico Flake, MD  Rimegepant Sulfate (NURTEC) 75 MG TBDP Take 75 mg by mouth daily as needed for up to 1 dose (For migraines. Take when migraine begins. Use no more than once per day). 04/14/22   Ellison Carwin, MD    Family History Family History  Problem Relation Age of Onset   Hypertension Father    Aneurysm Father    Hypertension Brother    Diabetes Brother    Breast cancer Maternal Grandmother    Cancer Maternal Grandmother    Lung disease Maternal Grandmother    Bipolar disorder Mother    Depression Mother    Cancer Mother    Schizophrenia Mother    Aneurysm Paternal Grandfather    Heart attack Maternal Grandfather      Social History Social History   Tobacco Use   Smoking status: Never    Passive exposure: Never   Smokeless tobacco: Never  Vaping Use   Vaping Use: Never used  Substance Use Topics   Alcohol use: Yes    Comment: once per month, 1-2 drinks   Drug use: Yes    Types: Marijuana     Allergies   Amoxicillin   Review of Systems Review of Systems  Constitutional: Negative.   Respiratory: Negative.    Cardiovascular: Negative.   Musculoskeletal:  Positive for gait problem and myalgias. Negative for arthralgias, back pain, joint swelling, neck pain and neck stiffness.     Physical Exam Triage Vital Signs ED Triage Vitals  Enc Vitals Group     BP 08/19/22 1857 (!) 136/93     Pulse Rate 08/19/22 1857 63     Resp 08/19/22 1857 18     Temp 08/19/22 1857 98.3 F (36.8 C)     Temp Source 08/19/22 1857 Oral     SpO2 08/19/22 1857 98 %     Weight 08/19/22 1855 252 lb (114.3 kg)     Height 08/19/22 1855 5\' 3"  (1.6 m)     Head Circumference --      Peak Flow --      Pain Score 08/19/22 1854 8     Pain Loc --      Pain Edu? --      Excl. in GC? --    No data found.  Updated Vital Signs BP (!) 136/93 (BP Location: Left Arm)   Pulse 63   Temp 98.3 F (36.8 C) (Oral)   Resp 18   Ht 5\' 3"  (1.6 m)   Wt 252 lb (114.3 kg)   LMP 08/18/2022   SpO2 98%   BMI 44.64 kg/m   Visual Acuity Right Eye Distance:   Left Eye Distance:   Bilateral Distance:    Right Eye Near:   Left Eye Near:    Bilateral Near:     Physical Exam Constitutional:      Appearance: Normal appearance.  Eyes:     Extraocular Movements: Extraocular movements intact.  Musculoskeletal:     Comments: Tenderness and mild to moderate swelling is present over the lateral malleolus of the left ankle, no ecchymosis or deformity noted, range of motion is intact but pain is elicited with extension of the foot, able to bear weight but elicits pain, no tenderness ecchymosis, swelling or deformity present to  the left fifth toe, range of motion of the toes intact, sensation of the toes intact  and capillary refill is less than 3, 2+ dorsalis pedis and pedal pulse  Neurological:     Mental Status: She is alert and oriented to person, place, and time. Mental status is at baseline.  Psychiatric:        Mood and Affect: Mood normal.        Behavior: Behavior normal.      UC Treatments / Results  Labs (all labs ordered are listed, but only abnormal results are displayed) Labs Reviewed - No data to display  EKG   Radiology No results found.  Procedures Procedures (including critical care time)  Medications Ordered in UC Medications - No data to display  Initial Impression / Assessment and Plan / UC Course  I have reviewed the triage vital signs and the nursing notes.  Pertinent labs & imaging results that were available during my care of the patient were reviewed by me and considered in my medical decision making (see chart for details).  Acute left ankle pain  X-ray negative, discussed with patient, recommended consistent use of naproxen to reduce swelling, may use Tylenol additionally, use ice or heat over the affected area and to continue ankle brace for stability and support, given walker referral to orthopedics if symptoms persist past 2 weeks Final Clinical Impressions(s) / UC Diagnoses   Final diagnoses:  None   Discharge Instructions   None    ED Prescriptions   None    PDMP not reviewed this encounter.   Valinda Hoar, [place] 08/19/22 1934

## 2022-08-19 NOTE — Discharge Instructions (Signed)
Your x-ray today did not show injury to the bone of your ankle. Your pain is most likely being caused by irritation to the soft tissues, this should improve as time progresses.   Every morning and every evening for at least 5 days to help reduce inflammation that typically occurs with injury, this will help with your pain, may take Tylenol 500 to 1000 mg every 6 hours for additional comfort  You may apply heat or ice, whichever makes you feel better, to affected area in 15 minute intervals  You may continue activity as tolerated, there is no injury therefore, it is important that you continue to move around so you do not loose strength to the area  You may continue ankle brace for stability and support, wear whenever standing and walking  If symptoms persist past 2 weeks, you may follow up at urgent care or with orthopedic specialist for evaluation, an orthopedic doctor specializes in the bone, they may provide  management such as but not limited to imaging, long term medications and physical therapy

## 2022-08-19 NOTE — ED Triage Notes (Signed)
Pt is with her brother  Pt c/o left ankle pain  Pt states that she was walking her dog in [place] and stepped wrong and heard it snap.   Pt states that her ankle turned inward and placed her full weight down on her ankle.   Pt has swelling along the lateral side of the left ankle and states that it is painful to touch and move. Pt also has pain in the pinky toe when moving her toes.    Pt denies any pain going up toward the knee and top of the foot

## 2022-09-05 ENCOUNTER — Other Ambulatory Visit: Payer: Self-pay | Admitting: Student

## 2022-09-05 DIAGNOSIS — F419 Anxiety disorder, unspecified: Secondary | ICD-10-CM

## 2022-09-09 ENCOUNTER — Emergency Department: Payer: Medicaid Other

## 2022-09-09 ENCOUNTER — Emergency Department
Admission: EM | Admit: 2022-09-09 | Discharge: 2022-09-10 | Disposition: A | Discharge status: Home / Self Care | Payer: Medicaid Other | Attending: Emergency Medicine | Admitting: Emergency Medicine

## 2022-09-09 ENCOUNTER — Ambulatory Visit
Admission: EM | Admit: 2022-09-09 | Discharge: 2022-09-09 | Disposition: A | Discharge status: Home / Self Care | Payer: Medicaid Other | Attending: Family Medicine | Admitting: Family Medicine

## 2022-09-09 ENCOUNTER — Other Ambulatory Visit: Payer: Self-pay

## 2022-09-09 DIAGNOSIS — R1011 Right upper quadrant pain: Secondary | ICD-10-CM | POA: Diagnosis present

## 2022-09-09 DIAGNOSIS — Z7951 Long term (current) use of inhaled steroids: Secondary | ICD-10-CM | POA: Insufficient documentation

## 2022-09-09 DIAGNOSIS — K529 Noninfective gastroenteritis and colitis, unspecified: Secondary | ICD-10-CM | POA: Diagnosis not present

## 2022-09-09 DIAGNOSIS — K5900 Constipation, unspecified: Secondary | ICD-10-CM | POA: Diagnosis not present

## 2022-09-09 DIAGNOSIS — J45909 Unspecified asthma, uncomplicated: Secondary | ICD-10-CM | POA: Diagnosis not present

## 2022-09-09 DIAGNOSIS — D72829 Elevated white blood cell count, unspecified: Secondary | ICD-10-CM | POA: Insufficient documentation

## 2022-09-09 DIAGNOSIS — R109 Unspecified abdominal pain: Secondary | ICD-10-CM

## 2022-09-09 LAB — COMPREHENSIVE METABOLIC PANEL
ALT: 16 U/L (ref 0–44)
AST: 18 U/L (ref 15–41)
Albumin: 4.1 g/dL (ref 3.5–5.0)
Alkaline Phosphatase: 74 U/L (ref 38–126)
Anion gap: 9 (ref 5–15)
BUN: 7 mg/dL (ref 6–20)
CO2: 26 mmol/L (ref 22–32)
Calcium: 9.1 mg/dL (ref 8.9–10.3)
Chloride: 104 mmol/L (ref 98–111)
Creatinine, Ser: 0.69 mg/dL (ref 0.44–1.00)
GFR, Estimated: >60 mL/min (ref >60)
Glucose, Bld: 90 mg/dL (ref 70–99)
Potassium: 3.6 mmol/L (ref 3.5–5.1)
Sodium: 139 mmol/L (ref 135–145)
Total Bilirubin: 0.5 mg/dL (ref 0.3–1.2)
Total Protein: 7.8 g/dL (ref 6.5–8.1)

## 2022-09-09 LAB — URINALYSIS, ROUTINE W REFLEX MICROSCOPIC
Bilirubin Urine: NEGATIVE
Glucose, UA: NEGATIVE mg/dL
Hgb urine dipstick: NEGATIVE
Ketones, ur: NEGATIVE mg/dL
Leukocytes,Ua: NEGATIVE
Nitrite: NEGATIVE
Protein, ur: NEGATIVE mg/dL
Specific Gravity, Urine: 1.016 (ref 1.005–1.030)
pH: 5 (ref 5.0–8.0)

## 2022-09-09 LAB — CBC
HCT: 40.3 % (ref 36.0–46.0)
Hemoglobin: 12.4 g/dL (ref 12.0–15.0)
MCH: 23 pg — ABNORMAL LOW (ref 26.0–34.0)
MCHC: 30.8 g/dL (ref 30.0–36.0)
MCV: 74.6 fL — ABNORMAL LOW (ref 80.0–100.0)
Platelets: 432 10*3/uL — ABNORMAL HIGH (ref 150–400)
RBC: 5.4 MIL/uL — ABNORMAL HIGH (ref 3.87–5.11)
RDW: 16.8 % — ABNORMAL HIGH (ref 11.5–15.5)
WBC: 13.3 10*3/uL — ABNORMAL HIGH (ref 4.0–10.5)
nRBC: 0 % (ref 0.0–0.2)

## 2022-09-09 LAB — LIPASE, BLOOD: Lipase: 27 U/L (ref 11–51)

## 2022-09-09 LAB — POC URINE PREG, ED: Preg Test, Ur: NEGATIVE

## 2022-09-09 MED ORDER — FENTANYL CITRATE PF 50 MCG/ML IJ SOSY
50.0000 ug | PREFILLED_SYRINGE | Freq: Once | INTRAMUSCULAR | Status: AC
  Administered 2022-09-09: 50 ug via INTRAVENOUS
  Filled 2022-09-09: qty 1

## 2022-09-09 MED ORDER — KETOROLAC TROMETHAMINE 60 MG/2ML IM SOLN
30.0000 mg | Freq: Once | INTRAMUSCULAR | Status: AC
Start: 2022-09-09 — End: 2022-09-09
  Administered 2022-09-09: 30 mg via INTRAMUSCULAR

## 2022-09-09 MED ORDER — FAMOTIDINE IN NACL 20-0.9 MG/50ML-% IV SOLN
20.0000 mg | Freq: Once | INTRAVENOUS | Status: AC
  Administered 2022-09-09: 20 mg via INTRAVENOUS
  Filled 2022-09-09: qty 50

## 2022-09-09 MED ORDER — IOHEXOL 350 MG/ML SOLN
100.0000 mL | Freq: Once | INTRAVENOUS | Status: AC | PRN
  Administered 2022-09-09: 100 mL via INTRAVENOUS

## 2022-09-09 MED ORDER — SODIUM CHLORIDE 0.9 % IV BOLUS
1000.0000 mL | Freq: Once | INTRAVENOUS | Status: AC
  Administered 2022-09-09: 1000 mL via INTRAVENOUS

## 2022-09-09 MED ORDER — ONDANSETRON HCL 4 MG/2ML IJ SOLN
4.0000 mg | Freq: Once | INTRAMUSCULAR | Status: AC
  Administered 2022-09-09: 4 mg via INTRAVENOUS
  Filled 2022-09-09: qty 2

## 2022-09-09 NOTE — ED Triage Notes (Addendum)
Pt states earlier this morning started having RUQ pain while at work, pt states pain worse after eating. Pt reports nausea. Pt states on the way here pain started radiating into RT groin and now into back as well.

## 2022-09-09 NOTE — ED Notes (Signed)
Patient is being discharged from the Urgent Care and sent to the Emergency Department via private vehicle . Per Dr. Susa Simmonds, patient is in need of higher level of care due to severe RLQ abdominal pain. Patient is aware and verbalizes understanding of plan of care.  Vitals:   09/09/22 1916  BP: (!) 147/91  Pulse: 97  Temp: 98.2 F (36.8 C)  SpO2: 99%

## 2022-09-09 NOTE — Discharge Instructions (Addendum)

## 2022-09-09 NOTE — ED Triage Notes (Signed)
Pt arrives with c/o RUQ pain that started this morning. Pt endorses nausea. Per pt, pain is worse after eating.

## 2022-09-09 NOTE — ED Notes (Signed)
Pt requesting foot. Pt updated on need to remain NPO for ct scan. Pt verbalizes understanding.

## 2022-09-09 NOTE — ED Provider Notes (Signed)
[facility] Provider Note    Event Date/Time   First MD Initiated Contact with Patient 09/09/22 2305     (approximate)   History   Abdominal Pain   HPI  Catherine [name] is a 21 y.o. female referred to the emergency department from urgent care for right upper quadrant abdominal pain which began around 10 AM and worsened with eating [employer] around lunchtime.  Symptoms associated with nausea and pain on inspiration.  Denies fever, chest pain, shortness of breath, vomiting, dysuria or diarrhea.     Past Medical History   Past Medical History:  Diagnosis Date   Allergy    Asthma    past hx   Family history of brain aneurysm- father died around age 53  Apr 17, 2020   High risk heterosexual behavior 04-17-2020   History of anemia 04/17/2020   Migraine    Pelvic pain 04-17-20   Right lower quadrant pain 2020-04-17     Active Problem List   Patient Active Problem List   Diagnosis Date Noted   Anxiety and depression 06/09/2022   Vitamin D insufficiency 17-Apr-2020   Menorrhagia with irregular cycle 2020/04/17   Body mass index (BMI) of 40.1-44.9 in adult (HCC) 04-17-20   Polyuria 2020/04/17   Migraine with aura and without status migrainosus, not intractable 2020/04/17   Acute non intractable tension-type headache 08/14/2018   Allergic rhinitis 04/01/2010   Childhood asthma, mild intermittent, uncomplicated 02/01/2007     Past Surgical History   Past Surgical History:  Procedure Laterality Date   TONSILLECTOMY Bilateral 08/11/2015   Procedure: CONTROL POST TONSILLECTOMY BLEEDING ;  Surgeon: Vernie Murders, MD;  Location: [facility] OR[facility];  Service: ENT;  Laterality: Bilateral;   TONSILLECTOMY AND ADENOIDECTOMY N/A 07/31/2015   Procedure: TONSILLECTOMY AND ADENOIDECTOMY;  Surgeon: Linus Salmons, MD;  Location: [facility];  Service: ENT;  Laterality: N/A;     Home Medications   Prior to Admission medications   Medication Sig Start Date End Date  Taking? Authorizing Provider  escitalopram (LEXAPRO) 10 MG tablet Take 1 tablet by mouth once daily 09/06/22   Doran Stabler, DO  hydrOXYzine (ATARAX) 25 MG tablet Take 1 tablet (25 mg total) by mouth 3 (three) times daily as needed. 06/09/22   Doran Stabler, DO  ipratropium (ATROVENT) 0.06 % nasal spray Place 2 sprays into both nostrils 4 (four) times daily. 11/30/21   Becky Augusta, NP  meclizine (ANTIVERT) 12.5 MG tablet Take 1 tablet (12.5 mg total) by mouth 3 (three) times daily as needed for dizziness. 12/13/21   Valinda Hoar, NP  Multiple Vitamins-Minerals (MULTIVITAMIN WITH MINERALS) tablet Take 1 tablet by mouth daily. Patient not taking: Reported on 07/29/2021 06/05/20   Federico Flake, MD  naproxen (NAPROSYN) 500 MG tablet Take 1 tablet (500 mg total) by mouth 2 (two) times daily. 06/30/22   Candis Schatz, PA-C  naproxen sodium (ALEVE) 220 MG tablet Take 220 mg by mouth.    [provider]  Rimegepant Sulfate (NURTEC) 75 MG TBDP Take 75 mg by mouth daily as needed for up to 1 dose (For migraines. Take when migraine begins. Use no more than once per day). 04/14/22   Ellison Carwin, MD     Allergies  Amoxicillin   Family History   Family History  Problem Relation Age of Onset   Hypertension Father    Aneurysm Father    Hypertension Brother    Diabetes Brother    Breast cancer Maternal Grandmother  Cancer Maternal Grandmother    Lung disease Maternal Grandmother    Bipolar disorder Mother    Depression Mother    Cancer Mother    Schizophrenia Mother    Aneurysm Paternal Grandfather    Heart attack Maternal Grandfather      Physical Exam  Triage Vital Signs: ED Triage Vitals  Enc Vitals Group     BP 09/09/22 2010 134/87     Pulse Rate 09/09/22 2010 (!) 105     Resp 09/09/22 2010 16     Temp 09/09/22 2010 98.7 F (37.1 C)     Temp Source 09/09/22 2010 Oral     SpO2 09/09/22 2010 95 %     Weight 09/09/22 2011 250 lb (113.4 kg)     Height --       Head Circumference --      Peak Flow --      Pain Score 09/09/22 2011 8     Pain Loc --      Pain Edu? --      Excl. in GC? --     Updated Vital Signs: BP 118/77   Pulse 81   Temp 98.7 F (37.1 C) (Oral)   Resp 16   Wt 113.4 kg   LMP 08/18/2022 (Approximate)   SpO2 99%   BMI 44.29 kg/m    General: Awake, mild distress.  CV:  Tachycardic.  Good peripheral perfusion.  Resp:  Normal effort.  CTA B. Abd:  Moderately tender right upper quadrant without rebound or guarding.  No distention.  Other:  No truncal vesicles.   ED Results / Procedures / Treatments  Labs (all labs ordered are listed, but only abnormal results are displayed) Labs Reviewed  CBC - Abnormal; Notable for the following components:      Result Value   WBC 13.3 (*)    RBC 5.40 (*)    MCV 74.6 (*)    MCH 23.0 (*)    RDW 16.8 (*)    Platelets 432 (*)    All other components within normal limits  URINALYSIS, ROUTINE W REFLEX MICROSCOPIC - Abnormal; Notable for the following components:   Color, Urine YELLOW (*)    APPearance HAZY (*)    All other components within normal limits  LIPASE, BLOOD  COMPREHENSIVE METABOLIC PANEL  POC URINE PREG, ED     EKG  None   RADIOLOGY I have independently visualized and interpreted patient's ultrasound and CT scan as well as noted the radiology interpretation:  Ultrasound: No evidence of acute cholecystitis  CTA chest: No PE  CT abdomen/pelvis: Constipation, gastroenteritis, herniated disc  Official radiology report(s): CT Angio Chest PE W/Cm &/Or Wo Cm  Result Date: 09/10/2022 CLINICAL DATA:  Right chest and upper quadrant pain. EXAM: CT ANGIOGRAPHY CHEST CT ABDOMEN AND PELVIS WITH CONTRAST TECHNIQUE: Multidetector CT imaging of the chest was performed using the standard protocol during bolus administration of intravenous contrast. Multiplanar CT image reconstructions and MIPs were obtained to evaluate the vascular anatomy. Multidetector CT imaging of the  abdomen and pelvis was performed using the standard protocol during bolus administration of intravenous contrast. RADIATION DOSE REDUCTION: This exam was performed according to the departmental dose-optimization program which includes automated exposure control, adjustment of the mA and/or kV according to patient size and/or use of iterative reconstruction technique. CONTRAST:  OMNIPAQUE IOHEXOL 350 MG/ML SOLN COMPARISON:  Right upper quadrant ultrasound yesterday. Last chest film was PA Lat 12/29/2021 FINDINGS: CTA CHEST FINDINGS Cardiovascular: The cardiac size  is normal. Pulmonary arteries and veins are normal caliber and no arterial embolus is seen. There is no pericardial effusion. The aorta and great vessels are normal. Mediastinum/Nodes: No enlarged mediastinal, hilar, or axillary lymph nodes. The lower poles of the thyroid gland, the trachea, and esophagus demonstrate no significant findings. Lungs/Pleura: The lungs show mild central bronchial thickening without bronchial impactions or bronchopneumonia. There is posterior atelectasis in the lower lobes. No infiltrate or nodule are seen. Small linear scar-like opacity right middle lobe base. No pleural effusion, thickening or pneumothorax. Musculoskeletal: No chest wall abnormality. No acute or significant osseous findings. Review of the MIP images confirms the above findings. CT ABDOMEN and PELVIS FINDINGS Hepatobiliary: The liver is 18.5 cm in length slightly steatotic. No mass is seen. Unremarkable gallbladder and bile ducts. Pancreas: No abnormality. Spleen: No abnormality. No splenomegaly. 1.4 cm splenule alongside the splenic hilum. Adrenals/Urinary Tract: No adrenal or renal mass enhancement is seen. There is no urinary stone or obstruction. There is no bladder thickening allowing for degree of distention. Small urachal remnant of the anterior superior bladder is noted. Stomach/Bowel: There is mild fold thickening in the stomach, the distal  duodenum and jejunum consistent with gastroenteritis. There are no inflammatory changes or small-bowel obstruction. The appendix is normal caliber and well seen. There is mild constipation ascending colon. Scattered left colonic diverticula without diverticulitis. Vascular/Lymphatic: The aorta and portal vein are normal. No enlarged abdominal or pelvic lymph nodes are seen but there are clustered borderline prominent ileocolic mesenteric nodes up to 8 mm in short axis. Scattered pelvic phleboliths both sides. Reproductive: The uterus and ovaries are not enlarged. There is a dominant 2 cm follicle of the left ovary. Right ovary is unremarkable. Other: Small umbilical fat hernia. No incarcerated hernia. No free air, free hemorrhage or free fluid. Musculoskeletal: No fracture is seen. There is a left paracentral herniated disc at L4-5 with mass effect on the descending left L5 nerve root and moderate spinal canal stenosis. Review of the MIP images confirms the above findings. IMPRESSION: 1. Mild features of bronchitis with no acute chest CT or CTA findings. 2. No acute abdominal or pelvic process identified. 3. Slightly prominent and steatotic liver. 4. Constipation and diverticulosis. 5. Gastroenteritis without evidence of bowel obstruction or inflammation. 6. L4-5 left paracentral herniated disc with mass effect on the left L5 nerve root. 7. Remaining findings described above. Electronically Signed   By: Keith  Chesser M.D.   On: 09/10/2022 00:37   CT Abdomen Pelvis W Contrast  Result Date: 09/10/2022 CLINICAL DATA:  Right chest and upper quadrant pain. EXAM: CT ANGIOGRAPHY CHEST CT ABDOMEN AND PELVIS WITH CONTRAST TECHNIQUE: Multidetector CT imaging of the chest was performed using the standard protocol during bolus administration of intravenous contrast. Multiplanar CT image reconstructions and MIPs were obtained to evaluate the vascular anatomy. Multidetector CT imaging of the abdomen and pelvis was performed  using the standard protocol during bolus administration of intravenous contrast. RADIATION DOSE REDUCTION: This exam was performed according to the departmental dose-optimization program which includes automated exposure control, adjustment of the mA and/or kV according to patient size and/or use of iterative reconstruction technique. CONTRAST:  <MEA<MEASUREM<MEASURE<MEASURE<MEASUREMENT[address] 350 MG/ML SOLN COMPARISON:  Right upper quadrant ultrasound yesterday. Last chest film was PA Lat 12/29/2021 FINDINGS: CTA CHEST FINDINGS Cardiovascular: The cardiac size is normal. Pulmonary arteries and veins are normal caliber and no arterial embolus is seen. There is no pericardial effusion. The aorta and great vessels are normal. Mediastinum/Nodes: No  enlarged mediastinal, hilar, or axillary lymph nodes. The lower poles of the thyroid gland, the trachea, and esophagus demonstrate no significant findings. Lungs/Pleura: The lungs show mild central bronchial thickening without bronchial impactions or bronchopneumonia. There is posterior atelectasis in the lower lobes. No infiltrate or nodule are seen. Small linear scar-like opacity right middle lobe base. No pleural effusion, thickening or pneumothorax. Musculoskeletal: No chest wall abnormality. No acute or significant osseous findings. Review of the MIP images confirms the above findings. CT ABDOMEN and PELVIS FINDINGS Hepatobiliary: The liver is 18.5 cm in length slightly steatotic. No mass is seen. Unremarkable gallbladder and bile ducts. Pancreas: No abnormality. Spleen: No abnormality. No splenomegaly. 1.4 cm splenule alongside the splenic hilum. Adrenals/Urinary Tract: No adrenal or renal mass enhancement is seen. There is no urinary stone or obstruction. There is no bladder thickening allowing for degree of distention. Small urachal remnant of the anterior superior bladder is noted. Stomach/Bowel: There is mild fold thickening in the stomach, the distal duodenum and jejunum consistent with  gastroenteritis. There are no inflammatory changes or small-bowel obstruction. The appendix is normal caliber and well seen. There is mild constipation ascending colon. Scattered left colonic diverticula without diverticulitis. Vascular/Lymphatic: The aorta and portal vein are normal. No enlarged abdominal or pelvic lymph nodes are seen but there are clustered borderline prominent ileocolic mesenteric nodes up to 8 mm in short axis. Scattered pelvic phleboliths both sides. Reproductive: The uterus and ovaries are not enlarged. There is a dominant 2 cm follicle of the left ovary. Right ovary is unremarkable. Other: Small umbilical fat hernia. No incarcerated hernia. No free air, free hemorrhage or free fluid. Musculoskeletal: No fracture is seen. There is a left paracentral herniated disc at L4-5 with mass effect on the descending left L5 nerve root and moderate spinal canal stenosis. Review of the MIP images confirms the above findings. IMPRESSION: 1. Mild features of bronchitis with no acute chest CT or CTA findings. 2. No acute abdominal or pelvic process identified. 3. Slightly prominent and steatotic liver. 4. Constipation and diverticulosis. 5. Gastroenteritis without evidence of bowel obstruction or inflammation. 6. L4-5 left paracentral herniated disc with mass effect on the left L5 nerve root. 7. Remaining findings described above. Electronically Signed   By: Almira Bar M.D.   On: 09/10/2022 00:37   US Abdomen Limited RUQ (LIVER/GB)  Result Date: 09/09/2022 CLINICAL DATA:  Right upper quadrant pain EXAM: ULTRASOUND ABDOMEN LIMITED RIGHT UPPER QUADRANT COMPARISON:  None Available. FINDINGS: Gallbladder: Gallbladder is contracted, likely physiologic as the patient ate at 6:30 p.m. No wall thickening or pericholecystic edema. No stones identified. Murphy's sign is negative. Common bile duct: Diameter: 3 mm, normal Liver: No focal lesion identified. Within normal limits in parenchymal echogenicity.  Portal vein is patent on color Doppler imaging with normal direction of blood flow towards the liver. Other: None. IMPRESSION: Contracted gallbladder, likely physiologic. No sonographic evidence of acute cholecystitis. Electronically Signed   By: Burman Nieves M.D.   On: 09/09/2022 21:19     PROCEDURES:  Critical Care performed: No  Procedures   MEDICATIONS ORDERED IN ED: Medications  sodium chloride 0.9 % bolus 1,000 mL (1,000 mLs Intravenous New Bag/Given 09/09/22 2347)  ondansetron (ZOFRAN) injection 4 mg (4 mg Intravenous Given 09/09/22 2348)  fentaNYL (SUBLIMAZE) injection 50 mcg (50 mcg Intravenous Given 09/09/22 2348)  famotidine (PEPCID) IVPB 20 mg premix (0 mg Intravenous Stopped 09/10/22 0018)  iohexol (OMNIPAQUE) 350 MG/ML injection 100 mL (100 mLs Intravenous Contrast Given 09/09/22 2358)  IMPRESSION / MDM / ASSESSMENT AND PLAN / ED COURSE  I reviewed the triage vital signs and the nursing notes.                             21 year old female referred to the ED for further evaluation of right upper quadrant abdominal pain. Differential diagnosis includes, but is not limited to, biliary disease (biliary colic, acute cholecystitis, cholangitis, choledocholithiasis, etc), intrathoracic causes for epigastric abdominal pain including ACS, gastritis, duodenitis, pancreatitis, small bowel or large bowel obstruction, abdominal aortic aneurysm, hernia, and ulcer(s).  I have personally reviewed patient's records and note her urgent care visit from earlier today.  Patient's presentation is most consistent with acute presentation with potential threat to life or bodily function.  The patient is on the cardiac monitor to evaluate for evidence of arrhythmia and/or significant heart rate changes.  Patient was given Toradol at urgent care with mild improvement of symptoms but still complains of pain.  Will administer IV fentanyl and Pepcid, IV Zofran for nausea.  Laboratory results  demonstrate moderate leukocytosis WBC 13, unremarkable CMP/lipase, negative urine.  Ultrasound negative for acute cholecystitis.  We will proceed to CT abdomen/pelvis to evaluate for intra-abdominal etiology.  We will also obtain CTA chest to evaluate for PE for complaints of pain on deep inspiration.  Will reassess.  Clinical Course as of 09/10/22 0107  Sat Sep 10, 2022  0105 Patient sleeping soundly in no acute distress.  Updated patient and family member of CT imaging results.  Will discharge home with as needed Bentyl and Zofran and patient will follow-up with her PCP closely next week.  Strict return precautions given.  Both verbalized understanding agree with plan of care. [JS]    Clinical Course User Index [JS] Paulette Blanch, MD     FINAL CLINICAL IMPRESSION(S) / ED DIAGNOSES   Final diagnoses:  Right upper quadrant abdominal pain  Constipation, unspecified constipation type  Gastroenteritis     Rx / DC Orders   ED Discharge Orders     None        Note:  This document was prepared using Dragon voice recognition software and may include unintentional dictation errors.   Paulette Blanch, MD 09/10/22 [phone]

## 2022-09-09 NOTE — ED Provider Notes (Signed)
[facility]    CSN: [id number] Arrival date & time: 09/09/22  1905      History   Chief Complaint Chief Complaint  Patient presents with   Abdominal Pain    RUQ   Nausea    HPI Catherine [name] is a 21 y.o. female.   HPI  Catherine [name] presents for intermittent abdominal pain.  Reports early this morning she started having RUQ abdominal pain. She tried to get throuigh her day but the [pain was still. The pain got worse after eating about 45 minutes ago. She has no history of kidney stones. Pain radiates to mid back and lower groin. She says she can't get comfortable at home. No similar symptoms previously.  She has not had any surgeries on her belly.  Nothing makes pain better. She has been drinking lots of fluids. Pain "8.5/10" rated and described a "sharp contracting pain."    Symptoms Nausea/Vomiting: nausea no vomiting   Diarrhea: loose stools   Constipation: no  Melena/BRBPR: no  Hematemesis: no  Anorexia: no  Fever/Chills: yes to chills  Dysuria: no  Hematuria: no Rash: no  Wt loss: no  EtOH use: >than a week ago   NSAIDs/ASA: Naprosyn stopped a week or two ago   LMP: Patient's last menstrual period was 08/18/2022 (approximate). Contraception: no  Vaginal bleeding: no  Sore throat: no Cough: no Nasal congestion : no  Sleep disturbance: no Back Pain: no Headache: yes, chronic    Past Medical History:  Diagnosis Date   Allergy    Asthma    past hx   Family history of brain aneurysm- father died around age 16  04/07/20   High risk heterosexual behavior 04/07/2020   History of anemia 2020/04/07   Migraine    Pelvic pain 04-07-20   Right lower quadrant pain 04-07-20    Patient Active Problem List   Diagnosis Date Noted   Anxiety and depression 06/09/2022   Vitamin D insufficiency 2020/04/07   Menorrhagia with irregular cycle 04/07/20   Body mass index (BMI) of 40.1-44.9 in adult (HCC) 04-07-2020   Polyuria 04-07-2020   Migraine with aura and  without status migrainosus, not intractable Apr 07, 2020   Acute non intractable tension-type headache 08/14/2018   Allergic rhinitis 04/01/2010   Childhood asthma, mild intermittent, uncomplicated 02/01/2007    Past Surgical History:  Procedure Laterality Date   TONSILLECTOMY Bilateral 08/11/2015   Procedure: CONTROL POST TONSILLECTOMY BLEEDING ;  Surgeon: Vernie Murders, MD;  Location: [facility];  Service: ENT;  Laterality: Bilateral;   TONSILLECTOMY AND ADENOIDECTOMY N/A 07/31/2015   Procedure: TONSILLECTOMY AND ADENOIDECTOMY;  Surgeon: Linus Salmons, MD;  Location: [facility];  Service: ENT;  Laterality: N/A;    OB History     Gravida  0   Para  0   Term  0   Preterm  0   AB  0   Living  0      SAB  0   IAB  0   Ectopic  0   Multiple  0   Live Births  0            Home Medications    Prior to Admission medications   Medication Sig Start Date End Date Taking? Authorizing Provider  escitalopram (LEXAPRO) 10 MG tablet Take 1 tablet by mouth once daily 09/06/22  Yes Doran Stabler, DO  hydrOXYzine (ATARAX) 25 MG tablet Take 1 tablet (25 mg total) by mouth 3 (three) times daily as needed. 06/09/22  Yes Doran Stabler, DO  meclizine (ANTIVERT) 12.5 MG tablet Take 1 tablet (12.5 mg total) by mouth 3 (three) times daily as needed for dizziness. 12/13/21  Yes White, Adrienne R, NP  ipratropium (ATROVENT) 0.06 % nasal spray Place 2 sprays into both nostrils 4 (four) times daily. 11/30/21   Becky Augusta, NP  Multiple Vitamins-Minerals (MULTIVITAMIN WITH MINERALS) tablet Take 1 tablet by mouth daily. Patient not taking: Reported on 07/29/2021 06/05/20   Federico Flake, MD  naproxen (NAPROSYN) 500 MG tablet Take 1 tablet (500 mg total) by mouth 2 (two) times daily. 06/30/22   Candis Schatz, PA-C  naproxen sodium (ALEVE) 220 MG tablet Take 220 mg by mouth.    [provider]  Rimegepant Sulfate (NURTEC) 75 MG TBDP Take 75 mg by mouth daily as needed for up  to 1 dose (For migraines. Take when migraine begins. Use no more than once per day). 04/14/22   Ellison Carwin, MD    Family History Family History  Problem Relation Age of Onset   Hypertension Father    Aneurysm Father    Hypertension Brother    Diabetes Brother    Breast cancer Maternal Grandmother    Cancer Maternal Grandmother    Lung disease Maternal Grandmother    Bipolar disorder Mother    Depression Mother    Cancer Mother    Schizophrenia Mother    Aneurysm Paternal Grandfather    Heart attack Maternal Grandfather     Social History Social History   Tobacco Use   Smoking status: Never    Passive exposure: Never   Smokeless tobacco: Never  Vaping Use   Vaping Use: Never used  Substance Use Topics   Alcohol use: Yes    Comment: once per month, 1-2 drinks   Drug use: Yes    Types: Marijuana     Allergies   Amoxicillin   Review of Systems Review of Systems :negative unless otherwise stated in HPI.      Physical Exam Triage Vital Signs ED Triage Vitals  Enc Vitals Group     BP 09/09/22 1916 (!) 147/91     Pulse Rate 09/09/22 1916 97     Resp --      Temp 09/09/22 1916 98.2 F (36.8 C)     Temp Source 09/09/22 1916 Oral     SpO2 09/09/22 1916 99 %     Weight 09/09/22 1915 250 lb (113.4 kg)     Height 09/09/22 1915 5\' 3"  (1.6 m)     Head Circumference --      Peak Flow --      Pain Score 09/09/22 1915 8     Pain Loc --      Pain Edu? --      Excl. in GC? --    No data found.  Updated Vital Signs BP (!) 147/91 (BP Location: Right Arm)   Pulse 97   Temp 98.2 F (36.8 C) (Oral)   Ht 5\' 3"  (1.6 m)   Wt 113.4 kg   LMP 08/18/2022 (Approximate)   SpO2 99%   BMI 44.29 kg/m   Visual Acuity Right Eye Distance:   Left Eye Distance:   Bilateral Distance:    Right Eye Near:   Left Eye Near:    Bilateral Near:     Physical Exam  GEN: uncomfortably appearing female, writhing around in pain  CV: regular rate and rhythm RESP: no increased  work of breathing, clear to ascultation bilaterally ABD:  Bowel sounds present. Soft, RUQ and RLQ tenderness, right CVA tenderness, non-distended.  No guarding, no rebound, no appreciable hepatosplenomegaly,unable to perform McBurney's or Murphy's as patient unable to sit still long enough for exam MSK: no extremity edema SKIN: warm, dry, no rash on visible skin NEURO: alert, moves all extremities appropriately PSYCH: Normal affect, appropriate speech and behavior   UC Treatments / Results  Labs (all labs ordered are listed, but only abnormal results are displayed) Labs Reviewed - No data to display  EKG   Radiology No results found.  Procedures Procedures (including critical care time)  Medications Ordered in UC Medications  ketorolac (TORADOL) injection 30 mg (30 mg Intramuscular Given 09/09/22 1940)    Initial Impression / Assessment and Plan / UC Course  I have reviewed the triage vital signs and the nursing notes.  Pertinent labs & imaging results that were available during my care of the patient were reviewed by me and considered in my medical decision making (see chart for details).       Patient is a  21 y.o. female who presents after having insidious moderate to severe abdominal pain today.  Patient is uncomfortable-appearing.  She is mildly hypertensive.  Suspect this is due to pain.  Shanais afebrile.  I am concerned that she has a kidney stone or acute abdominal pathology.  Evaluation was truncated here.  She was given Toradol for pain relief.  Advised patient to be evaluated in the ED due to her acute symptoms and we do not have ultrasound or CT available to [place] at this hour.  Called the ED at [facility] and spoke with the charge/triage nurse who will await patient's arrival.    Patient elected to travel by private vehicle to the emergency department.  Her female friend/family member will take her there.  Discussed MDM, treatment plan and plan for follow-up with  patient/parent who agrees with plan.    Final Clinical Impressions(s) / UC Diagnoses   Final diagnoses:  Flank pain     Discharge Instructions      You have been advised to follow up immediately in the emergency department for concerning signs or symptoms as discussed during your visit. If you declined EMS transport, please have a family member take you directly to the ED at this time. Do not delay.   Based on concerns about condition, if you do not follow up in the ED, you may risk poor outcomes including worsening of condition, delayed treatment and potentially life threatening issues. If you have declined to go to the ED at this time, you should call your PCP immediately to set up a follow up appointment.   Go to ED for red flag symptoms, including; fevers you cannot reduce with Tylenol/Motrin, severe headaches, vision changes, numbness/weakness in part of the body, lethargy, confusion, intractable vomiting, severe dehydration, chest pain, breathing difficulty, severe persistent abdominal or pelvic pain, signs of severe infection (increased redness, swelling of an area), feeling faint or passing out, dizziness, etc. You should especially go to the ED for sudden acute worsening of condition if you do not elect to go at this time.       ED Prescriptions   None    PDMP not reviewed this encounter.   Lyndee Hensen, DO 09/09/22 1958

## 2022-09-10 MED ORDER — ONDANSETRON 4 MG PO TBDP: 4.0000 mg | ORAL_TABLET | Freq: Three times a day (TID) | ORAL | 0 refills | Status: DC | PRN

## 2022-09-10 MED ORDER — DICYCLOMINE HCL 20 MG PO TABS: 20.0000 mg | ORAL_TABLET | Freq: Four times a day (QID) | ORAL | 0 refills | Status: DC

## 2022-09-10 NOTE — Discharge Instructions (Signed)
1.  You may take medicines as needed for abdominal discomfort and nausea/vomiting (Bentyl/Zofran #20). 2.  Clear liquids x12 hours, then [employer] x3 days, then slowly advance diet as tolerated. 3.  Return to the ER for worsening symptoms, persistent vomiting, difficulty breathing or other concerns.

## 2022-09-22 ENCOUNTER — Ambulatory Visit (INDEPENDENT_AMBULATORY_CARE_PROVIDER_SITE_OTHER): Payer: Medicaid Other | Admitting: Family Medicine

## 2022-09-22 ENCOUNTER — Encounter: Payer: Self-pay | Admitting: Family Medicine

## 2022-09-22 VITALS — BP 126/78 | HR 76 | Wt 248.7 lb

## 2022-09-22 DIAGNOSIS — M5416 Radiculopathy, lumbar region: Secondary | ICD-10-CM

## 2022-09-22 DIAGNOSIS — F419 Anxiety disorder, unspecified: Secondary | ICD-10-CM

## 2022-09-22 DIAGNOSIS — Z6841 Body Mass Index (BMI) 40.0 and over, adult: Secondary | ICD-10-CM

## 2022-09-22 DIAGNOSIS — E559 Vitamin D deficiency, unspecified: Secondary | ICD-10-CM

## 2022-09-22 DIAGNOSIS — K219 Gastro-esophageal reflux disease without esophagitis: Secondary | ICD-10-CM

## 2022-09-22 DIAGNOSIS — M5441 Lumbago with sciatica, right side: Secondary | ICD-10-CM

## 2022-09-22 DIAGNOSIS — M5442 Lumbago with sciatica, left side: Secondary | ICD-10-CM

## 2022-09-22 DIAGNOSIS — F32A Depression, unspecified: Secondary | ICD-10-CM

## 2022-09-22 DIAGNOSIS — M5126 Other intervertebral disc displacement, lumbar region: Secondary | ICD-10-CM

## 2022-09-22 DIAGNOSIS — M5116 Intervertebral disc disorders with radiculopathy, lumbar region: Secondary | ICD-10-CM | POA: Insufficient documentation

## 2022-09-22 MED ORDER — NAPROXEN 500 MG PO TABS: 500.0000 mg | ORAL_TABLET | Freq: Two times a day (BID) | ORAL | 2 refills | Status: DC

## 2022-09-22 MED ORDER — NAPROXEN 500 MG PO TABS: 500.0000 mg | ORAL_TABLET | Freq: Two times a day (BID) | ORAL | 0 refills | Status: AC

## 2022-09-22 MED ORDER — DULOXETINE HCL 30 MG PO CPEP: 30.0000 mg | ORAL_CAPSULE | Freq: Every day | ORAL | 2 refills | Status: DC

## 2022-09-22 NOTE — Progress Notes (Signed)
Internal Medicine Clinic Attending  I saw and evaluated the patient.  I personally confirmed the key portions of the history and exam documented by Dr. Multani and I reviewed pertinent patient test results.  The assessment, diagnosis, and plan were formulated together and I agree with the documentation in the resident's note.  

## 2022-09-22 NOTE — Assessment & Plan Note (Addendum)
Patient presented to the clinic with boyfriend with complaints of lower back pain ongoing for 2 years.  Patient reports pain is on and off localized to center of the lower back and sometimes radiate across the low back and down to both legs up to the heels with tingling and numbness.  Patient reports some days the pain is worse than other.  Patient takes ibuprofen as needed to help with the pain with short-term relief in symptoms.  Patient denies changes in urinary or bowel habits.  Denies weakness in the lower legs.  Patient denies injury or trauma.  Patient works in [employer] and her job requires prolonged standing and lifting, pulling and pushing moderately heavy items.  Patient unsure if the back pain is related to her job duties.  Patient for recently seen in the emergency department for abdominal pain.  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   Plan: Pharmacological and conservative management to help with radiculopathy and pain.  -Start Duloxetine (Stop Escitalopram) -Naprosyn -Referral to PT -Reevaluate x 4 weeks. If no improvement in Sx, MRI LS and possible referral to Neurosurgery for further evaluation and management.

## 2022-09-22 NOTE — Assessment & Plan Note (Signed)
Low Vitamin D levels 10.2 in 04/2020.  Plan: Repeat Vitamin D today

## 2022-09-22 NOTE — Assessment & Plan Note (Addendum)
Patient reports epigastric discomfort associated with nausea and 3 episodes of vomit Sunday. Denies blood in the vomitus.  Denies abdominal pain, fever or diarrhea. Denies urinary Sx.  Vomiting resolved spontaneously however patient continued to experience epigastric and chest burning with dyspepsia. Symptoms are more pronounced after eating however improve couple hours after eating.  Patient were recently evaluated in the emergency department for right upper quadrant abdominal pain which was then radiating down to right lower quadrant and to right flank.  CT/CTA abdomen pelvis demonstrated gastroenteritis without evidence of bowel obstruction or inflammation.  Slightly prominent and steatotic liver.  Symptoms improved in the ED and patient was discharged with prescription of dicyclomine and Zofran. Symptoms bounced back Sunday with dyspepsia. Pt describes "stomach acid comes up and cause burning sensation in the chest". Denies dysphagia or odynophagia. Denies abdominal pain, fever or diarrhea. She is able to eat and keep food down. Patient have not tried anything OTC to help with symptoms.   Abdomen soft, nontender.  Bowel sounds present all 4 quadrant.  No guarding or rebound tenderness.  Since patient recent visit to ED ruled out acute cholecystitis or biliary disease.  CT findings were consistent with  gastrointestinal infection.  I am suspecting patient still have lingering symptoms from viral gastroenteritis.  After the exam I do not have concerns for acute abdomen or bacterial GI infection. Dyspepsia could also be from eating large meals(pt admitted) and obesity. Pt counseled to consume meal in small portion. Educated on healthy food choices and exercise to loose weight which may also help with reflux   Plan: -Eat small meals -Pepcid 10 mg once a day, if Sx do not improve may take 10 mg twice daily.

## 2022-09-22 NOTE — Assessment & Plan Note (Signed)
BMI 44.  Patient was consulted on weight loss.  Patient was educated on diet modification and exercise 30 minutes 5 times a week.  Low-fat and low-carb diet. Patient agreed to monitor diet and will engage in exercise. Plan: Hemoglobin A1c

## 2022-09-22 NOTE — Assessment & Plan Note (Signed)
Patient on escitalopram 10 mg for anxiety and depression.  Since we are considering duloxetine to help with the number radiculopathy, the same medicine will also address anxiety and depression.  It was discussed with the patient to switch from escitalopram to duloxetine.  Patient agreed with the plan.  We will start with duloxetine 30 mg once daily.  Patient will be reevaluated in 4 weeks.  The dose will be titrated if needed after reevaluation on the follow-up visit. Plan: Stop: Escitalopram 10 mg Start: Duloxetine 30 mg

## 2022-09-22 NOTE — Patient Instructions (Addendum)
Today we spoke about back pain and reflux. Start Pepcid over-the-counter as we discussed. We did blood work to check vitamin D and hemoglobin A1c Prescription for naprosyn and duloxetine was sent to the pharmacy on file.  You will STOP LEXAPRO. Referral for PT was sent Follow-up in 1 month

## 2022-09-22 NOTE — Progress Notes (Signed)
CC: Low back pain and GERD  HPI: [name] is a 21 y.o. female with past medical history listed below presenting to [facility] for evaluation of back pain and GERD . For details of today's visit and the status of his chronic medical issues please refer to the assessment and plan.  Patient presented to the clinic with boyfriend with complaints of lower back pain ongoing for 2 years.  Patient reports pain is on and off localized to center of the lower back and sometimes radiate across the low back and down to both legs up to the heels with tingling and numbness.  Patient reports some days the pain is worse than other.  Patient takes ibuprofen as needed to help with the pain with short-term relief in symptoms.  Patient denies changes in urinary or bowel habits.  Denies weakness in the lower legs.  Patient denies injury or trauma.  Patient works in [employer] and her job requires prolonged standing and lifting, pulling and pushing moderately heavy items.  Patient unsure if the back pain is related to her job duties.  Patient for recently seen in the emergency department for abdominal pain.  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  Patient's back pain is consistent with the CT findings of disc herniation with impingement of the nerve root.  Patient reports epigastric discomfort associated with nausea and 3 episodes of vomit Sunday. Denies blood in the vomitus.  Denies abdominal pain, fever or diarrhea. Denies urinary Sx.  Vomiting resolved spontaneously however patient continued to experience epigastric and chest burning with dyspepsia. Symptoms are more pronounced after eating however improve couple hours after eating.  Patient were recently evaluated in the emergency department for right upper quadrant abdominal pain which was then radiating down to right lower quadrant and to right flank.  CT/CTA abdomen pelvis  demonstrated gastroenteritis without evidence of bowel obstruction or inflammation.  Slightly prominent and steatotic liver.  Symptoms improved in the ED and patient was discharged with prescription of dicyclomine and Zofran. Symptoms bounced back Sunday with dyspepsia. Pt describes "stomach acid comes up and cause burning sensation in the chest". Denies dysphagia or odynophagia. Denies abdominal pain, fever or diarrhea. She is able to eat and keep food down. Patient have not tried anything OTC to help with symptoms.    Patient is sexually active, monogamous in sexual relationship. LMP on August 18, 2022  Patient reports her periods are irregular. She took home pregnancy test on October 5 with negative results. Pt denies concerns for pregnancy or STI's.    Past Medical History:  Diagnosis Date   Allergy    Asthma    past hx   Family history of brain aneurysm- father died around age 20  May 02, 2020   High risk heterosexual behavior 05/02/20   History of anemia 2020-05-02   Migraine    Pelvic pain May 02, 2020   Right lower quadrant pain 05/02/20   Review of Systems: Review of Systems  Constitutional:  Negative for fever and malaise/fatigue.  Respiratory:  Negative for shortness of breath.   Cardiovascular:  Negative for chest pain.  Gastrointestinal:  Positive for heartburn and nausea. Negative for abdominal pain, constipation, diarrhea and vomiting.  Genitourinary:  Negative for dysuria, flank pain, frequency and urgency.  Musculoskeletal:  Positive for back pain.  Neurological:  Negative for dizziness and headaches.     Physical Exam:Physical Exam Vitals and nursing note  reviewed.  Constitutional:      Appearance: Normal appearance.  Eyes:     Conjunctiva/sclera: Conjunctivae normal.  Cardiovascular:     Rate and Rhythm: Normal rate and regular rhythm.  Pulmonary:     Effort: Pulmonary effort is normal.     Breath sounds: Normal breath sounds.  Abdominal:     General: There  is no distension.     Palpations: There is no mass.     Tenderness: There is no abdominal tenderness. There is no right CVA tenderness, left CVA tenderness, guarding or rebound.     Hernia: No hernia is present.     Comments: Abdomen soft, non tender. No guarding or rigidity. BS +VE A4Q   Musculoskeletal:        General: Tenderness (Mild line LS tenderness. Mild Rt paraspinous muscle spasm. No visible swelling, bruising or deformity. SLR test positive on right. Slight decrease ROM with flextion. Strength equal BL lower exremities. NV Intact. Gait steady.) present. No swelling, deformity or signs of injury.     Lumbar back: Spasms, tenderness and bony tenderness present. No swelling, edema, deformity or signs of trauma. Decreased range of motion. Positive left straight leg raise test.       Back:  Skin:    General: Skin is warm.  Neurological:     Mental Status: She is alert.  Psychiatric:        Mood and Affect: Mood normal.        Behavior: Behavior normal.      Vitals:   09/22/22 1002  BP: 126/78  Pulse: 76  SpO2: 99%  Weight: 248 lb 11.2 oz (112.8 kg)    Assessment & Plan:   See Encounters Tab for problem based charting.   Patient seen with Dr. Philipp Ovens

## 2022-09-23 ENCOUNTER — Other Ambulatory Visit: Payer: Self-pay | Admitting: Family Medicine

## 2022-09-23 DIAGNOSIS — E559 Vitamin D deficiency, unspecified: Secondary | ICD-10-CM

## 2022-09-23 LAB — VITAMIN D 25 HYDROXY (VIT D DEFICIENCY, FRACTURES): Vit D, 25-Hydroxy: 10.8 ng/mL — ABNORMAL LOW (ref 30.0–100.0)

## 2022-09-23 LAB — HEMOGLOBIN A1C
Est. average glucose Bld gHb Est-mCnc: 117 mg/dL
Hgb A1c MFr Bld: 5.7 % — ABNORMAL HIGH (ref 4.8–5.6)

## 2022-09-23 MED ORDER — VITAMIN D (ERGOCALCIFEROL) 1.25 MG (50000 UNIT) PO CAPS: 50000.0000 [IU] | ORAL_CAPSULE | ORAL | 0 refills | Status: AC

## 2022-09-23 NOTE — Progress Notes (Signed)
Prescription for vitamin D was sent to the pharmacy on file

## 2022-10-04 NOTE — Progress Notes (Addendum)
Referring Physician:  Paulette Blanch, MD [address] [place],  [place] [address]  Primary Physician:  Teola Bradley, MD  History of Present Illness: 10/06/22 [name] was seen in ED on 09/09/22 for right upper quadrant pain. CT of abdomen and pelvis 09/09/22 showed left paracentral herniated disc at L4-5 with mass effect on the descending left L5 nerve root and moderate spinal canal stenosis.  She has more constant right sided LBP with right posterior leg pain sometimes to her knee and sometimes to her foot x years that has been worse in last few months. She has intermittent tingling in right leg as well. She notes some feelings of weakness in both legs. Hard to get up after prolonged sitting due to pain. Feels like she has no strength. Some relief with naproxen. Was given cymbalta and it made her sick. 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   Conservative measures:  Physical therapy: no  Multimodal medical therapy including regular antiinflammatories: tylenol, motrin, naproxen.  Injections: no epidural steroid injections  Past Surgery: no spinal surgery  Catherine [name] has no symptoms of cervical myelopathy.  The symptoms are causing a significant impact on the patient's life.   Review of Systems:  A 10 point review of systems is negative, except for the pertinent positives and negatives detailed in the HPI.  Past Medical History: Past Medical History:  Diagnosis Date   Allergy    Asthma    past hx   Family history of brain aneurysm- father died around age 6  04/26/20   High risk heterosexual behavior 04-26-2020   History of anemia 2020/04/26   Migraine    Pelvic pain Apr 26, 2020   Right lower quadrant pain 26-Apr-2020    Past Surgical History: Past Surgical History:  Procedure Laterality Date   TONSILLECTOMY Bilateral 08/11/2015   Procedure: CONTROL POST TONSILLECTOMY BLEEDING ;  Surgeon: Margaretha Sheffield, MD;  Location: [facility];  Service: ENT;  Laterality: Bilateral;    TONSILLECTOMY AND ADENOIDECTOMY N/A 07/31/2015   Procedure: TONSILLECTOMY AND ADENOIDECTOMY;  Surgeon: Beverly Gust, MD;  Location: [facility];  Service: ENT;  Laterality: N/A;    Allergies: Allergies as of 10/06/2022 - Review Complete 10/06/2022  Allergen Reaction Noted   Amoxicillin Hives 06/05/2020    Medications: Outpatient Encounter Medications as of 10/06/2022  Medication Sig   dicyclomine (BENTYL) 20 MG tablet Take 1 tablet (20 mg total) by mouth every 6 (six) hours. (Patient not taking: Reported on 10/06/2022)   hydrOXYzine (ATARAX) 25 MG tablet Take 1 tablet (25 mg total) by mouth 3 (three) times daily as needed.   ipratropium (ATROVENT) 0.06 % nasal spray Place 2 sprays into both nostrils 4 (four) times daily.   meclizine (ANTIVERT) 12.5 MG tablet Take 1 tablet (12.5 mg total) by mouth 3 (three) times daily as needed for dizziness.   Multiple Vitamins-Minerals (MULTIVITAMIN WITH MINERALS) tablet Take 1 tablet by mouth daily.   ondansetron (ZOFRAN-ODT) 4 MG disintegrating tablet Take 1 tablet (4 mg total) by mouth every 8 (eight) hours as needed for nausea or vomiting.   Vitamin D, Ergocalciferol, (DRISDOL) 1.25 MG (50000 UNIT) CAPS capsule Take 1 capsule (50,000 Units total) by mouth every 7 (seven) days.   [DISCONTINUED] DULoxetine (CYMBALTA) 30 MG capsule Take 1 capsule (30 mg total) by mouth daily.   [DISCONTINUED] Rimegepant Sulfate (NURTEC) 75 MG TBDP Take 75 mg by mouth daily as needed for up to 1 dose (For migraines. Take when migraine begins. Use no  more than once per day).   No facility-administered encounter medications on file as of 10/06/2022.    Social History: Social History   Tobacco Use   Smoking status: Never    Passive exposure: Never   Smokeless tobacco: Never  Vaping Use   Vaping Use: Never used  Substance Use Topics   Alcohol use: Yes    Comment: once per month, 1-2 drinks   Drug use: Yes    Types: Marijuana    Family Medical  History: Family History  Problem Relation Age of Onset   Hypertension Father    Aneurysm Father    Hypertension Brother    Diabetes Brother    Breast cancer Maternal Grandmother    Cancer Maternal Grandmother    Lung disease Maternal Grandmother    Bipolar disorder Mother    Depression Mother    Cancer Mother    Schizophrenia Mother    Aneurysm Paternal Grandfather    Heart attack Maternal Grandfather     Physical Examination: Vitals:   10/06/22 0904  BP: 128/72    General: Patient is well developed, well nourished, calm, collected, and in no apparent distress. Attention to examination is appropriate.  Respiratory: Patient is breathing without any difficulty.   NEUROLOGICAL:     Awake, alert, oriented to person, place, and time.  Speech is clear and fluent. Fund of knowledge is appropriate.   Cranial Nerves: Pupils equal round and reactive to light.  Facial tone is symmetric.  Facial sensation is symmetric.  ROM of spine:  Limited ROM of lumbar spine with pain  No abnormal lesions on exposed skin.   Mild right sided lower lumbar tenderness.   Strength: Side Biceps Triceps Deltoid Interossei Grip Wrist Ext. Wrist Flex.  R 5 5 5 5 5 5 5   L 5 5 5 5 5 5 5    Side Iliopsoas Quads Hamstring PF DF EHL  R 5 5 5 5 5 5   L 5 5 5 5 5 5    Reflexes are 2+ and symmetric at the biceps, triceps, brachioradialis, patella and achilles.   Hoffman's is absent.  Clonus is not present.   Bilateral upper and lower extremity sensation is intact to light touch, but is diminished in right lower extremity.   Gait is normal.    Medical Decision Making  Imaging: No lumbar specific imaging available.  CT of abdomen and pelvis on 09/09/22 showed L4-5 left paracentral herniated disc with mass effect on the left L5 nerve root.  Assessment and Plan: Catherine [name] is a pleasant 21 y.o. female with more constant right sided LBP with right posterior leg pain sometimes to her knee and sometimes to  her foot x years that has been worse in last few months.   No lumbar specific imaging available. CT of abdomen and pelvis on 09/09/22 showed L4-5 left paracentral herniated disc with mass effect on the left L5 nerve root.  Treatment options discussed with patient and following plan made:   - MRI of lumbar spine to further evaluate right LE radiculopathy and findings on CT of abdomen/pelvis.  - Discussed NSAIDs. She is still having GI issues so will hold off. Can do OTC tylenol as directed on the bottle.  - She is scheduled to start PT for lumbar spine today (ordered by PCP). I agree with this.  - Will see her back in clinic to review MRI after it has been completed.  - Of note, she is having nausea and dizziness with cymbalta, she  will contact prescribing provider to discuss.   I spent a total of 30 minutes in face-to-face and non-face-to-face activities related to this patient's care toda including review of outside records, review of imaging, review of symptoms, physical exam, discussion of differential diagnosis, discussion of treatment options, and documentation.   Thank you for involving me in the care of this patient.   Geronimo Boot PA-C Dept. of Neurosurgery

## 2022-10-05 NOTE — Therapy (Signed)
OUTPATIENT PHYSICAL THERAPY THORACOLUMBAR EVALUATION   Patient Name: Catherine [name] MRN: [id number] DOB:Oct 21, 2001, 21 y.o., female Today's Date: 10/06/2022   PT End of Session - 10/06/22 1211     Visit Number 1    Number of Visits 17    Date for PT Re-Evaluation 12/08/22    Authorization Type [facility] [place] Complete    Authorization Time Period Pending auth    PT Start Time 1155    PT Stop Time 1240    PT Time Calculation (min) 45 min    Activity Tolerance Patient tolerated treatment well    Behavior During Therapy [facility] for tasks assessed/performed             Past Medical History:  Diagnosis Date   Allergy    Asthma    past hx   Family history of brain aneurysm- father died around age 62  04/17/2020   High risk heterosexual behavior 04-17-20   History of anemia 04/17/20   Migraine    Pelvic pain 04-17-2020   Right lower quadrant pain 17-Apr-2020   Past Surgical History:  Procedure Laterality Date   TONSILLECTOMY Bilateral 08/11/2015   Procedure: CONTROL POST TONSILLECTOMY BLEEDING ;  Surgeon: Vernie Murders, MD;  Location: [facility];  Service: ENT;  Laterality: Bilateral;   TONSILLECTOMY AND ADENOIDECTOMY N/A 07/31/2015   Procedure: TONSILLECTOMY AND ADENOIDECTOMY;  Surgeon: Linus Salmons, MD;  Location: [facility];  Service: ENT;  Laterality: N/A;   Patient Active Problem List   Diagnosis Date Noted   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   GERD (gastroesophageal reflux disease) 09/22/2022   Anxiety and depression 06/09/2022   Vitamin D insufficiency 17-Apr-2020   Menorrhagia with irregular cycle 2020/04/17   Body mass index (BMI) of 40.1-44.9 in adult (HCC) Apr 17, 2020   Polyuria Apr 17, 2020   Migraine with aura and without status migrainosus, not intractable 04-17-2020   Acute non intractable tension-type headache 08/14/2018   Allergic rhinitis 04/01/2010   Childhood asthma, mild intermittent, uncomplicated 02/01/2007    PCP: Reinaldo Raddle, MD  REFERRING PROVIDER: Tyson Alias, MD  REFERRING DIAG:  [phone] (ICD-10-CM) - Low back pain due to bilateral sciatica  M51.26 (ICD-10-CM) - Herniated lumbar intervertebral disc    Rationale for Evaluation and Treatment: Rehabilitation  THERAPY DIAG:  Chronic bilateral low back pain with bilateral sciatica - Plan: PT plan of care cert/re-cert  Muscle weakness (generalized) - Plan: PT plan of care cert/re-cert  ONSET DATE: 2-3 years ago  SUBJECTIVE:  SUBJECTIVE STATEMENT: Pt reports primary c/o chronic LBP with associated BIL Rt>Lt N/T and pain of insidious onset lasting about 2-3 years.  The N/T and pain can radiate down to the toes, although it is usually in the thighs. Pt denies any hip popping/ clicking. Her radicular symptoms are correlated with increased LBP. Recent CT scan of her lumbar spine indicates an L4-5 left paracentral herniated disc with mass effect on the left L5 nerve root. Current pain is 6.5/10. Worst pain is 10/10. Best pain is 3/10. Aggravating factors include sitting >10-15 minutes, standing from a supine position, driving. Easing factors include IcyHot, hot pack, Naproxen. Pt reports a 14-pound weight loss a couple of weeks ago, which may be attributable to decreased appetite and increased movement at work. Pt works at [employer]. Pt is on her feet most of the day. Pt also reports increased urge to urinate over the past month since her symptoms began, but she denies any incontinence or bowel changes. Pt denies any saddle anesthesia, nausea/ vomiting, or unrelenting night pain, although the pt does endorse increased pain at night which is improved with positional change.  PERTINENT HISTORY:  N/A  PAIN:  Are you having pain? Yes: NPRS scale: 6.5/10 Pain  location: Rt low back Pain description: Deep, constant Aggravating factors: sitting >10-15 minutes, standing from a supine position, driving Relieving factors: IcyHot, hot pack, Naproxen  PRECAUTIONS: None  WEIGHT BEARING RESTRICTIONS: No  FALLS:  Has patient fallen in last 6 months? 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  PLOF: Independent  PATIENT GOALS: Improve lumbar mobility, ADLs, eliminate pain   OBJECTIVE:   DIAGNOSTIC FINDINGS:  09/09/2022: CT Abdomen Pelvis W Contrast: IMPRESSION: 1. Mild features of bronchitis with no acute chest CT or CTA findings. 2. No acute abdominal or pelvic process identified. 3. Slightly prominent and steatotic liver. 4. Constipation and diverticulosis. 5. Gastroenteritis without evidence of bowel obstruction or inflammation. 6. L4-5 left paracentral herniated disc with mass effect on the left L5 nerve root. 7. Remaining findings described above.  PATIENT SURVEYS:  Modified Oswestry 13/50, 26%   SCREENING FOR RED FLAGS: Bowel or bladder incontinence: No Cauda equina syndrome: No  COGNITION: Overall cognitive status: Within functional limits for tasks assessed     SENSATION: Light touch: Rt L2-S2 dermatomes diminished vs. Lt  Reflexes: WNL patellar and Achilles BIL  MUSCLE LENGTH: Hamstrings: WNL BIL Thomas test: WNL  POSTURE: decreased lumbar lordosis  PALPATION: TTP to Rt lumbar paraspinals/ QL  PASSIVE ACCESSORIES: CPAs hypomobile and painful L3-S1  LUMBAR ROM:   AROM eval  Flexion WNL, p!  Extension WNL  Right lateral flexion WNL  Left lateral flexion WNL  Right rotation WNL, p!  Left rotation WNL   (Blank rows = not tested)    LOWER EXTREMITY MMT:    MMT Right eval Left eval  Hip flexion 4+/5 4+/5  Hip extension 4+/5 4+/5  Hip  abduction 4/5 5/5  Hip internal rotation 4+/5 4+/5  Hip external rotation 5/5 5/5  Knee flexion 5/5 5/5  Knee extension 5/5 5/5  Ankle dorsiflexion 5/5 5/5  Ankle plantarflexion 5/5 5/5   (Blank rows = not tested)  LUMBAR SPECIAL TESTS:  Slump: (+) on Rt SLR: (+) on Rt Repeated trunk extension 2x10: (+) for centralized symptoms  FUNCTIONAL TESTS:  5xSTS: 16 seconds Squat: WNL, minor increase in  pain Forearm plank: 3 seconds   TODAY'S TREATMENT:                                                                                                                               [facility] Adult PT Treatment:                                                DATE: 10/05/2022 Therapeutic Exercise: Demonstrated and issued HEP with instructions on how to access on MedBridge Pt educated on probable underlying pathophysiology, POC, prognosis, and ODI Manual Therapy: N/A Neuromuscular re-ed: N/A Therapeutic Activity: N/A Modalities: N/A Self Care: N/A    PATIENT EDUCATION:  Education details: Pt educated on probable underlying pathophysiology, POC, prognosis, ODI, and HEP Person educated: Patient Education method: Explanation, Demonstration, and Handouts Education comprehension: verbalized understanding and returned demonstration  HOME EXERCISE PROGRAM: Access Code: [email or web address] URL: [email or web address] Date: 10/06/2022 Prepared by: Carmelina Dane  Exercises - Sidelying Open Book Thoracic Lumbar Rotation and Extension  - 1 x daily - 7 x weekly - 2 sets - 10 reps - Standing Lumbar Extension  - 1 x daily - 7 x weekly - 3 sets - 20 reps - Seated Slump Nerve Glide  - 1 x daily - 7 x weekly - 3 sets - 20 reps - Supine 90/90 Abdominal Bracing *with handhold resistance to thighs*  - 1 x daily - 7 x weekly - 3 sets - 30 seconds hold - Side Plank on Knees  - 1 x daily - 7 x weekly - 3 sets - 30 seconds hold  ASSESSMENT:  CLINICAL IMPRESSION: Patient is a 21 y.o. F who was seen  today for physical therapy evaluation and treatment for chronic Rt>Lt LBP with Rt>Lt LE radicular pain and N/T.  Upon assessment, her primary impairments include painful trunk flexion and Rt rotation AROM, hypomobile and painful lumbosacral passive accessory mobility, TTP to Rt lumbar paraspinals and QL, mild weakness in BIL global hip MMT, weak functional core strength, and painful squat. Ruling up Rt lumbar radiculopathy due to positive slump and SLR special testing, pain with sitting/ trunk flexion, and recent CT to suggest paracentral disc bulge. Pt will benefit from skilled PT to address her primary impairments and return to her prior level of function with less limitation.   OBJECTIVE IMPAIRMENTS: decreased endurance, decreased mobility, decreased ROM, decreased strength, hypomobility, impaired flexibility, impaired sensation, improper body mechanics, postural dysfunction, and pain.   ACTIVITY LIMITATIONS: carrying, lifting, bending, sitting, squatting, sleeping, and transfers  PARTICIPATION LIMITATIONS: meal prep, cleaning, laundry, driving, shopping, community activity, and occupation  PERSONAL FACTORS: Time since onset of injury/illness/exacerbation are also affecting patient's functional outcome.   REHAB POTENTIAL: Good  CLINICAL DECISION MAKING: Stable/uncomplicated  EVALUATION COMPLEXITY: Low   GOALS: Goals reviewed with patient? Yes  SHORT TERM GOALS:  Target date: 11/03/2022  Pt will report understanding and adherence to initial HEP in order to promote independence in the management of primary impairments. Baseline: HEP provided at eval Goal status: INITIAL     LONG TERM GOALS: Target date: 12/01/2022  Pt will achieve an ODI score of 14% or lower in order to demonstrate improved functional ability as it relates to the pt's primary impairments. Baseline: 26% Goal status: INITIAL  2.  Pt will report ability to sit >30 minutes with 0-4/10 pain in order to go on car trips  with less limitation. Baseline: >6/10 pain with 10-15 minutes of sitting Goal status: INITIAL  3.  Pt will achieve a forearm plank of 30 seconds or longer in order to promote core strength in the prophylaxis of future mechanical LBP. Baseline: 3 seconds Goal status: INITIAL  4.  Pt will achieve WNL global lumbar AROM with 0-4/10 pain in order to get dressed with less limitation. Baseline: See AROM chart, painful motions >7/10 pain Goal status: INITIAL  5.  Pt will achieve a 5xSTS in 12 seconds or less in order to demonstrate safe and efficient community-level transfers. Baseline: 16 seconds Goal status: INITIAL   PLAN:  PT FREQUENCY: 1x/week  PT DURATION: 8 weeks  PLANNED INTERVENTIONS: Therapeutic exercises, Therapeutic activity, Neuromuscular re-education, Balance training, Gait training, Patient/Family education, Self Care, Joint mobilization, Joint manipulation, Aquatic Therapy, Dry Needling, Electrical stimulation, Spinal manipulation, Spinal mobilization, Cryotherapy, Moist heat, Taping, Traction, Biofeedback, Ionotophoresis 4mg /ml Dexamethasone, Manual therapy, and Re-evaluation.  PLAN FOR NEXT SESSION: Progress core/ hip strengthening, lumbar mobility, manual techniques to include TPDN and OMT PRN   Vanessa [place], PT, DPT 10/06/22 12:49 PM

## 2022-10-06 ENCOUNTER — Encounter: Payer: Self-pay | Admitting: Orthopedic Surgery

## 2022-10-06 ENCOUNTER — Other Ambulatory Visit: Payer: Self-pay

## 2022-10-06 ENCOUNTER — Ambulatory Visit: Payer: Medicaid Other | Attending: Student in an Organized Health Care Education/Training Program

## 2022-10-06 ENCOUNTER — Ambulatory Visit (INDEPENDENT_AMBULATORY_CARE_PROVIDER_SITE_OTHER): Payer: Medicaid Other | Admitting: Orthopedic Surgery

## 2022-10-06 ENCOUNTER — Telehealth: Payer: Self-pay | Admitting: *Deleted

## 2022-10-06 ENCOUNTER — Telehealth: Payer: Self-pay | Admitting: Family Medicine

## 2022-10-06 VITALS — BP 128/72 | Ht 63.0 in | Wt 241.0 lb

## 2022-10-06 DIAGNOSIS — M5441 Lumbago with sciatica, right side: Secondary | ICD-10-CM | POA: Diagnosis present

## 2022-10-06 DIAGNOSIS — M5126 Other intervertebral disc displacement, lumbar region: Secondary | ICD-10-CM | POA: Insufficient documentation

## 2022-10-06 DIAGNOSIS — M5416 Radiculopathy, lumbar region: Secondary | ICD-10-CM | POA: Diagnosis not present

## 2022-10-06 DIAGNOSIS — M6281 Muscle weakness (generalized): Secondary | ICD-10-CM | POA: Insufficient documentation

## 2022-10-06 DIAGNOSIS — M5442 Lumbago with sciatica, left side: Secondary | ICD-10-CM | POA: Insufficient documentation

## 2022-10-06 DIAGNOSIS — G8929 Other chronic pain: Secondary | ICD-10-CM | POA: Insufficient documentation

## 2022-10-06 NOTE — Telephone Encounter (Signed)
Pt states the following medication is making her feel sick.  The medication makes her stomach feel in knots and feels very dizzy.  DULoxetine HCl 30 mg Oral Daily

## 2022-10-06 NOTE — Telephone Encounter (Signed)
Call from patient stated that the Cymbalta makes her dizzy and she has nausea.  Stated was told to stop the Lexapro. This she said worked and did not have the side effects that she has with the Cymbalta.  Wants to know what she can take now.  [phone].

## 2022-10-08 ENCOUNTER — Other Ambulatory Visit: Payer: Self-pay | Admitting: Family Medicine

## 2022-10-08 DIAGNOSIS — F320 Major depressive disorder, single episode, mild: Secondary | ICD-10-CM

## 2022-10-08 MED ORDER — ESCITALOPRAM OXALATE 10 MG PO TABS: 10.0000 mg | ORAL_TABLET | Freq: Every day | ORAL | 1 refills | Status: DC

## 2022-10-08 NOTE — Progress Notes (Signed)
Lexapro was sent to the pharmacy on file. Advised to stop Cymbalta.

## 2022-10-10 NOTE — Telephone Encounter (Signed)
RTC to patient message left to stop the Cymbalta and that a new prescription has been sent to her Pharmacy on file here.  Patient was asked to call of she has further questions.

## 2022-10-13 ENCOUNTER — Ambulatory Visit: Payer: Medicaid Other

## 2022-10-13 DIAGNOSIS — M6281 Muscle weakness (generalized): Secondary | ICD-10-CM

## 2022-10-13 DIAGNOSIS — M5442 Lumbago with sciatica, left side: Secondary | ICD-10-CM | POA: Diagnosis not present

## 2022-10-13 DIAGNOSIS — G8929 Other chronic pain: Secondary | ICD-10-CM

## 2022-10-13 NOTE — Therapy (Signed)
OUTPATIENT PHYSICAL THERAPY TREATMENT NOTE   Patient Name: Catherine [name] MRN: [id number] DOB:[birth year], 21 y.o., female Today's Date: 10/13/2022  PCP: Reinaldo Raddle, MD  REFERRING PROVIDER: Tyson Alias, MD   END OF SESSION:   PT End of Session - 10/13/22 1001     Visit Number 2    Number of Visits 17    Date for PT Re-Evaluation 12/08/22    Authorization Type [employer] Complete    Authorization Time Period 10/07/2022-11/19/2022    Authorization - Visit Number 1    Authorization - Number of Visits 12    PT Start Time 1001    PT Stop Time 1040    PT Time Calculation (min) 39 min    Activity Tolerance Patient tolerated treatment well    Behavior During Therapy WFL for tasks assessed/performed             Past Medical History:  Diagnosis Date   Allergy    Asthma    past hx   Family history of brain aneurysm- father died around age 79  05/02/20   High risk heterosexual behavior 05/02/2020   History of anemia 02-May-2020   Migraine    Pelvic pain 05-02-2020   Right lower quadrant pain May 02, 2020   Past Surgical History:  Procedure Laterality Date   TONSILLECTOMY Bilateral 08/11/2015   Procedure: CONTROL POST TONSILLECTOMY BLEEDING ;  Surgeon: Vernie Murders, MD;  Location: [facility];  Service: ENT;  Laterality: Bilateral;   TONSILLECTOMY AND ADENOIDECTOMY N/A 07/31/2015   Procedure: TONSILLECTOMY AND ADENOIDECTOMY;  Surgeon: Linus Salmons, MD;  Location: [facility];  Service: ENT;  Laterality: N/A;   Patient Active Problem List   Diagnosis Date Noted   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   GERD (gastroesophageal reflux disease) 09/22/2022   Anxiety and depression 06/09/2022   Vitamin D insufficiency 2020/05/02   Menorrhagia with irregular cycle 05-02-2020   Body mass index (BMI) of 40.1-44.9 in adult [facility]) 05-02-20   Polyuria May 02, 2020   Migraine with aura and without status migrainosus, not intractable 02-May-2020   Acute  non intractable tension-type headache 08/14/2018   Allergic rhinitis 04/01/2010   Childhood asthma, mild intermittent, uncomplicated 02/01/2007    REFERRING DIAG:  M54.41,M54.42 (ICD-10-CM) - Low back pain due to bilateral sciatica  M51.26 (ICD-10-CM) - Herniated lumbar intervertebral disc    THERAPY DIAG:  Chronic bilateral low back pain with bilateral sciatica  Muscle weakness (generalized)  Rationale for Evaluation and Treatment Rehabilitation   SUBJECTIVE:  SUBJECTIVE STATEMENT:  Pt reports 5/10 LBP currently with no LE symptoms. She reports her HEP has helped with her pain overall.    PAIN:  Are you having pain? Yes: NPRS scale: 5/10 Pain location: Rt low back Pain description: Deep, constant Aggravating factors: sitting >10-15 minutes, standing from a supine position, driving Relieving factors: IcyHot, hot pack, Naproxen   OBJECTIVE: (objective measures completed at initial evaluation unless otherwise dated)   DIAGNOSTIC FINDINGS:  09/09/2022: CT Abdomen Pelvis W Contrast: IMPRESSION: 1. Mild features of bronchitis with no acute chest CT or CTA findings. 2. No acute abdominal or pelvic process identified. 3. Slightly prominent and steatotic liver. 4. Constipation and diverticulosis. 5. Gastroenteritis without evidence of bowel obstruction or inflammation. 6. L4-5 left paracentral herniated disc with mass effect on the left L5 nerve root. 7. Remaining findings described above.   PATIENT SURVEYS:  Modified Oswestry 13/50, 26%    SCREENING FOR RED FLAGS: Bowel or bladder incontinence: No Cauda equina syndrome: No   COGNITION: Overall cognitive status: Within functional limits for tasks assessed                          SENSATION: Light touch: Rt L2-S2 dermatomes diminished vs.  Lt   Reflexes: WNL patellar and Achilles BIL   MUSCLE LENGTH: Hamstrings: WNL BIL Thomas test: WNL   POSTURE: decreased lumbar lordosis   PALPATION: TTP to Rt lumbar paraspinals/ QL   PASSIVE ACCESSORIES: CPAs hypomobile and painful L3-S1   LUMBAR ROM:    AROM eval  Flexion WNL, p!  Extension WNL  Right lateral flexion WNL  Left lateral flexion WNL  Right rotation WNL, p!  Left rotation WNL   (Blank rows = not tested)       LOWER EXTREMITY MMT:     MMT Right eval Left eval  Hip flexion 4+/5 4+/5  Hip extension 4+/5 4+/5  Hip abduction 4/5 5/5  Hip internal rotation 4+/5 4+/5  Hip external rotation 5/5 5/5  Knee flexion 5/5 5/5  Knee extension 5/5 5/5  Ankle dorsiflexion 5/5 5/5  Ankle plantarflexion 5/5 5/5   (Blank rows = not tested)   LUMBAR SPECIAL TESTS:  Slump: (+) on Rt SLR: (+) on Rt Repeated trunk extension 2x10: (+) for centralized symptoms   FUNCTIONAL TESTS:  5xSTS: 16 seconds Squat: WNL, minor increase in pain Forearm plank: 3 seconds     TODAY'S TREATMENT:                                                           [facility] Adult PT Treatment:                                                DATE: 10/13/2022 Therapeutic Exercise: Supine bicycles with handhold resistance through thighs 3x20 Bridge 3x10 with 3-sec hold Prone superman with lat pulls 3x10 Side knee plank with hip abduction 2x10 BIL Standing Pallof press with 10# cable 2x10 with 5-sec hold BIL Standing alternating golfer's stretch (trunk flexion into trunk rotation/extension) 2x10 BIL 45# barbell deadlift 3x8 Manual Therapy: Sidelying lumbar roll grade V manipulation x1 BIL with cavitation Neuromuscular re-ed: N/A  Therapeutic Activity: N/A Modalities: N/A Self Care: N/A                                                                       [facility] Adult PT Treatment:                                                DATE: 10/05/2022 Therapeutic Exercise: Demonstrated and issued  HEP with instructions on how to access on [place] Pt educated on probable underlying pathophysiology, POC, prognosis, and ODI Manual Therapy: N/A Neuromuscular re-ed: N/A Therapeutic Activity: N/A Modalities: N/A Self Care: N/A       PATIENT EDUCATION:  Education details: Pt educated on probable underlying pathophysiology, POC, prognosis, ODI, and HEP Person educated: Patient Education method: Explanation, Demonstration, and Handouts Education comprehension: verbalized understanding and returned demonstration   HOME EXERCISE PROGRAM: Access Code: [email or web address] URL: [email or web address] Date: 10/06/2022 Prepared by: Vanessa [place]   Exercises - Sidelying Open Book Thoracic Lumbar Rotation and Extension  - 1 x daily - 7 x weekly - 2 sets - 10 reps - Standing Lumbar Extension  - 1 x daily - 7 x weekly - 3 sets - 20 reps - Seated Slump Nerve Glide  - 1 x daily - 7 x weekly - 3 sets - 20 reps - Supine 90/90 Abdominal Bracing *with handhold resistance to thighs*  - 1 x daily - 7 x weekly - 3 sets - 30 seconds hold - Side Plank on Knees  - 1 x daily - 7 x weekly - 3 sets - 30 seconds hold   ASSESSMENT:   CLINICAL IMPRESSION: Pt responded excellently to all interventions today, demonstrating good form and no pain with selected exercises. She reports a decrease in pain from 5/10 to 3/10 following OMT today. Pt will continue to benefit from skilled PT to address her primary impairments and return to her prior level of function with less limitation.    OBJECTIVE IMPAIRMENTS: decreased endurance, decreased mobility, decreased ROM, decreased strength, hypomobility, impaired flexibility, impaired sensation, improper body mechanics, postural dysfunction, and pain.    ACTIVITY LIMITATIONS: carrying, lifting, bending, sitting, squatting, sleeping, and transfers   PARTICIPATION LIMITATIONS: meal prep, cleaning, laundry, driving, shopping, community activity, and occupation    PERSONAL FACTORS: Time since onset of injury/illness/exacerbation are also affecting patient's functional outcome.       GOALS: Goals reviewed with patient? Yes   SHORT TERM GOALS: Target date: 11/03/2022   Pt will report understanding and adherence to initial HEP in order to promote independence in the management of primary impairments. Baseline: HEP provided at eval Goal status: INITIAL         LONG TERM GOALS: Target date: 12/01/2022   Pt will achieve an ODI score of 14% or lower in order to demonstrate improved functional ability as it relates to the pt's primary impairments. Baseline: 26% Goal status: INITIAL   2.  Pt will report ability to sit >30 minutes with 0-4/10 pain in order to go on car trips with less limitation. Baseline: >6/10 pain with 10-15 minutes of sitting Goal status: INITIAL   3.  Pt will achieve a forearm plank of 30 seconds or longer in order to promote core strength in the prophylaxis of future mechanical LBP. Baseline: 3 seconds Goal status: INITIAL   4.  Pt will achieve WNL global lumbar AROM with 0-4/10 pain in order to get dressed with less limitation. Baseline: See AROM chart, painful motions >7/10 pain Goal status: INITIAL   5.  Pt will achieve a 5xSTS in 12 seconds or less in order to demonstrate safe and efficient community-level transfers. Baseline: 16 seconds Goal status: INITIAL     PLAN:   PT FREQUENCY: 1x/week   PT DURATION: 8 weeks   PLANNED INTERVENTIONS: Therapeutic exercises, Therapeutic activity, Neuromuscular re-education, Balance training, Gait training, Patient/Family education, Self Care, Joint mobilization, Joint manipulation, Aquatic Therapy, Dry Needling, Electrical stimulation, Spinal manipulation, Spinal mobilization, Cryotherapy, Moist heat, Taping, Traction, Biofeedback, Ionotophoresis 4mg /ml Dexamethasone, Manual therapy, and Re-evaluation.   PLAN FOR NEXT SESSION: Progress core/ hip strengthening, lumbar  mobility, manual techniques to include TPDN and OMT PRN   Vanessa [place], PT, DPT 10/13/22 10:42 AM

## 2022-10-19 ENCOUNTER — Ambulatory Visit (INDEPENDENT_AMBULATORY_CARE_PROVIDER_SITE_OTHER): Payer: Medicaid Other | Admitting: Student

## 2022-10-19 VITALS — BP 136/80 | HR 74 | Temp 98.7°F | Ht 63.0 in | Wt 247.3 lb

## 2022-10-19 DIAGNOSIS — M5116 Intervertebral disc disorders with radiculopathy, lumbar region: Secondary | ICD-10-CM | POA: Diagnosis not present

## 2022-10-19 DIAGNOSIS — R21 Rash and other nonspecific skin eruption: Secondary | ICD-10-CM

## 2022-10-19 DIAGNOSIS — E559 Vitamin D deficiency, unspecified: Secondary | ICD-10-CM | POA: Diagnosis not present

## 2022-10-19 HISTORY — DX: Rash and other nonspecific skin eruption: R21

## 2022-10-19 MED ORDER — TRIAMCINOLONE ACETONIDE 0.5 % EX OINT: 1.0000 | TOPICAL_OINTMENT | Freq: Two times a day (BID) | CUTANEOUS | 1 refills | Status: DC

## 2022-10-19 MED ORDER — VITAMIN D 50 MCG (2000 UT) PO TABS: 2000.0000 [IU] | ORAL_TABLET | Freq: Every day | ORAL | 1 refills | Status: DC

## 2022-10-19 NOTE — Assessment & Plan Note (Signed)
Patient reports development of a rash 2 days ago. She has three areas where this rash occurred, including her left lower medial leg, right upper lateral arm, and above her sternum. The rash has not been painful and she has not noted any discharge. It is itchy in each of these areas. She has been using hydrocortisone 1% cream over these areas to help with itching sensation. She reports that the rash on her arm and sternum are improving and have decreased in size. However, the rash on her leg seems to be unchanged. She denies any fevers, chills, or other systemic symptoms. She does not go outdoors and has not used any new skin products. She did go to her grandmother's house but reports that she has gone there before without developing skin changes.   On exam, all three areas of rash (left lower medial leg above medial malleolus, R upper lateral arm, above sternum) are blanching. No vesicular lesions noted. The rash on LLE is about the size of a baseball and does have increased warmth. It does appear that she has been scratching the area although no obvious skin damage noted.   Given blanching rash, low suspicion for vasculitis. She does not have a history of eczema, psoriasis, or any autoimmune conditions. She is more predisposed to allergic skin rash given history of concomitant asthma (since childhood) and allergic rhinitis. This is more likely a contact/irritant dermatitis that likely will improve with topical steroid therapy, given that she already has had improvement in other two areas with low dose topical hydrocortisone cream. Will prescribe 0.5% kenalog ointment to help with alleviation of symptoms. We discussed return precautions if her rash worsens/spreads or if she develops any systemic symptoms (fever, chills, etc.). She will use a marker at home to note current size and look for enlargement.  Plan: -kenalog ointment 0.5% BID

## 2022-10-19 NOTE — Patient Instructions (Signed)
Catherine [name],  It was a pleasure seeing you in the clinic today.   For your rash, I have sent a steroid cream (kenalog) to help with your symptoms. Please use this twice a day on the areas where you have a rash. I have prescribed vitamin D supplementation for you. Please take 1 tablet daily. Your MRI is scheduled for 10/21/2022 at 4PM at [facility]. Please come back to see [place] in 3 months (or sooner if your rash worsens).  Please call our clinic at [phone] if you have any questions or concerns. The best time to call is Monday-Friday from 9am-4pm, but there is someone available 24/7 at the same number. If you need medication refills, please notify your pharmacy one week in advance and they will send [place] a request.   Thank you for letting us take part in your care. We look forward to seeing you next time!

## 2022-10-19 NOTE — Assessment & Plan Note (Signed)
Scheduled to undergo MRI lumbar spine on 11/17 per surgery. She will follow with surgery after this is completed.

## 2022-10-19 NOTE — Progress Notes (Signed)
   CC: rash  HPI:  Catherine [name] is a 21 y.o. female with history listed below presenting to the [facility] for rash. Please see individualized problem based charting for full HPI.  Past Medical History:  Diagnosis Date   Allergy    Asthma    past hx   Family history of brain aneurysm- father died around age 5  04/28/20   High risk heterosexual behavior 04/28/2020   History of anemia 04/28/20   Migraine    Pelvic pain Apr 28, 2020   Right lower quadrant pain 04-28-2020    Review of Systems:  Negative aside from that listed in individualized problem based charting.  Physical Exam:  Vitals:   10/19/22 0925 10/19/22 0948  BP: (!) 152/92 136/80  Pulse: 80 74  Temp: 98.7 F (37.1 C)   TempSrc: Oral   SpO2: 99%   Weight: 247 lb 4.8 oz (112.2 kg)   Height: 5\' 3"  (1.6 m)    Physical Exam Constitutional:      Appearance: Normal appearance. She is obese. She is not ill-appearing.  HENT:     Nose: Nose normal.     Mouth/Throat:     Mouth: Mucous membranes are moist.     Pharynx: Oropharynx is clear.  Eyes:     Extraocular Movements: Extraocular movements intact.     Conjunctiva/sclera: Conjunctivae normal.     Pupils: Pupils are equal, round, and reactive to light.  Cardiovascular:     Rate and Rhythm: Normal rate and regular rhythm.     Pulses: Normal pulses.     Heart sounds: Normal heart sounds. No murmur heard.    No friction rub. No gallop.  Pulmonary:     Effort: Pulmonary effort is normal.     Breath sounds: Normal breath sounds. No wheezing, rhonchi or rales.  Abdominal:     General: Bowel sounds are normal. There is no distension.     Palpations: Abdomen is soft.     Tenderness: There is no abdominal tenderness.  Musculoskeletal:        General: Normal range of motion.  Skin:    General: Skin is warm and dry.     Comments: Blanching rash on LLE above medial malleolus. Rash is about the size of a baseball (circular) and is not TTP. No vesicular lesions or obvious  bite in the area. She also has two other areas with similar rash (albeit smaller in size) on R lateral upper arm and above the sternum.   Neurological:     General: No focal deficit present.     Mental Status: She is alert and oriented to person, place, and time.      Assessment & Plan:   See Encounters Tab for problem based charting.  Patient discussed with Dr. 

## 2022-10-19 NOTE — Assessment & Plan Note (Signed)
Vitamin D level of 10.8 on recent check. Will supplement with 2000UT daily. Plan for recheck in 3-4 months.

## 2022-10-20 ENCOUNTER — Encounter: Payer: Medicaid Other | Admitting: Student

## 2022-10-21 ENCOUNTER — Ambulatory Visit
Admission: RE | Admit: 2022-10-21 | Discharge: 2022-10-21 | Disposition: A | Discharge status: Home / Self Care | Payer: Medicaid Other | Source: Ambulatory Visit | Attending: Orthopedic Surgery | Admitting: Orthopedic Surgery

## 2022-10-21 DIAGNOSIS — M5126 Other intervertebral disc displacement, lumbar region: Secondary | ICD-10-CM | POA: Insufficient documentation

## 2022-10-21 DIAGNOSIS — M5416 Radiculopathy, lumbar region: Secondary | ICD-10-CM | POA: Insufficient documentation

## 2022-10-21 NOTE — Therapy (Signed)
OUTPATIENT PHYSICAL THERAPY TREATMENT NOTE   Patient Name: Catherine [name] MRN: [id number] DOB:2001-04-24, 21 y.o., female Today's Date: 10/22/2022  PCP: Reinaldo Raddle, MD  REFERRING PROVIDER: Tyson Alias, MD   END OF SESSION:   PT End of Session - 10/22/22 0805     Visit Number 3    Number of Visits 17    Date for PT Re-Evaluation 12/08/22    Authorization Type MCD [place] Complete    Authorization Time Period 10/07/2022-11/19/2022    Authorization - Visit Number 2    Authorization - Number of Visits 12    PT Start Time 0815    PT Stop Time 0855    PT Time Calculation (min) 40 min    Activity Tolerance Patient tolerated treatment well    Behavior During Therapy [facility] for tasks assessed/performed              Past Medical History:  Diagnosis Date   Allergy    Asthma    past hx   Family history of brain aneurysm- father died around age 46  May 02, 2020   High risk heterosexual behavior May 02, 2020   History of anemia 05-02-20   Migraine    Pelvic pain 2020-05-02   Right lower quadrant pain 05-02-2020   Past Surgical History:  Procedure Laterality Date   TONSILLECTOMY Bilateral 08/11/2015   Procedure: CONTROL POST TONSILLECTOMY BLEEDING ;  Surgeon: Vernie Murders, MD;  Location: [facility] ORS;  Service: ENT;  Laterality: Bilateral;   TONSILLECTOMY AND ADENOIDECTOMY N/A 07/31/2015   Procedure: TONSILLECTOMY AND ADENOIDECTOMY;  Surgeon: Linus Salmons, MD;  Location: [facility];  Service: ENT;  Laterality: N/A;   Patient Active Problem List   Diagnosis Date Noted   Blanching rash 10/19/2022   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   GERD (gastroesophageal reflux disease) 09/22/2022   Anxiety and depression 06/09/2022   Vitamin D insufficiency 05/02/2020   Menorrhagia with irregular cycle 05-02-2020   Body mass index (BMI) of 40.1-44.9 in adult [facility]) 02-May-2020   Polyuria 05/02/20   Migraine with aura and without status migrainosus, not  intractable 05/02/20   Acute non intractable tension-type headache 08/14/2018   Allergic rhinitis 04/01/2010   Childhood asthma, mild intermittent, uncomplicated 02/01/2007    REFERRING DIAG:  M54.41,M54.42 (ICD-10-CM) - Low back pain due to bilateral sciatica  M51.26 (ICD-10-CM) - Herniated lumbar intervertebral disc    THERAPY DIAG:  Chronic bilateral low back pain with bilateral sciatica  Muscle weakness (generalized)  Rationale for Evaluation and Treatment Rehabilitation   SUBJECTIVE:  SUBJECTIVE STATEMENT:  Patient reports that she got her wisdom teeth out a couple of days ago and that she doesn't have any back pain right now because she has so much pain in her mouth and face.    PAIN:  Are you having pain? Yes: NPRS scale: 0/10 Pain location: Rt low back Pain description: Deep, constant Aggravating factors: sitting >10-15 minutes, standing from a supine position, driving Relieving factors: IcyHot, hot pack, Naproxen   OBJECTIVE: (objective measures completed at initial evaluation unless otherwise dated)   DIAGNOSTIC FINDINGS:  09/09/2022: CT Abdomen Pelvis W Contrast: IMPRESSION: 1. Mild features of bronchitis with no acute chest CT or CTA findings. 2. No acute abdominal or pelvic process identified. 3. Slightly prominent and steatotic liver. 4. Constipation and diverticulosis. 5. Gastroenteritis without evidence of bowel obstruction or inflammation. 6. L4-5 left paracentral herniated disc with mass effect on the left L5 nerve root. 7. Remaining findings described above.   PATIENT SURVEYS:  Modified Oswestry 13/50, 26%    SCREENING FOR RED FLAGS: Bowel or bladder incontinence: No Cauda equina syndrome: No   COGNITION: Overall cognitive status: Within functional limits for tasks  assessed                          SENSATION: Light touch: Rt L2-S2 dermatomes diminished vs. Lt   Reflexes: WNL patellar and Achilles BIL   MUSCLE LENGTH: Hamstrings: WNL BIL Thomas test: WNL   POSTURE: decreased lumbar lordosis   PALPATION: TTP to Rt lumbar paraspinals/ QL   PASSIVE ACCESSORIES: CPAs hypomobile and painful L3-S1   LUMBAR ROM:    AROM eval  Flexion WNL, p!  Extension WNL  Right lateral flexion WNL  Left lateral flexion WNL  Right rotation WNL, p!  Left rotation WNL   (Blank rows = not tested)       LOWER EXTREMITY MMT:     MMT Right eval Left eval  Hip flexion 4+/5 4+/5  Hip extension 4+/5 4+/5  Hip abduction 4/5 5/5  Hip internal rotation 4+/5 4+/5  Hip external rotation 5/5 5/5  Knee flexion 5/5 5/5  Knee extension 5/5 5/5  Ankle dorsiflexion 5/5 5/5  Ankle plantarflexion 5/5 5/5   (Blank rows = not tested)   LUMBAR SPECIAL TESTS:  Slump: (+) on Rt SLR: (+) on Rt Repeated trunk extension 2x10: (+) for centralized symptoms   FUNCTIONAL TESTS:  5xSTS: 16 seconds Squat: WNL, minor increase in pain Forearm plank: 3 seconds     TODAY'S TREATMENT:      [facility] Adult PT Treatment:                                                DATE: 10/22/2022 Therapeutic Exercise: Supine bicycles with handhold resistance through thighs 3x20 Bridge 3x10 with 3-sec hold Side knee plank with hip abduction 2x10 BIL Standing Pallof press with 10# cable 2x10 with 5-sec hold BIL Cybex hip abduction/extension 25# 2x10 each BIL Cat/cow x15 Child's pose x30" Childs pose with side bend x30" BIL  [facility] Adult PT Treatment:                                                DATE: 10/13/2022 Therapeutic Exercise: Supine bicycles with handhold resistance through thighs 3x20 Bridge 3x10 with 3-sec hold Prone superman with lat pulls 3x10 Side knee plank with hip abduction 2x10 BIL Standing Pallof press with 10# cable  2x10 with 5-sec hold BIL Standing alternating golfer's stretch (trunk flexion into trunk rotation/extension) 2x10 BIL 45# barbell deadlift 3x8 Manual Therapy: Sidelying lumbar roll grade V manipulation x1 BIL with cavitation Neuromuscular re-ed: N/A Therapeutic Activity: N/A Modalities: N/A Self Care: N/A                                                                       [facility] Adult PT Treatment:                                                DATE: 10/05/2022 Therapeutic Exercise: Demonstrated and issued HEP with instructions on how to access on [employer] Pt educated on probable underlying pathophysiology, POC, prognosis, and ODI Manual Therapy: N/A Neuromuscular re-ed: N/A Therapeutic Activity: N/A Modalities: N/A Self Care: N/A       PATIENT EDUCATION:  Education details: Pt educated on probable underlying pathophysiology, POC, prognosis, ODI, and HEP Person educated: Patient Education method: Explanation, Demonstration, and Handouts Education comprehension: verbalized understanding and returned demonstration   HOME EXERCISE PROGRAM: Access Code: [email or web address] URL: [email or web address] Date: 10/06/2022 Prepared by: Carmelina Dane   Exercises - Sidelying Open Book Thoracic Lumbar Rotation and Extension  - 1 x daily - 7 x weekly - 2 sets - 10 reps - Standing Lumbar Extension  - 1 x daily - 7 x weekly - 3 sets - 20 reps - Seated Slump Nerve Glide  - 1 x daily - 7 x weekly - 3 sets - 20 reps - Supine 90/90 Abdominal Bracing *with handhold resistance to thighs*  - 1 x daily - 7 x weekly - 3 sets - 30 seconds hold - Side Plank on Knees  - 1 x daily - 7 x weekly - 3 sets - 30 seconds hold   ASSESSMENT:   CLINICAL IMPRESSION: Patient presents to PT with no current pain in back due to recently having wisdom teeth removed and having more pain in her mouth and face. She has also not been able to complete her HEP this week due to the surgery. Patient reports  she had an MRI yesterday and that they will be discussing the results with her next week. Session today focused on proximal hip and core strengthening. Patient was able to tolerate all prescribed exercises with no adverse effects. Patient continues to benefit from skilled PT services and should be progressed as able to improve functional independence.     OBJECTIVE IMPAIRMENTS: decreased endurance, decreased mobility, decreased ROM, decreased strength, hypomobility, impaired flexibility, impaired sensation, improper body mechanics, postural dysfunction, and pain.    ACTIVITY LIMITATIONS: carrying,  lifting, bending, sitting, squatting, sleeping, and transfers   PARTICIPATION LIMITATIONS: meal prep, cleaning, laundry, driving, shopping, community activity, and occupation   PERSONAL FACTORS: Time since onset of injury/illness/exacerbation are also affecting patient's functional outcome.       GOALS: Goals reviewed with patient? Yes   SHORT TERM GOALS: Target date: 11/03/2022   Pt will report understanding and adherence to initial HEP in order to promote independence in the management of primary impairments. Baseline: HEP provided at eval Goal status: INITIAL         LONG TERM GOALS: Target date: 12/01/2022   Pt will achieve an ODI score of 14% or lower in order to demonstrate improved functional ability as it relates to the pt's primary impairments. Baseline: 26% Goal status: INITIAL   2.  Pt will report ability to sit >30 minutes with 0-4/10 pain in order to go on car trips with less limitation. Baseline: >6/10 pain with 10-15 minutes of sitting Goal status: INITIAL   3.  Pt will achieve a forearm plank of 30 seconds or longer in order to promote core strength in the prophylaxis of future mechanical LBP. Baseline: 3 seconds Goal status: INITIAL   4.  Pt will achieve WNL global lumbar AROM with 0-4/10 pain in order to get dressed with less limitation. Baseline: See AROM chart,  painful motions >7/10 pain Goal status: INITIAL   5.  Pt will achieve a 5xSTS in 12 seconds or less in order to demonstrate safe and efficient community-level transfers. Baseline: 16 seconds Goal status: INITIAL     PLAN:   PT FREQUENCY: 1x/week   PT DURATION: 8 weeks   PLANNED INTERVENTIONS: Therapeutic exercises, Therapeutic activity, Neuromuscular re-education, Balance training, Gait training, Patient/Family education, Self Care, Joint mobilization, Joint manipulation, Aquatic Therapy, Dry Needling, Electrical stimulation, Spinal manipulation, Spinal mobilization, Cryotherapy, Moist heat, Taping, Traction, Biofeedback, Ionotophoresis 4mg /ml Dexamethasone, Manual therapy, and Re-evaluation.   PLAN FOR NEXT SESSION: Progress core/ hip strengthening, lumbar mobility, manual techniques to include TPDN and OMT PRN  , PTA 10/22/22 8:54 AM

## 2022-10-22 ENCOUNTER — Ambulatory Visit: Payer: Medicaid Other

## 2022-10-22 DIAGNOSIS — M5442 Lumbago with sciatica, left side: Secondary | ICD-10-CM | POA: Diagnosis not present

## 2022-10-22 DIAGNOSIS — M6281 Muscle weakness (generalized): Secondary | ICD-10-CM

## 2022-10-22 DIAGNOSIS — G8929 Other chronic pain: Secondary | ICD-10-CM

## 2022-10-24 NOTE — Progress Notes (Signed)
Internal Medicine Clinic Attending  Case discussed with the resident at the time of the visit.  We reviewed the resident's history and exam and pertinent patient test results.  I agree with the assessment, diagnosis, and plan of care documented in the resident's note.  

## 2022-11-04 ENCOUNTER — Encounter: Payer: Self-pay | Admitting: Physical Therapy

## 2022-11-04 ENCOUNTER — Ambulatory Visit
Payer: Medicaid Other | Attending: Student in an Organized Health Care Education/Training Program | Admitting: Physical Therapy

## 2022-11-04 DIAGNOSIS — G8929 Other chronic pain: Secondary | ICD-10-CM | POA: Insufficient documentation

## 2022-11-04 DIAGNOSIS — M5441 Lumbago with sciatica, right side: Secondary | ICD-10-CM | POA: Insufficient documentation

## 2022-11-04 DIAGNOSIS — M5442 Lumbago with sciatica, left side: Secondary | ICD-10-CM | POA: Diagnosis present

## 2022-11-04 DIAGNOSIS — M6281 Muscle weakness (generalized): Secondary | ICD-10-CM | POA: Insufficient documentation

## 2022-11-04 NOTE — Therapy (Signed)
OUTPATIENT PHYSICAL THERAPY TREATMENT NOTE   Patient Name: Catherine [name] MRN: [id number] DOB:05/31/2001, 21 y.o., female Today's Date: 11/04/2022  PCP: Multani, Bhupinder, MD  REFERRING PROVIDER: Vincent, Duncan Thomas, MD   END OF SESSION:   PT End of Session - 11/04/22 0823     Visit Number 4    Number of Visits 17    Date for PT Re-Evaluation 12/08/22    Authorization Type MCD [place] Complete    Authorization Time Period 10/07/2022-11/19/2022    Authorization - Visit Number 4    Authorization - Number of Visits 12    PT Start Time 0830    PT Stop Time 0911    PT Time Calculation (min) 41 min    Activity Tolerance Patient tolerated treatment well    Behavior During Therapy WFL for tasks assessed/performed              Past Medical History:  Diagnosis Date   Allergy    Asthma    past hx   Family history of brain aneurysm- father died around age 52  04/03/2020   High risk heterosexual behavior 04/03/2020   History of anemia 04/03/2020   Migraine    Pelvic pain 04/03/2020   Right lower quadrant pain 04/03/2020   Past Surgical History:  Procedure Laterality Date   TONSILLECTOMY Bilateral 08/11/2015   Procedure: CONTROL POST TONSILLECTOMY BLEEDING ;  Surgeon: Paul Juengel, MD;  Location: [facility];  Service: ENT;  Laterality: Bilateral;   TONSILLECTOMY AND ADENOIDECTOMY N/A 07/31/2015   Procedure: TONSILLECTOMY AND ADENOIDECTOMY;  Surgeon: Chapman McQueen, MD;  Location: [facility];  Service: ENT;  Laterality: N/A;   Patient Active Problem List   Diagnosis Date Noted   Blanching rash 10/19/2022   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   GERD (gastroesophageal reflux disease) 09/22/2022   Anxiety and depression 06/09/2022   Vitamin D insufficiency 04/03/2020   Menorrhagia with irregular cycle 04/03/2020   Body mass index (BMI) of 40.1-44.9 in adult (HCC) 04/03/2020   Polyuria 04/03/2020   Migraine with aura and without status migrainosus, not  intractable 04/03/2020   Acute non intractable tension-type headache 08/14/2018   Allergic rhinitis 04/01/2010   Childhood asthma, mild intermittent, uncomplicated 02/01/2007    REFERRING DIAG:  M54.41,M54.42 (ICD-10-CM) - Low back pain due to bilateral sciatica  M51.26 (ICD-10-CM) - Herniated lumbar intervertebral disc    THERAPY DIAG:  Chronic bilateral low back pain with bilateral sciatica  Muscle weakness (generalized)  Rationale for Evaluation and Treatment Rehabilitation   SUBJECTIVE:                                                                                                                                                                                        SUBJECTIVE STATEMENT:  Pt reports that overall she is doing well.  She feels like the therapy is helping.  Her pain was as high as an 8/10 yesterday, but is minimal now.   PAIN:  Are you having pain? Yes: NPRS scale: 0/10 -8/10 Pain location: Rt low back Pain description: Deep, constant Aggravating factors: sitting >10-15 minutes, standing from a supine position, driving Relieving factors: IcyHot, hot pack, Naproxen   OBJECTIVE: (objective measures completed at initial evaluation unless otherwise dated)   DIAGNOSTIC FINDINGS:  09/09/2022: CT Abdomen Pelvis W Contrast: IMPRESSION: 1. Mild features of bronchitis with no acute chest CT or CTA findings. 2. No acute abdominal or pelvic process identified. 3. Slightly prominent and steatotic liver. 4. Constipation and diverticulosis. 5. Gastroenteritis without evidence of bowel obstruction or inflammation. 6. L4-5 left paracentral herniated disc with mass effect on the left L5 nerve root. 7. Remaining findings described above.   PATIENT SURVEYS:  Modified Oswestry 13/50, 26%    SCREENING FOR RED FLAGS: Bowel or bladder incontinence: No Cauda equina syndrome: No   COGNITION: Overall cognitive status: Within functional limits for tasks assessed                           SENSATION: Light touch: Rt L2-S2 dermatomes diminished vs. Lt   Reflexes: WNL patellar and Achilles BIL   MUSCLE LENGTH: Hamstrings: WNL BIL Thomas test: WNL   POSTURE: decreased lumbar lordosis   PALPATION: TTP to Rt lumbar paraspinals/ QL   PASSIVE ACCESSORIES: CPAs hypomobile and painful L3-S1   LUMBAR ROM:    AROM eval  Flexion WNL, p!  Extension WNL  Right lateral flexion WNL  Left lateral flexion WNL  Right rotation WNL, p!  Left rotation WNL   (Blank rows = not tested)       LOWER EXTREMITY MMT:     MMT Right eval Left eval  Hip flexion 4+/5 4+/5  Hip extension 4+/5 4+/5  Hip abduction 4/5 5/5  Hip internal rotation 4+/5 4+/5  Hip external rotation 5/5 5/5  Knee flexion 5/5 5/5  Knee extension 5/5 5/5  Ankle dorsiflexion 5/5 5/5  Ankle plantarflexion 5/5 5/5   (Blank rows = not tested)   LUMBAR SPECIAL TESTS:  Slump: (+) on Rt SLR: (+) on Rt Repeated trunk extension 2x10: (+) for centralized symptoms   FUNCTIONAL TESTS:  5xSTS: 16 seconds Squat: WNL, minor increase in pain Forearm plank: 3 seconds     TODAY'S TREATMENT:      [facility] Adult PT Treatment:                                                DATE: 11/04/2022 Therapeutic Exercise: nu-step L7 5m while taking subjective and planning session with patient LE X over - 10x ea Staggered bridge - 2x10 ea Side knee plank with hip abduction 4x5 BIL Bird dog - 10x 10'' Standing Pallof press with 13# cable 2x10 with rotation and 2'' hold Upward chop - 2x10 - 7# Standing chop - 13# - x10 SL DL - 8x ea (some L ankle pain)                                                         [place] Adult PT Treatment:                                                DATE: 10/13/2022 Therapeutic Exercise: Supine bicycles with handhold resistance through thighs 3x20 Bridge 3x10 with 3-sec hold Prone superman with lat pulls 3x10 Side knee plank with hip abduction 2x10 BIL Standing Pallof press with 10#  cable 2x10 with 5-sec hold BIL Standing alternating golfer's stretch (trunk flexion into trunk rotation/extension) 2x10 BIL 45# barbell deadlift 3x8 Manual Therapy: Sidelying lumbar roll grade V manipulation x1 BIL with cavitation Neuromuscular re-ed: N/A Therapeutic Activity: N/A Modalities: N/A Self Care: N/A                                                                       [facility] Adult PT Treatment:                                                DATE: 10/05/2022 Therapeutic Exercise: Demonstrated and issued HEP with instructions on how to access on [place] Pt educated on probable underlying pathophysiology, POC, prognosis, and ODI Manual Therapy: N/A Neuromuscular re-ed: N/A Therapeutic Activity: N/A Modalities: N/A Self Care: N/A       PATIENT EDUCATION:  Education details: Pt educated on probable underlying pathophysiology, POC, prognosis, ODI, and HEP Person educated: Patient Education method: Explanation, Demonstration, and Handouts Education comprehension: verbalized understanding and returned demonstration   HOME EXERCISE PROGRAM: Access Code: [email or web address] URL: [email or web address] Date: 10/06/2022 Prepared by: Vanessa    Exercises - Sidelying Open Book Thoracic Lumbar Rotation and Extension  - 1 x daily - 7 x weekly - 2 sets - 10 reps - Standing Lumbar Extension  - 1 x daily - 7 x weekly - 3 sets - 20 reps - Seated Slump Nerve Glide  - 1 x daily - 7 x weekly - 3 sets - 20 reps - Supine 90/90 Abdominal Bracing *with handhold resistance to thighs*  - 1 x daily - 7 x weekly - 3 sets - 30 seconds hold - Side Plank on Knees  - 1 x daily - 7 x weekly - 3 sets - 30 seconds hold   ASSESSMENT:   CLINICAL IMPRESSION: Nikcole tolerated session well with no adverse reaction.  We concentrated on standing core exercises to good effect.  She does require some minor/mod cuing for form and pacing with chop, but is able to achieve good form.  She  fatigues more quickly R>L and we will continue to address this imbalance.     OBJECTIVE IMPAIRMENTS: decreased endurance, decreased mobility, decreased ROM, decreased strength, hypomobility, impaired flexibility, impaired sensation, improper body mechanics, postural dysfunction, and pain.    ACTIVITY LIMITATIONS: carrying, lifting, bending, sitting, squatting, sleeping, and transfers   PARTICIPATION LIMITATIONS: meal prep, cleaning, laundry, driving, shopping, community activity, and occupation   PERSONAL FACTORS: Time since onset of injury/illness/exacerbation are also affecting patient's functional outcome.       GOALS: Goals reviewed with patient? Yes  SHORT TERM GOALS: Target date: 11/03/2022   Pt will report understanding and adherence to initial HEP in order to promote independence in the management of primary impairments. Baseline: HEP provided at eval Goal status: Met 12/1         LONG TERM GOALS: Target date: 12/01/2022   Pt will achieve an ODI score of 14% or lower in order to demonstrate improved functional ability as it relates to the pt's primary impairments. Baseline: 26% Goal status: INITIAL   2.  Pt will report ability to sit >30 minutes with 0-4/10 pain in order to go on car trips with less limitation. Baseline: >6/10 pain with 10-15 minutes of sitting Goal status: INITIAL   3.  Pt will achieve a forearm plank of 30 seconds or longer in order to promote core strength in the prophylaxis of future mechanical LBP. Baseline: 3 seconds Goal status: INITIAL   4.  Pt will achieve WNL global lumbar AROM with 0-4/10 pain in order to get dressed with less limitation. Baseline: See AROM chart, painful motions >7/10 pain Goal status: INITIAL   5.  Pt will achieve a 5xSTS in 12 seconds or less in order to demonstrate safe and efficient community-level transfers. Baseline: 16 seconds Goal status: INITIAL     PLAN:   PT FREQUENCY: 1x/week   PT DURATION: 8  weeks   PLANNED INTERVENTIONS: Therapeutic exercises, Therapeutic activity, Neuromuscular re-education, Balance training, Gait training, Patient/Family education, Self Care, Joint mobilization, Joint manipulation, Aquatic Therapy, Dry Needling, Electrical stimulation, Spinal manipulation, Spinal mobilization, Cryotherapy, Moist heat, Taping, Traction, Biofeedback, Ionotophoresis 27m/ml Dexamethasone, Manual therapy, and Re-evaluation.   PLAN FOR NEXT SESSION: Progress core/ hip strengthening, lumbar mobility, manual techniques to include TPDN and OMT PRN  KKevan NyReinhartsen PT 11/04/22 9:12 AM

## 2022-11-10 ENCOUNTER — Ambulatory Visit: Payer: Medicaid Other

## 2022-11-16 ENCOUNTER — Ambulatory Visit
Admission: RE | Admit: 2022-11-16 | Discharge: 2022-11-16 | Disposition: A | Discharge status: Home / Self Care | Payer: Medicaid Other | Source: Ambulatory Visit

## 2022-11-16 DIAGNOSIS — H9201 Otalgia, right ear: Secondary | ICD-10-CM | POA: Diagnosis not present

## 2022-11-16 DIAGNOSIS — J019 Acute sinusitis, unspecified: Secondary | ICD-10-CM

## 2022-11-16 DIAGNOSIS — B9789 Other viral agents as the cause of diseases classified elsewhere: Secondary | ICD-10-CM

## 2022-11-16 NOTE — Discharge Instructions (Signed)
You have been diagnosed with a viral upper respiratory infection based on your symptoms and exam. Viral illnesses cannot be treated with antibiotics - they are self limiting - and you should find your symptoms resolving within a few days. Get plenty of rest and non-caffeinated fluids.  We recommend you use over-the-counter medications for symptom control including Tylenol or ibuprofen for fever, chills or body aches, and cold/cough medication.  Saline mist spray is helpful for removing excess mucus from your nose.  Room humidifiers are helpful to ease breathing at night. You might also find relief of nasal/sinus congestion symptoms by using a nasal decongestant such as Sudafed sinus (pseudoephedrine).  You will need to obtain this medication from behind the pharmacist counter.  Speak to the pharmacist to verify that you are not duplicating medications with other over-the-counter formulations that you may be using.   Follow up here or with your primary care provider if your symptoms are worsening or not improving.    

## 2022-11-16 NOTE — Progress Notes (Unsigned)
Referring Physician:  Reinaldo Raddle, MD No address on file  Primary Physician:  Patient, No Pcp Per  History of Present Illness: [name] was last seen by me on 10/06/22 for constant right sided LBP with right posterior leg pain sometimes to her knee and sometimes to her foot.   Previous CT of abdomen/pelvis showed L4-5 left paracentral herniated disc with mass effect on the left L5 nerve root.   MRI of lumbar spine ordered at last visit and she was to start PT (has done 4 visits).   She is here to review her lumbar MRI results.   She thinks she is seeing some relief with PT. She has some increased soreness after PT, but has this improves when she does her stretches at home. She continues with constant right sided LBP with right posterior leg pain sometimes to her knee and sometimes to her foot with intermittent tingling. It is still hard to get up after prolonged sitting due to pain.   She works at [employer] in [employer], political party.   Conservative measures:  Physical therapy: initial eval on 10/06/22, has done 4 visits total.  Multimodal medical therapy including regular antiinflammatories: tylenol, motrin, naproxen.  Injections: no epidural steroid injections  Past Surgery: no spinal surgery  Catherine [name] has no symptoms of cervical myelopathy.  The symptoms are causing a significant impact on the patient's life.   Review of Systems:  A 10 point review of systems is negative, except for the pertinent positives and negatives detailed in the HPI.  Past Medical History: Past Medical History:  Diagnosis Date   Allergy    Asthma    past hx   Family history of brain aneurysm- father died around age 33  04/18/2020   High risk heterosexual behavior 04-18-20   History of anemia 2020-04-18   Migraine    Pelvic pain 2020/04/18   Right lower quadrant pain 04/18/2020    Past Surgical History: Past Surgical History:  Procedure Laterality Date   TONSILLECTOMY Bilateral 08/11/2015    Procedure: CONTROL POST TONSILLECTOMY BLEEDING ;  Surgeon: Vernie Murders, MD;  Location: [facility];  Service: ENT;  Laterality: Bilateral;   TONSILLECTOMY AND ADENOIDECTOMY N/A 07/31/2015   Procedure: TONSILLECTOMY AND ADENOIDECTOMY;  Surgeon: Linus Salmons, MD;  Location: [facility];  Service: ENT;  Laterality: N/A;    Allergies: Allergies as of 11/17/2022 - Review Complete 11/17/2022  Allergen Reaction Noted   Amoxicillin Hives 06/05/2020    Medications: Outpatient Encounter Medications as of 11/17/2022  Medication Sig   Cholecalciferol (VITAMIN D) 50 MCG (2000 UT) tablet Take 1 tablet (2,000 Units total) by mouth daily.   escitalopram (LEXAPRO) 10 MG tablet Take 1 tablet (10 mg total) by mouth daily.   hydrOXYzine (ATARAX) 25 MG tablet Take 1 tablet (25 mg total) by mouth 3 (three) times daily as needed.   ipratropium (ATROVENT) 0.06 % nasal spray Place 2 sprays into both nostrils 4 (four) times daily.   meclizine (ANTIVERT) 12.5 MG tablet Take 1 tablet (12.5 mg total) by mouth 3 (three) times daily as needed for dizziness.   Multiple Vitamins-Minerals (MULTIVITAMIN WITH MINERALS) tablet Take 1 tablet by mouth daily.   ondansetron (ZOFRAN-ODT) 4 MG disintegrating tablet Take 1 tablet (4 mg total) by mouth every 8 (eight) hours as needed for nausea or vomiting.   triamcinolone ointment (KENALOG) 0.5 % Apply 1 Application topically 2 (two) times daily. Apply to areas of rash.   Vitamin D, Ergocalciferol, (DRISDOL) 1.25 MG (50000  UNIT) CAPS capsule Take 1 capsule (50,000 Units total) by mouth every 7 (seven) days.   No facility-administered encounter medications on file as of 11/17/2022.    Social History: Social History   Tobacco Use   Smoking status: Never    Passive exposure: Never   Smokeless tobacco: Never  Vaping Use   Vaping Use: Never used  Substance Use Topics   Alcohol use: Yes    Comment: once per month, 1-2 drinks   Drug use: Yes    Types: Marijuana     Family Medical History: Family History  Problem Relation Age of Onset   Hypertension Father    Aneurysm Father    Hypertension Brother    Diabetes Brother    Breast cancer Maternal Grandmother    Cancer Maternal Grandmother    Lung disease Maternal Grandmother    Bipolar disorder Mother    Depression Mother    Cancer Mother    Schizophrenia Mother    Aneurysm Paternal Grandfather    Heart attack Maternal Grandfather     Physical Examination: Vitals:   11/17/22 1104  BP: 125/88  Pulse: 79       Awake, alert, oriented to person, place, and time.  Speech is clear and fluent. Fund of knowledge is appropriate.   Cranial Nerves: Pupils equal round and reactive to light.  Facial tone is symmetric.    No abnormal lesions on exposed skin.   Strength: Side Iliopsoas Quads Hamstring PF DF EHL  R 5 5 5 5 5 5   L 5 5 5 5 5 5    Bilateral lower extremity sensation is intact to light touch.   Gait is normal.    Medical Decision Making  Imaging: Lumbar MRI dated 10/21/22:  FINDINGS: Segmentation:   5 non rib-bearing lumbar type vertebral bodies are present. The lowest fully formed vertebral body is L5.   Alignment:  Physiologic.   Vertebrae:  No fracture, evidence of discitis, or bone lesion.   Conus medullaris and cauda equina: Conus extends to the L1 level. Conus and cauda equina appear normal.   Paraspinal and other soft tissues: Negative.   Disc levels:   T12-L1: No significant disc bulge. No neural foraminal stenosis. No central canal stenosis.   L1-L2: No significant disc bulge. No neural foraminal stenosis. No central canal stenosis.   L2-L3: Mild disc bulge. No neural foraminal stenosis. No central canal stenosis.   L3-L4: Mild disc bulge with narrowing of lateral recesses bilaterally. No neural foraminal stenosis. No central canal stenosis.   L4-L5: Disc desiccation and broad-based disc bulge asymmetric to the left with moderate right and severe  left lateral recess stenosis. Mild left neural foraminal stenosis.   L5-S1: No significant disc bulge. No significant lateral recess or neural foraminal stenosis. Mild facet joint arthropathy.   IMPRESSION: 1. No evidence of acute fracture or dislocation. 2. At L4-L5 there is disc desiccation and broad-based disc bulge asymmetric to the left with moderate right and severe left lateral recess stenosis. Mild left neural foraminal stenosis. 3. At L2-L3 and L3-L4, there is mild disc bulge with narrowing of lateral recesses bilaterally.     Electronically Signed   By: D.O.   On: 10/21/2022 22:58  I have personally reviewed the images and agree with the above interpretation.  Assessment and Plan: Ms. Bywater is a pleasant 21 y.o. female with persistent right sided LBP with right posterior leg pain sometimes to her knee and sometimes to her foot. She has  seen some improvement with PT.   She has known disc bulging at L4-L5 asymmetric to left with moderate right/severe left lateral recess stenosis and mild left foraminal stenosis. This is likely her pain generator.   Treatment options discussed with patient and following plan made:   - Continue with PT for lumbar spine.  - Referral to PMR to consider lumbar injections.  - She will f/u with me in 6 weeks for a phone visit.   I spent a total of 20 minutes in face-to-face and non-face-to-face activities related to this patient's care toda including review of outside records, review of imaging, review of symptoms, physical exam, discussion of differential diagnosis, discussion of treatment options, and documentation.   Drake Leach PA-C Dept. of Neurosurgery

## 2022-11-16 NOTE — ED Provider Notes (Signed)
[facility]    CSN: [id number] Arrival date & time: 11/16/22  1526      History   Chief Complaint Chief Complaint  Patient presents with   Ear Injury    I believe I have a ear infection, when I bend down my head feels very tight like a lot of pressure and my ears hurt like a 10 on a pain scale, leading me to have a bad headache, dizziness, being unbalanced, and bad draining. - Entered by patient    HPI Catherine [name] is a 21 y.o. female.   HPI  Triage performed by provider. Presents to urgent care with concern for possible ear infection.  She states when she bends down (leans forward) her head feels "tight", "a lot of pressure".  She states her ears hurt 10/10 pain.  She endorses symptoms of headache, dizziness, feeling of being unbalanced.  She also endorses dark drainage from her right ear.  She acknowledges recent nasal congestion with maxillary sinus pressure.  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   GERD (gastroesophageal reflux disease) 09/22/2022   Anxiety and depression 06/09/2022   Vitamin D insufficiency 17-Apr-2020   Menorrhagia with irregular cycle 04/17/20   Body mass index (BMI) of 40.1-44.9 in adult (HCC) 2020/04/17   Polyuria 17-Apr-2020   Migraine with aura and without status migrainosus, not intractable Apr 17, 2020   Acute non intractable tension-type headache 08/14/2018   Allergic rhinitis 04/01/2010   Childhood asthma, mild intermittent, uncomplicated 02/01/2007    Past Surgical History:  Procedure Laterality Date   TONSILLECTOMY Bilateral 08/11/2015    Procedure: CONTROL POST TONSILLECTOMY BLEEDING ;  Surgeon: Vernie Murders, MD;  Location: [facility];  Service: ENT;  Laterality: Bilateral;   TONSILLECTOMY AND ADENOIDECTOMY N/A 07/31/2015   Procedure: TONSILLECTOMY AND ADENOIDECTOMY;  Surgeon: Linus Salmons, MD;  Location: [facility];  Service: ENT;  Laterality: N/A;    OB History     Gravida  0   Para  0   Term  0   Preterm  0   AB  0   Living  0      SAB  0   IAB  0   Ectopic  0   Multiple  0   Live Births  0            Home Medications    Prior to Admission medications   Medication Sig Start Date End Date Taking? Authorizing Provider  Cholecalciferol (VITAMIN D) 50 MCG (2000 UT) tablet Take 1 tablet (2,000 Units total) by mouth daily. 10/19/22   Merrilyn Puma, MD  escitalopram (LEXAPRO) 10 MG tablet Take 1 tablet (10 mg total) by mouth daily. 10/08/22 04/06/23  Multani, Bhupinder, MD  hydrOXYzine (ATARAX) 25 MG tablet Take 1 tablet (25 mg total) by mouth 3 (three) times daily as needed. 06/09/22   Doran Stabler, DO  ipratropium (ATROVENT) 0.06 % nasal spray Place 2 sprays into both nostrils 4 (four) times daily. 11/30/21   Becky Augusta, NP  meclizine (ANTIVERT) 12.5 MG tablet  Take 1 tablet (12.5 mg total) by mouth 3 (three) times daily as needed for dizziness. 12/13/21   Valinda Hoar, NP  Multiple Vitamins-Minerals (MULTIVITAMIN WITH MINERALS) tablet Take 1 tablet by mouth daily. 06/05/20   Federico Flake, MD  ondansetron (ZOFRAN-ODT) 4 MG disintegrating tablet Take 1 tablet (4 mg total) by mouth every 8 (eight) hours as needed for nausea or vomiting. 09/10/22   Irean Hong, MD  triamcinolone ointment (KENALOG) 0.5 % Apply 1 Application topically 2 (two) times daily. Apply to areas of rash. 10/19/22   Merrilyn Puma, MD  Vitamin D, Ergocalciferol, (DRISDOL) 1.25 MG (50000 UNIT) CAPS capsule Take 1 capsule (50,000 Units total) by mouth every 7 (seven) days. 09/23/22 11/18/22  Reinaldo Raddle, MD     Family History Family History  Problem Relation Age of Onset   Hypertension Father    Aneurysm Father    Hypertension Brother    Diabetes Brother    Breast cancer Maternal Grandmother    Cancer Maternal Grandmother    Lung disease Maternal Grandmother    Bipolar disorder Mother    Depression Mother    Cancer Mother    Schizophrenia Mother    Aneurysm Paternal Grandfather    Heart attack Maternal Grandfather     Social History Social History   Tobacco Use   Smoking status: Never    Passive exposure: Never   Smokeless tobacco: Never  Vaping Use   Vaping Use: Never used  Substance Use Topics   Alcohol use: Yes    Comment: once per month, 1-2 drinks   Drug use: Yes    Types: Marijuana     Allergies   Amoxicillin   Review of Systems Review of Systems   Physical Exam Triage Vital Signs ED Triage Vitals  Enc Vitals Group     BP      Pulse      Resp      Temp      Temp src      SpO2      Weight      Height      Head Circumference      Peak Flow      Pain Score      Pain Loc      Pain Edu?      Excl. in GC?    No data found.  Updated Vital Signs There were no vitals taken for this visit.  Visual Acuity Right Eye Distance:   Left Eye Distance:   Bilateral Distance:    Right Eye Near:   Left Eye Near:    Bilateral Near:     Physical Exam Vitals reviewed.  Constitutional:      Appearance: Normal appearance.  HENT:     Right Ear: Tympanic membrane normal. There is impacted cerumen.     Left Ear: Tympanic membrane normal.     Nose: Congestion present.     Right Sinus: Maxillary sinus tenderness present.     Left Sinus: Maxillary sinus tenderness present.  Cardiovascular:     Rate and Rhythm: Normal rate and regular rhythm.     Pulses: Normal pulses.     Heart sounds: Normal heart sounds.  Pulmonary:     Effort: Pulmonary effort is normal.     Breath sounds: Normal breath sounds.  Skin:    General: Skin is warm and dry.   Neurological:     General: No focal deficit present.     Mental Status: She  is alert and oriented to person, place, and time.  Psychiatric:        Mood and Affect: Mood normal.        Behavior: Behavior normal.      UC Treatments / Results  Labs (all labs ordered are listed, but only abnormal results are displayed) Labs Reviewed - No data to display  EKG   Radiology No results found.  Procedures Procedures (including critical care time)  Medications Ordered in UC Medications - No data to display  Initial Impression / Assessment and Plan / UC Course  I have reviewed the triage vital signs and the nursing notes.  Pertinent labs & imaging results that were available during my care of the patient were reviewed by me and considered in my medical decision making (see chart for details).   Patient is afebrile here without recent antipyretics. Satting well on room air. Overall is well appearing, well hydrated, without respiratory distress. Pulmonary exam is unremarkable.  Left TM is WNL.  Right EAC with some cerumen partially occluding the TM.  No evidence of discharge present.  Process is likely for her symptoms.  Suspect her symptom of otalgia is from sinus pressure and recommending use of Sudafed sinus.  There are no signs and symptoms of suppurative otitis media requiring treatment with antibiotics.   Final Clinical Impressions(s) / UC Diagnoses   Final diagnoses:  None   Discharge Instructions   None    ED Prescriptions   None    PDMP not reviewed this encounter.   Charma Igo, [place] 11/16/22 1616

## 2022-11-17 ENCOUNTER — Encounter: Payer: Self-pay | Admitting: Orthopedic Surgery

## 2022-11-17 ENCOUNTER — Ambulatory Visit: Payer: Medicaid Other

## 2022-11-17 ENCOUNTER — Ambulatory Visit (INDEPENDENT_AMBULATORY_CARE_PROVIDER_SITE_OTHER): Payer: Medicaid Other | Admitting: Orthopedic Surgery

## 2022-11-17 VITALS — BP 125/88 | HR 79 | Ht 63.0 in | Wt 244.2 lb

## 2022-11-17 DIAGNOSIS — G8929 Other chronic pain: Secondary | ICD-10-CM

## 2022-11-17 DIAGNOSIS — M5442 Lumbago with sciatica, left side: Secondary | ICD-10-CM | POA: Diagnosis not present

## 2022-11-17 DIAGNOSIS — M6281 Muscle weakness (generalized): Secondary | ICD-10-CM

## 2022-11-17 DIAGNOSIS — M4726 Other spondylosis with radiculopathy, lumbar region: Secondary | ICD-10-CM | POA: Diagnosis not present

## 2022-11-17 DIAGNOSIS — M5136 Other intervertebral disc degeneration, lumbar region: Secondary | ICD-10-CM | POA: Diagnosis not present

## 2022-11-17 DIAGNOSIS — M5416 Radiculopathy, lumbar region: Secondary | ICD-10-CM

## 2022-11-17 DIAGNOSIS — M47816 Spondylosis without myelopathy or radiculopathy, lumbar region: Secondary | ICD-10-CM

## 2022-11-17 NOTE — Patient Instructions (Signed)
It was so nice to see you today.   Your MRI shows a disc bulge at L4-L5 and this is likely causing a lot of your pain.   Continue with physical therapy.   I sent a referral to Physical Medicine and Rehab at the [facility] so you can talk to them about lumbar injections. They should call you or you can call them at [phone]. You will see either Dr. Yves Dill or Dr. Mariah Milling. They have a PA named Whitney as well. They are all great and will take good care of you.   I scheduled a phone visit for you in 6 weeks.   Please do not hesitate to call if you have any questions or concerns. You can also message me in MyChart.   If you have not heard back about any of the tests/procedures in the next week, please call the office so we can help you get these things scheduled.   Drake Leach PA-C [phone]

## 2022-11-17 NOTE — Therapy (Signed)
OUTPATIENT PHYSICAL THERAPY TREATMENT NOTE   Patient Name: Catherine [name] MRN: [id number] DOB:2001-05-02, 21 y.o., female Today's Date: 11/17/2022  PCP: Teola Bradley, MD  REFERRING PROVIDER: Axel Filler, MD   END OF SESSION:   PT End of Session - 11/17/22 1442     Visit Number 5    Number of Visits 17    Date for PT Re-Evaluation 12/08/22    Authorization Type MCD [place] Complete    Authorization Time Period 10/07/2022-11/19/2022    Authorization - Visit Number 5    Authorization - Number of Visits 12    PT Start Time 1007    PT Stop Time 1522    PT Time Calculation (min) 40 min    Activity Tolerance Patient tolerated treatment well    Behavior During Therapy [facility] for tasks assessed/performed              Past Medical History:  Diagnosis Date   Allergy    Asthma    past hx   Family history of brain aneurysm- father died around age 20  04/04/20   High risk heterosexual behavior 04-Apr-2020   History of anemia 04/04/20   Migraine    Pelvic pain 04/04/2020   Right lower quadrant pain 04/04/2020   Past Surgical History:  Procedure Laterality Date   TONSILLECTOMY Bilateral 08/11/2015   Procedure: CONTROL POST TONSILLECTOMY BLEEDING ;  Surgeon: Margaretha Sheffield, MD;  Location: [facility];  Service: ENT;  Laterality: Bilateral;   TONSILLECTOMY AND ADENOIDECTOMY N/A 07/31/2015   Procedure: TONSILLECTOMY AND ADENOIDECTOMY;  Surgeon: Beverly Gust, MD;  Location: [place];  Service: ENT;  Laterality: N/A;   Patient Active Problem List   Diagnosis Date Noted   Blanching rash 10/19/2022   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   GERD (gastroesophageal reflux disease) 09/22/2022   Anxiety and depression 06/09/2022   Vitamin D insufficiency 04/04/20   Menorrhagia with irregular cycle 2020-04-04   Body mass index (BMI) of 40.1-44.9 in adult [facility]) 04/04/2020   Polyuria Apr 04, 2020   Migraine with aura and without status migrainosus, not  intractable 04/04/20   Acute non intractable tension-type headache 08/14/2018   Allergic rhinitis 04/01/2010   Childhood asthma, mild intermittent, uncomplicated 11/23/7587    REFERRING DIAG:  M54.41,M54.42 (ICD-10-CM) - Low back pain due to bilateral sciatica  M51.26 (ICD-10-CM) - Herniated lumbar intervertebral disc    THERAPY DIAG:  Chronic bilateral low back pain with bilateral sciatica  Muscle weakness (generalized)  Rationale for Evaluation and Treatment Rehabilitation   SUBJECTIVE:  SUBJECTIVE STATEMENT:  Patient reports her pain has been very high the past couple of days, up to a 10/10, R>L.   PAIN:  Are you having pain? Yes: NPRS scale: 7/10 Pain location: Rt low back Pain description: Deep, constant Aggravating factors: sitting >10-15 minutes, standing from a supine position, driving Relieving factors: IcyHot, hot pack, Naproxen   OBJECTIVE: (objective measures completed at initial evaluation unless otherwise dated)   DIAGNOSTIC FINDINGS:  09/09/2022: CT Abdomen Pelvis W Contrast: IMPRESSION: 1. Mild features of bronchitis with no acute chest CT or CTA findings. 2. No acute abdominal or pelvic process identified. 3. Slightly prominent and steatotic liver. 4. Constipation and diverticulosis. 5. Gastroenteritis without evidence of bowel obstruction or inflammation. 6. L4-5 left paracentral herniated disc with mass effect on the left L5 nerve root. 7. Remaining findings described above.   PATIENT SURVEYS:  Modified Oswestry 13/50, 26%    SCREENING FOR RED FLAGS: Bowel or bladder incontinence: No Cauda equina syndrome: No   COGNITION: Overall cognitive status: Within functional limits for tasks assessed                          SENSATION: Light touch: Rt L2-S2 dermatomes  diminished vs. Lt   Reflexes: WNL patellar and Achilles BIL   MUSCLE LENGTH: Hamstrings: WNL BIL Thomas test: WNL   POSTURE: decreased lumbar lordosis   PALPATION: TTP to Rt lumbar paraspinals/ QL   PASSIVE ACCESSORIES: CPAs hypomobile and painful L3-S1   LUMBAR ROM:    AROM eval  Flexion WNL, p!  Extension WNL  Right lateral flexion WNL  Left lateral flexion WNL  Right rotation WNL, p!  Left rotation WNL   (Blank rows = not tested)       LOWER EXTREMITY MMT:     MMT Right eval Left eval  Hip flexion 4+/5 4+/5  Hip extension 4+/5 4+/5  Hip abduction 4/5 5/5  Hip internal rotation 4+/5 4+/5  Hip external rotation 5/5 5/5  Knee flexion 5/5 5/5  Knee extension 5/5 5/5  Ankle dorsiflexion 5/5 5/5  Ankle plantarflexion 5/5 5/5   (Blank rows = not tested)   LUMBAR SPECIAL TESTS:  Slump: (+) on Rt SLR: (+) on Rt Repeated trunk extension 2x10: (+) for centralized symptoms   FUNCTIONAL TESTS:  5xSTS: 16 seconds Squat: WNL, minor increase in pain Forearm plank: 3 seconds     TODAY'S TREATMENT:    [facility] Adult PT Treatment:                                                DATE: 11/17/22 Therapeutic Exercise: nu-step L7 23mwhile taking subjective and planning session with patient Bridge - 2x10 ea Standing Pallof press with 17# cable 2x10 BIL Upward chop - 2x10 - 7# Standing chop - 13# - 2x10 Sidelying hip abduction 2# 2x10 BIL SLR 2x10 2# BIL LTR x10 BIL Open books x10 BIL Pball roll outs x10 forward     [facility] Adult PT Treatment:                                                DATE: 11/04/2022 Therapeutic Exercise: nu-step L7 561mhile taking subjective and planning session  with patient LE X over - 10x ea Staggered bridge - 2x10 ea Side knee plank with hip abduction 4x5 BIL Bird dog - 10x 10'' Standing Pallof press with 13# cable 2x10 with rotation and 2'' hold Upward chop - 2x10 - 7# Standing chop - 13# - x10 SL DL - 8x ea (some L ankle pain)                                                        [facility] Adult PT Treatment:                                                DATE: 10/13/2022 Therapeutic Exercise: Supine bicycles with handhold resistance through thighs 3x20 Bridge 3x10 with 3-sec hold Prone superman with lat pulls 3x10 Side knee plank with hip abduction 2x10 BIL Standing Pallof press with 10# cable 2x10 with 5-sec hold BIL Standing alternating golfer's stretch (trunk flexion into trunk rotation/extension) 2x10 BIL 45# barbell deadlift 3x8 Manual Therapy: Sidelying lumbar roll grade V manipulation x1 BIL with cavitation Neuromuscular re-ed: N/A Therapeutic Activity: N/A Modalities: N/A Self Care: N/A                                                                         PATIENT EDUCATION:  Education details: Pt educated on probable underlying pathophysiology, POC, prognosis, ODI, and HEP Person educated: Patient Education method: Explanation, Demonstration, and Handouts Education comprehension: verbalized understanding and returned demonstration   HOME EXERCISE PROGRAM: Access Code: [email or web address] URL: [email or web address] Date: 10/06/2022 Prepared by: Vanessa [place]   Exercises - Sidelying Open Book Thoracic Lumbar Rotation and Extension  - 1 x daily - 7 x weekly - 2 sets - 10 reps - Standing Lumbar Extension  - 1 x daily - 7 x weekly - 3 sets - 20 reps - Seated Slump Nerve Glide  - 1 x daily - 7 x weekly - 3 sets - 20 reps - Supine 90/90 Abdominal Bracing *with handhold resistance to thighs*  - 1 x daily - 7 x weekly - 3 sets - 30 seconds hold - Side Plank on Knees  - 1 x daily - 7 x weekly - 3 sets - 30 seconds hold   ASSESSMENT:   CLINICAL IMPRESSION: Patient presents to PT with increased back pain today that she states has been high for a couple of days, getting to a 10/10 at the worst. Session today focused on core and proximal hip strengthening within patients pain tolerance. Patient was able to  tolerate all prescribed exercises with no adverse effects. She reports a decrease in pain to 4/10 at end of session. Patient continues to benefit from skilled PT services and should be progressed as able to improve functional independence.     OBJECTIVE IMPAIRMENTS: decreased endurance, decreased mobility, decreased ROM, decreased strength, hypomobility, impaired flexibility, impaired sensation, improper body mechanics, postural dysfunction, and pain.    ACTIVITY LIMITATIONS: carrying,  lifting, bending, sitting, squatting, sleeping, and transfers   PARTICIPATION LIMITATIONS: meal prep, cleaning, laundry, driving, shopping, community activity, and occupation   PERSONAL FACTORS: Time since onset of injury/illness/exacerbation are also affecting patient's functional outcome.       GOALS: Goals reviewed with patient? Yes   SHORT TERM GOALS: Target date: 11/03/2022   Pt will report understanding and adherence to initial HEP in order to promote independence in the management of primary impairments. Baseline: HEP provided at eval Goal status: Met 12/1         LONG TERM GOALS: Target date: 12/01/2022   Pt will achieve an ODI score of 14% or lower in order to demonstrate improved functional ability as it relates to the pt's primary impairments. Baseline: 26% Goal status: INITIAL   2.  Pt will report ability to sit >30 minutes with 0-4/10 pain in order to go on car trips with less limitation. Baseline: >6/10 pain with 10-15 minutes of sitting Goal status: INITIAL   3.  Pt will achieve a forearm plank of 30 seconds or longer in order to promote core strength in the prophylaxis of future mechanical LBP. Baseline: 3 seconds Goal status: INITIAL   4.  Pt will achieve WNL global lumbar AROM with 0-4/10 pain in order to get dressed with less limitation. Baseline: See AROM chart, painful motions >7/10 pain Goal status: INITIAL   5.  Pt will achieve a 5xSTS in 12 seconds or less in order to  demonstrate safe and efficient community-level transfers. Baseline: 16 seconds Goal status: INITIAL     PLAN:   PT FREQUENCY: 1x/week   PT DURATION: 8 weeks   PLANNED INTERVENTIONS: Therapeutic exercises, Therapeutic activity, Neuromuscular re-education, Balance training, Gait training, Patient/Family education, Self Care, Joint mobilization, Joint manipulation, Aquatic Therapy, Dry Needling, Electrical stimulation, Spinal manipulation, Spinal mobilization, Cryotherapy, Moist heat, Taping, Traction, Biofeedback, Ionotophoresis 34m/ml Dexamethasone, Manual therapy, and Re-evaluation.   PLAN FOR NEXT SESSION: Progress core/ hip strengthening, lumbar mobility, manual techniques to include TPDN and OMT PRN  SMargarette CanadaPTA 11/17/22 3:23 PM

## 2022-11-23 NOTE — Therapy (Signed)
OUTPATIENT PHYSICAL THERAPY TREATMENT NOTE   Patient Name: Catherine [name] MRN: [id number] DOB:[birth year], 21 y.o., female Today's Date: 11/24/2022  PCP: Teola Bradley, MD  REFERRING PROVIDER: Axel Filler, MD   END OF SESSION:   PT End of Session - 11/24/22 1450     Visit Number 6    Number of Visits 17    Date for PT Re-Evaluation 12/08/22    Authorization Type MCD [place] Complete    Authorization Time Period 8 visits 11/3-12/29    Authorization - Visit Number 6    Authorization - Number of Visits 12    PT Start Time 8811    PT Stop Time 1528    PT Time Calculation (min) 39 min    Activity Tolerance Patient tolerated treatment well    Behavior During Therapy [facility] for tasks assessed/performed               Past Medical History:  Diagnosis Date   Allergy    Asthma    past hx   Family history of brain aneurysm- father died around age 59  05/02/20   High risk heterosexual behavior May 02, 2020   History of anemia 02-May-2020   Migraine    Pelvic pain May 02, 2020   Right lower quadrant pain 02-May-2020   Past Surgical History:  Procedure Laterality Date   TONSILLECTOMY Bilateral 08/11/2015   Procedure: CONTROL POST TONSILLECTOMY BLEEDING ;  Surgeon: Margaretha Sheffield, MD;  Location: [facility];  Service: ENT;  Laterality: Bilateral;   TONSILLECTOMY AND ADENOIDECTOMY N/A 07/31/2015   Procedure: TONSILLECTOMY AND ADENOIDECTOMY;  Surgeon: Beverly Gust, MD;  Location: [place];  Service: ENT;  Laterality: N/A;   Patient Active Problem List   Diagnosis Date Noted   Blanching rash 10/19/2022   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   GERD (gastroesophageal reflux disease) 09/22/2022   Anxiety and depression 06/09/2022   Vitamin D insufficiency May 02, 2020   Menorrhagia with irregular cycle May 02, 2020   Body mass index (BMI) of 40.1-44.9 in adult [facility]) 05-02-2020   Polyuria May 02, 2020   Migraine with aura and without status migrainosus, not  intractable May 02, 2020   Acute non intractable tension-type headache 08/14/2018   Allergic rhinitis 04/01/2010   Childhood asthma, mild intermittent, uncomplicated 02/16/9457    REFERRING DIAG:  M54.41,M54.42 (ICD-10-CM) - Low back pain due to bilateral sciatica  M51.26 (ICD-10-CM) - Herniated lumbar intervertebral disc    THERAPY DIAG:  Chronic bilateral low back pain with bilateral sciatica  Muscle weakness (generalized)  Rationale for Evaluation and Treatment Rehabilitation   SUBJECTIVE:  SUBJECTIVE STATEMENT:  Patient reports that her pain is lower today and she has been feeling more stiffness than pain lately.   PAIN:  Are you having pain? Yes: NPRS scale: 2/10 Pain location: Rt low back Pain description: Deep, constant Aggravating factors: sitting >10-15 minutes, standing from a supine position, driving Relieving factors: IcyHot, hot pack, Naproxen   OBJECTIVE: (objective measures completed at initial evaluation unless otherwise dated)   DIAGNOSTIC FINDINGS:  09/09/2022: CT Abdomen Pelvis W Contrast: IMPRESSION: 1. Mild features of bronchitis with no acute chest CT or CTA findings. 2. No acute abdominal or pelvic process identified. 3. Slightly prominent and steatotic liver. 4. Constipation and diverticulosis. 5. Gastroenteritis without evidence of bowel obstruction or inflammation. 6. L4-5 left paracentral herniated disc with mass effect on the left L5 nerve root. 7. Remaining findings described above.   PATIENT SURVEYS:  Modified Oswestry 13/50, 26%    SCREENING FOR RED FLAGS: Bowel or bladder incontinence: No Cauda equina syndrome: No   COGNITION: Overall cognitive status: Within functional limits for tasks assessed                          SENSATION: Light touch: Rt L2-S2  dermatomes diminished vs. Lt   Reflexes: WNL patellar and Achilles BIL   MUSCLE LENGTH: Hamstrings: WNL BIL Thomas test: WNL   POSTURE: decreased lumbar lordosis   PALPATION: TTP to Rt lumbar paraspinals/ QL   PASSIVE ACCESSORIES: CPAs hypomobile and painful L3-S1   LUMBAR ROM:    AROM eval  Flexion WNL, p!  Extension WNL  Right lateral flexion WNL  Left lateral flexion WNL  Right rotation WNL, p!  Left rotation WNL   (Blank rows = not tested)       LOWER EXTREMITY MMT:     MMT Right eval Left eval  Hip flexion 4+/5 4+/5  Hip extension 4+/5 4+/5  Hip abduction 4/5 5/5  Hip internal rotation 4+/5 4+/5  Hip external rotation 5/5 5/5  Knee flexion 5/5 5/5  Knee extension 5/5 5/5  Ankle dorsiflexion 5/5 5/5  Ankle plantarflexion 5/5 5/5   (Blank rows = not tested)   LUMBAR SPECIAL TESTS:  Slump: (+) on Rt SLR: (+) on Rt Repeated trunk extension 2x10: (+) for centralized symptoms   FUNCTIONAL TESTS:  5xSTS: 16 seconds Squat: WNL, minor increase in pain Forearm plank: 3 seconds     TODAY'S TREATMENT:    [facility] Adult PT Treatment:                                                DATE: 11/24/2022 Therapeutic Exercise: nu-step L7 73mwhile taking subjective and planning session with patient Bridge with clam BlueTB 2x10 Standing Pallof press with 17# cable 2x10 BIL Sidelying hip abduction 2# 2x10 BIL SLR 2x10 2# BIL LTR x10 BIL Open books x10 BIL Pball roll outs x15 each forward/lateral Bird dogs 2x10 Dead bugs 2x10   [facility] Adult PT Treatment:                                                DATE: 11/17/22 Therapeutic Exercise: nu-step L7 511mhile taking subjective and planning session with patient Bridge - 2x10 ea  Standing Pallof press with 17# cable 2x10 BIL Upward chop - 2x10 - 7# Standing chop - 13# - 2x10 Sidelying hip abduction 2# 2x10 BIL SLR 2x10 2# BIL LTR x10 BIL Open books x10 BIL Pball roll outs x10 forward     [facility] Adult PT Treatment:                                                 DATE: 11/04/2022 Therapeutic Exercise: nu-step L7 3mwhile taking subjective and planning session with patient LE X over - 10x ea Staggered bridge - 2x10 ea Side knee plank with hip abduction 4x5 BIL Bird dog - 10x 10'' Standing Pallof press with 13# cable 2x10 with rotation and 2'' hold Upward chop - 2x10 - 7# Standing chop - 13# - x10 SL DL - 8x ea (some L ankle pain)                                                                                                                              PATIENT EDUCATION:  Education details: Pt educated on probable underlying pathophysiology, POC, prognosis, ODI, and HEP Person educated: Patient Education method: Explanation, Demonstration, and Handouts Education comprehension: verbalized understanding and returned demonstration   HOME EXERCISE PROGRAM: Access Code: [email or web address]: [email or web address] Date: 10/06/2022 Prepared by: TVanessa [place]  Exercises - Sidelying Open Book Thoracic Lumbar Rotation and Extension  - 1 x daily - 7 x weekly - 2 sets - 10 reps - Standing Lumbar Extension  - 1 x daily - 7 x weekly - 3 sets - 20 reps - Seated Slump Nerve Glide  - 1 x daily - 7 x weekly - 3 sets - 20 reps - Supine 90/90 Abdominal Bracing *with handhold resistance to thighs*  - 1 x daily - 7 x weekly - 3 sets - 30 seconds hold - Side Plank on Knees  - 1 x daily - 7 x weekly - 3 sets - 30 seconds hold   ASSESSMENT:   CLINICAL IMPRESSION: Patient presents to PT with decreased lower back pain today and reports more stiffness than pain lately, even while at work. Session today focused on core and proximal hip strengthening. Re-administered ODI this session with patient meeting her LTG, showing increased function since beginning PT. Patient was able to tolerate all prescribed exercises with no adverse effects. Patient continues to benefit from skilled PT services and should be progressed  as able to improve functional independence.    OBJECTIVE IMPAIRMENTS: decreased endurance, decreased mobility, decreased ROM, decreased strength, hypomobility, impaired flexibility, impaired sensation, improper body mechanics, postural dysfunction, and pain.    ACTIVITY LIMITATIONS: carrying, lifting, bending, sitting, squatting, sleeping, and transfers   PARTICIPATION LIMITATIONS: meal prep, cleaning, laundry, driving, shopping, community activity, and occupation   PERSONAL FACTORS: Time since onset of injury/illness/exacerbation  are also affecting patient's functional outcome.       GOALS: Goals reviewed with patient? Yes   SHORT TERM GOALS: Target date: 11/03/2022   Pt will report understanding and adherence to initial HEP in order to promote independence in the management of primary impairments. Baseline: HEP provided at eval Goal status: Met 12/1         LONG TERM GOALS: Target date: 12/01/2022   Pt will achieve an ODI score of 14% or lower in order to demonstrate improved functional ability as it relates to the pt's primary impairments. Baseline: 26% Goal status: MET 11/24/22: 14%   2.  Pt will report ability to sit >30 minutes with 0-4/10 pain in order to go on car trips with less limitation. Baseline: >6/10 pain with 10-15 minutes of sitting Goal status: INITIAL   3.  Pt will achieve a forearm plank of 30 seconds or longer in order to promote core strength in the prophylaxis of future mechanical LBP. Baseline: 3 seconds Goal status: INITIAL   4.  Pt will achieve WNL global lumbar AROM with 0-4/10 pain in order to get dressed with less limitation. Baseline: See AROM chart, painful motions >7/10 pain Goal status: INITIAL   5.  Pt will achieve a 5xSTS in 12 seconds or less in order to demonstrate safe and efficient community-level transfers. Baseline: 16 seconds Goal status: INITIAL     PLAN:   PT FREQUENCY: 1x/week   PT DURATION: 8 weeks   PLANNED  INTERVENTIONS: Therapeutic exercises, Therapeutic activity, Neuromuscular re-education, Balance training, Gait training, Patient/Family education, Self Care, Joint mobilization, Joint manipulation, Aquatic Therapy, Dry Needling, Electrical stimulation, Spinal manipulation, Spinal mobilization, Cryotherapy, Moist heat, Taping, Traction, Biofeedback, Ionotophoresis 38m/ml Dexamethasone, Manual therapy, and Re-evaluation.   PLAN FOR NEXT SESSION: Progress core/ hip strengthening, lumbar mobility, manual techniques to include TPDN and OMT PRN  SMargarette CanadaPTA 11/24/22 3:28 PM

## 2022-11-24 ENCOUNTER — Ambulatory Visit: Payer: Medicaid Other

## 2022-11-24 DIAGNOSIS — M6281 Muscle weakness (generalized): Secondary | ICD-10-CM

## 2022-11-24 DIAGNOSIS — M5442 Lumbago with sciatica, left side: Secondary | ICD-10-CM | POA: Diagnosis not present

## 2022-11-24 DIAGNOSIS — G8929 Other chronic pain: Secondary | ICD-10-CM

## 2022-12-08 ENCOUNTER — Ambulatory Visit: Payer: Medicaid Other

## 2022-12-14 ENCOUNTER — Telehealth: Payer: Self-pay

## 2022-12-14 NOTE — Telephone Encounter (Signed)
Patient notified of Stacy's response.

## 2022-12-14 NOTE — Telephone Encounter (Signed)
I don't think abdominal cramping is from her back. I recommend that she follow up with her PCP for this.

## 2022-12-14 NOTE — Telephone Encounter (Signed)
-----   Message from Peggyann Shoals sent at 12/13/2022 12:23 PM EST ----- Regarding: pain Contact: [phone] She is currently on her cycle. She does not feel that her symptoms are because of her cycle but she woke up one day with severe stomach cramps (more then normal when she is on her cycle) and numbness down left leg. She feels the numbness and pain is getting worse. She waited a few days to see if it was due to her cycle but since it has lasted longer than a few days she feels it might be related to her herniated disc. The cramping is on the same side has her herniated disc. What can you recommend? She is aware office closing early, we will return her call in 24 hours.

## 2022-12-14 NOTE — Telephone Encounter (Signed)
Thoughts?  She has been doing PT and is scheduled to see Dr. Alba Destine tomorrow. She has a telephone visit with you on 01/05/23.

## 2022-12-14 NOTE — Telephone Encounter (Signed)
Left message to call back  

## 2023-01-02 NOTE — Progress Notes (Signed)
   Telephone Visit- Progress Note: Referring Physician:  Gwenevere Abbot, MD [address] [place],  [place] [address]  Primary Physician:  Gwenevere Abbot, MD  This visit was performed via telephone.  Patient location: home Provider location: office  I spent a total of 15 minutes non-face-to-face activities for this visit on the date of this encounter including review of current clinical condition and response to treatment.    Patient has given verbal consent to this telephone visits and we reviewed the limitations of a telephone visit. Patient wishes to proceed.    Chief Complaint:  f/u after   History of Present Illness: Catherine [name] is a 22 y.o. female has a history of migraines, GERD.   Last seen by me on 11/17/22 for recheck of lumbar spine. She has known disc bulging at L4-L5 asymmetric to left with moderate right/severe left lateral recess stenosis and mild left foraminal stenosis.  I thought this was likely her pain generator.    She was to continue with PT and was to see PMR to discuss lumbar injection.   She did 3 more visits of PT since last visit then she got sick. She has been doing her HEP with some improvement. She is scheduled to see Dr. Mariah Milling on 01/15/23.   Her pain has changed since her last visit. Her right sided back and leg pain is mostly gone. She has more constant left sided LBP with left posterior leg pain to her foot. She has numbness and tingling in her left leg. She has some weakness in left leg as well. Pain is worse with standing and walking. She does okay sitting.    Exam: No exam done as this was a telephone encounter.     Imaging: No updated lumbar imaging.   Assessment and Plan: [name] is a pleasant 22 y.o. female with improvement in right sided LBP and right posterior leg pain. This is mostly gone. She now has more constant left sided LBP with left posterior leg pain to her foot. She has numbness, tingling, and some feelings of weakness in  her left leg.    She has known disc bulging at L4-L5 asymmetric to left with moderate right/severe left lateral recess stenosis and mild left foraminal stenosis. This is likely her pain generator.   Treatment options discussed with patient and following plan made:   - Continue HEP from PT.  - Stop naproxen. New prescription for voltaren sent to pharmacy. Reviewed dosing and side effects. Take with food. Advised to stop if any GI upset. Reviewed risk of GI bleed with NSAIDs and lexapro. Will stop if any issues. She has no issues with naproxen.  - Discussed trial of neurontin. She has taken previously and had side effects. She declines.  - Keep appointment with Dr. Mariah Milling on 01/15/23 to discuss possible lumbar injections.  - Follow up with me in 6-8 weeks for recheck.   Drake Leach PA-C Neurosurgery

## 2023-01-05 ENCOUNTER — Encounter: Payer: Self-pay | Admitting: Orthopedic Surgery

## 2023-01-05 ENCOUNTER — Telehealth: Payer: Self-pay

## 2023-01-05 ENCOUNTER — Ambulatory Visit (INDEPENDENT_AMBULATORY_CARE_PROVIDER_SITE_OTHER): Payer: Medicaid Other | Admitting: Orthopedic Surgery

## 2023-01-05 DIAGNOSIS — M5136 Other intervertebral disc degeneration, lumbar region: Secondary | ICD-10-CM | POA: Diagnosis not present

## 2023-01-05 DIAGNOSIS — M51369 Other intervertebral disc degeneration, lumbar region without mention of lumbar back pain or lower extremity pain: Secondary | ICD-10-CM

## 2023-01-05 DIAGNOSIS — M4726 Other spondylosis with radiculopathy, lumbar region: Secondary | ICD-10-CM

## 2023-01-05 DIAGNOSIS — M5416 Radiculopathy, lumbar region: Secondary | ICD-10-CM

## 2023-01-05 DIAGNOSIS — M47816 Spondylosis without myelopathy or radiculopathy, lumbar region: Secondary | ICD-10-CM

## 2023-01-05 MED ORDER — DICLOFENAC SODIUM 75 MG PO TBEC: 75.0000 mg | DELAYED_RELEASE_TABLET | Freq: Two times a day (BID) | ORAL | 2 refills | Status: DC

## 2023-01-05 NOTE — Telephone Encounter (Signed)
Authorization for Diclofenac has been submitted through [facility] and has been sent to the plan for review.  PA Case ID #: [id number]

## 2023-01-06 NOTE — Telephone Encounter (Signed)
I have faxed this to the pharmacy as well.

## 2023-01-06 NOTE — Telephone Encounter (Signed)
Medication has been approved. Auth# [id number] and is valid from 01/05/23 to 01/05/24.

## 2023-01-16 ENCOUNTER — Encounter: Payer: Self-pay | Admitting: Emergency Medicine

## 2023-01-16 ENCOUNTER — Other Ambulatory Visit: Payer: Self-pay

## 2023-01-16 ENCOUNTER — Emergency Department: Payer: Medicaid Other

## 2023-01-16 DIAGNOSIS — R55 Syncope and collapse: Secondary | ICD-10-CM | POA: Insufficient documentation

## 2023-01-16 DIAGNOSIS — R42 Dizziness and giddiness: Secondary | ICD-10-CM | POA: Diagnosis present

## 2023-01-16 DIAGNOSIS — I10 Essential (primary) hypertension: Secondary | ICD-10-CM | POA: Insufficient documentation

## 2023-01-16 DIAGNOSIS — R079 Chest pain, unspecified: Secondary | ICD-10-CM | POA: Diagnosis not present

## 2023-01-16 LAB — TROPONIN I (HIGH SENSITIVITY): Troponin I (High Sensitivity): <2 ng/L (ref <18)

## 2023-01-16 LAB — CBC
HCT: 40.9 % (ref 36.0–46.0)
Hemoglobin: 12.6 g/dL (ref 12.0–15.0)
MCH: 23.1 pg — ABNORMAL LOW (ref 26.0–34.0)
MCHC: 30.8 g/dL (ref 30.0–36.0)
MCV: 74.9 fL — ABNORMAL LOW (ref 80.0–100.0)
Platelets: 438 10*3/uL — ABNORMAL HIGH (ref 150–400)
RBC: 5.46 MIL/uL — ABNORMAL HIGH (ref 3.87–5.11)
RDW: 15.5 % (ref 11.5–15.5)
WBC: 10.6 10*3/uL — ABNORMAL HIGH (ref 4.0–10.5)
nRBC: 0 % (ref 0.0–0.2)

## 2023-01-16 LAB — BASIC METABOLIC PANEL
Anion gap: 10 (ref 5–15)
BUN: 9 mg/dL (ref 6–20)
CO2: 27 mmol/L (ref 22–32)
Calcium: 9.3 mg/dL (ref 8.9–10.3)
Chloride: 103 mmol/L (ref 98–111)
Creatinine, Ser: 0.64 mg/dL (ref 0.44–1.00)
GFR, Estimated: >60 mL/min (ref >60)
Glucose, Bld: 103 mg/dL — ABNORMAL HIGH (ref 70–99)
Potassium: 3.6 mmol/L (ref 3.5–5.1)
Sodium: 140 mmol/L (ref 135–145)

## 2023-01-16 LAB — POC URINE PREG, ED: Preg Test, Ur: NEGATIVE

## 2023-01-16 NOTE — ED Triage Notes (Signed)
Pt to ED from work c/o HTN and near syncope tonight.  States was bending over when she got dizzy and sat down, denies LOC or hitting head.  States some chest tightness, SOB, and headache.  States BP at work was 161/107, denies hx of HTN.  Pt A&Ox4, chest rise even and unlabored, skin WNL and in NAD at this time.

## 2023-01-17 ENCOUNTER — Emergency Department
Admission: EM | Admit: 2023-01-17 | Discharge: 2023-01-17 | Disposition: A | Discharge status: Home / Self Care | Payer: Medicaid Other | Attending: Emergency Medicine | Admitting: Emergency Medicine

## 2023-01-17 DIAGNOSIS — R55 Syncope and collapse: Secondary | ICD-10-CM

## 2023-01-17 LAB — TROPONIN I (HIGH SENSITIVITY): Troponin I (High Sensitivity): 3 ng/L (ref <18)

## 2023-01-17 MED ORDER — ONDANSETRON 4 MG PO TBDP: ORAL_TABLET | ORAL | 0 refills | Status: DC

## 2023-01-17 NOTE — ED Notes (Signed)
Went over d/c paperwork at this time with patient. Pt had no questions, comments or concerns after review and verbally understood them.

## 2023-01-17 NOTE — Discharge Instructions (Addendum)
Your workup in the Emergency Department today was reassuring.  We did not find any specific abnormalities.  We recommend you drink plenty of fluids, take your regular medications and/or any new ones prescribed today, and follow up with the doctor(s) listed in these documents as recommended.  Return to the Emergency Department if you develop new or worsening symptoms that concern you.  

## 2023-01-17 NOTE — ED Provider Notes (Signed)
[facility] Provider Note    Event Date/Time   First MD Initiated Contact with Patient 01/17/23 0201     (approximate)   History   Hypertension   HPI  Catherine [name] is a 22 y.o. female who presents for evaluation of almost passing out and concerned about her blood pressure.  She reports that she is not felt well recently but without any specific symptoms.  She was working tonight and bent over at the waist when she got dizzy and then sat down.  She did not lose consciousness but she thought she might pass out.  Afterwards she developed some chest tightness and shortness of breath.  She had some headache.  She said that she checked her blood pressure at work and it was 161/107 and she has no history of high blood pressure.  She said that her father died from a brain aneurysm so she was afraid she might also.  She came in for evaluation.  She says she feels much better now with no current symptoms.     Physical Exam   Triage Vital Signs: ED Triage Vitals [01/16/23 2141]  Enc Vitals Group     BP (!) 139/91     Pulse Rate 82     Resp 16     Temp 98.2 F (36.8 C)     Temp Source Oral     SpO2 97 %     Weight 114.8 kg (253 lb)     Height 1.6 m (5' 3"$ )     Head Circumference      Peak Flow      Pain Score 9     Pain Loc      Pain Edu?      Excl. in [place]?     Most recent vital signs: Vitals:   01/17/23 0340 01/17/23 0400  BP: 107/76 119/80  Pulse: 63 89  Resp: 20 18  Temp:  (!) 97 F (36.1 C)  SpO2: 98% 98%     General: Awake, no distress.  CV:  Good peripheral perfusion.  Normal heart sounds.  Regular rate and rhythm. Resp:  Normal effort. Speaking easily and comfortably, no accessory muscle usage nor intercostal retractions.  Lungs are clear to auscultation bilaterally. Abd:  No distention.  No tenderness to palpation. Other:  Normal mood and affect, cheerful, pleasant, conversational, denies any current pain.   ED Results / Procedures /  Treatments   Labs (all labs ordered are listed, but only abnormal results are displayed) Labs Reviewed  BASIC METABOLIC PANEL - Abnormal; Notable for the following components:      Result Value   Glucose, Bld 103 (*)    All other components within normal limits  CBC - Abnormal; Notable for the following components:   WBC 10.6 (*)    RBC 5.46 (*)    MCV 74.9 (*)    MCH 23.1 (*)    Platelets 438 (*)    All other components within normal limits  POC URINE PREG, ED  TROPONIN I (HIGH SENSITIVITY)  TROPONIN I (HIGH SENSITIVITY)     EKG  ED ECG REPORT I, Hinda Kehr, the attending physician, personally viewed and interpreted this ECG.  Date: 01/16/2023 EKG Time: 21: 42 Rate: 83 Rhythm: normal sinus rhythm QRS Axis: normal Intervals: normal ST/T Wave abnormalities: normal Narrative Interpretation: no evidence of acute ischemia    RADIOLOGY I viewed and interpreted the patient's two-view chest x-ray.  I see no evidence of  pneumonia or other emergent abnormality.  I also read the radiologist's report, which confirmed no acute findings.    PROCEDURES:  Critical Care performed: No  Procedures   MEDICATIONS ORDERED IN ED: Medications - No data to display   IMPRESSION / MDM / [place] / ED COURSE  I reviewed the triage vital signs and the nursing notes.                              Differential diagnosis includes, but is not limited to, volume depletion, viral illness, less likely cardiac arrhythmia or acute infectious process.  Patient's presentation is most consistent with acute presentation with potential threat to life or bodily function.  Labs/studies ordered: EKG, two-view chest x-ray, CBC, basic metabolic panel, high-sensitivity troponin, urine pregnancy test [facility] Course my include additional interventions not listed in this section:)  Labs are all reassuring and within normal limits.  Patient has been asymptomatic for 6+ hours.  No  concerning findings and I had my usual hypertension discussion with the patient.  Her blood pressure returned to normal (119/80), and I believe that she was hypertensive at work either because of erroneous measurement or because of the situation and her concern over whether or not she was acutely ill.  She feels much better and is comfortable with the plan for discharge and outpatient follow-up.  I gave my usual return precautions.  The patient was on the cardiac monitor to evaluate for evidence of arrhythmia and/or significant heart rate changes.       FINAL CLINICAL IMPRESSION(S) / ED DIAGNOSES   Final diagnoses:  Near syncope     Rx / DC Orders   ED Discharge Orders          Ordered    ondansetron (ZOFRAN-ODT) 4 MG disintegrating tablet        01/17/23 0406             Note:  This document was prepared using Dragon voice recognition software and may include unintentional dictation errors.   Hinda Kehr, MD 01/17/23 [phone]

## 2023-01-18 ENCOUNTER — Telehealth: Payer: Self-pay | Admitting: *Deleted

## 2023-01-18 ENCOUNTER — Encounter: Payer: Self-pay | Admitting: Internal Medicine

## 2023-01-18 ENCOUNTER — Other Ambulatory Visit: Payer: Self-pay

## 2023-01-18 ENCOUNTER — Ambulatory Visit (INDEPENDENT_AMBULATORY_CARE_PROVIDER_SITE_OTHER): Payer: Medicaid Other | Admitting: Internal Medicine

## 2023-01-18 VITALS — BP 152/91 | HR 80 | Temp 97.6°F | Ht 63.0 in | Wt 251.6 lb

## 2023-01-18 DIAGNOSIS — F32A Depression, unspecified: Secondary | ICD-10-CM

## 2023-01-18 DIAGNOSIS — F320 Major depressive disorder, single episode, mild: Secondary | ICD-10-CM

## 2023-01-18 DIAGNOSIS — G43109 Migraine with aura, not intractable, without status migrainosus: Secondary | ICD-10-CM | POA: Diagnosis not present

## 2023-01-18 DIAGNOSIS — G43809 Other migraine, not intractable, without status migrainosus: Secondary | ICD-10-CM

## 2023-01-18 DIAGNOSIS — F419 Anxiety disorder, unspecified: Secondary | ICD-10-CM

## 2023-01-18 DIAGNOSIS — Z0000 Encounter for general adult medical examination without abnormal findings: Secondary | ICD-10-CM

## 2023-01-18 DIAGNOSIS — E559 Vitamin D deficiency, unspecified: Secondary | ICD-10-CM | POA: Diagnosis not present

## 2023-01-18 MED ORDER — ESCITALOPRAM OXALATE 20 MG PO TABS: 20.0000 mg | ORAL_TABLET | Freq: Every day | ORAL | 0 refills | Status: DC

## 2023-01-18 MED ORDER — TOPIRAMATE 25 MG PO TABS: 25.0000 mg | ORAL_TABLET | ORAL | 0 refills | Status: DC

## 2023-01-18 NOTE — Progress Notes (Unsigned)
   CC: follow up  HPI:  [name] is a 22 y.o. with medical history of migraines, GAD, asthma presenting to [facility] for a follow up.   Please see problem-based list for further details, assessments, and plans.  Past Medical History:  Diagnosis Date   Allergy    Asthma    past hx   Family history of brain aneurysm- father died around age 71  05-03-20   High risk heterosexual behavior May 03, 2020   History of anemia 03-May-2020   Migraine    Pelvic pain 2020-05-03   Right lower quadrant pain 2020/05/03      Current Outpatient Medications (Respiratory):    ipratropium (ATROVENT) 0.06 % nasal spray, Place 2 sprays into both nostrils 4 (four) times daily.  Current Outpatient Medications (Analgesics):    diclofenac (VOLTAREN) 75 MG EC tablet, Take 1 tablet (75 mg total) by mouth 2 (two) times daily after a meal.   Current Outpatient Medications (Other):    Cholecalciferol (VITAMIN D) 50 MCG (2000 UT) tablet, Take 1 tablet (2,000 Units total) by mouth daily.   escitalopram (LEXAPRO) 10 MG tablet, Take 1 tablet (10 mg total) by mouth daily.   hydrOXYzine (ATARAX) 25 MG tablet, Take 1 tablet (25 mg total) by mouth 3 (three) times daily as needed.   meclizine (ANTIVERT) 12.5 MG tablet, Take 1 tablet (12.5 mg total) by mouth 3 (three) times daily as needed for dizziness.   Multiple Vitamins-Minerals (MULTIVITAMIN WITH MINERALS) tablet, Take 1 tablet by mouth daily.   ondansetron (ZOFRAN-ODT) 4 MG disintegrating tablet, Allow 1-2 tablets to dissolve in your mouth every 8 hours as needed for nausea/vomiting   triamcinolone ointment (KENALOG) 0.5 %, Apply 1 Application topically 2 (two) times daily. Apply to areas of rash.  Review of Systems:  Review of system negative unless stated in the problem list or HPI.    Physical Exam:  Vitals:   01/18/23 0904  BP: (!) 152/91  Pulse: 80  Temp: 97.6 F (36.4 C)  TempSrc: Oral  SpO2: 96%  Weight: 251 lb 9.6 oz (114.1 kg)  Height: 5\' 3"  (1.6  m)    Physical Exam General: NAD HENT: NCAT Lungs: CTAB, no wheeze, rhonchi or rales.  Cardiovascular: Normal heart sounds, no r/m/g, 2+ pulses in all extremities. No LE edema Abdomen: No TTP, normal bowel sounds MSK: No asymmetry or muscle atrophy.  Skin: no lesions noted on exposed skin Neuro: Alert and oriented x4. CN grossly intact Psych: Normal mood and normal affect   Assessment & Plan:   No problem-specific Assessment & Plan notes found for this encounter.   See Encounters Tab for problem based charting.  Patient discussed with Dr. {NAMES:[id number]::"Guilloud","Hoffman","Mullen","Narendra","Vincent","Machen","Lau","Hatcher"} Idamae Schuller, MD [facility]. [facility] Internal Medicine Residency, PGY-2   Near Syncopal episode.  Felt dizzy when bending over.    Anxiety On escitalopram 10 mg qd. Last filled on 10/10/22 for 3 month supply. Plan to increase to 20 mg qd. Add prn anxiolytic.  Was on duloxetine 30 mg qd.  GAD 7 score was

## 2023-01-18 NOTE — Patient Instructions (Signed)
[name], it was a pleasure seeing you today! You endorsed feeling well today. Below are some of the things we talked about this visit. We look forward to seeing you in the follow up appointment!  Today we discussed: Please increase your lexapro to 20 mg daily.  We will start you on topamax for your migraines since you are having so many episodes.  We will follow up with you in 2 weeks.   I have ordered the following labs today:  Lab Orders  No laboratory test(s) ordered today      Referrals ordered today:   Referral Orders  No referral(s) requested today     I have ordered the following medication/changed the following medications:   Stop the following medications: Medications Discontinued During This Encounter  Medication Reason   escitalopram (LEXAPRO) 10 MG tablet Reorder     Start the following medications: Meds ordered this encounter  Medications   escitalopram (LEXAPRO) 20 MG tablet    Sig: Take 1 tablet (20 mg total) by mouth daily.    Dispense:  30 tablet    Refill:  0   topiramate (TOPAMAX) 25 MG tablet    Sig: Take 1 tablet (25 mg total) by mouth See admin instructions for 28 days. 25 mg orally once daily in the evening for first week, 25 mg twice daily for second week, 25 mg in the morning and 50 mg in the evening for third week, and then 50 mg twice daily    Dispense:  49 tablet    Refill:  0     Follow-up: 2 weeks with Dr. Humphrey Rolls  Please make sure to arrive 15 minutes prior to your next appointment. If you arrive late, you may be asked to reschedule.   We look forward to seeing you next time. Please call our clinic at [phone] if you have any questions or concerns. The best time to call is Monday-Friday from 9am-4pm, but there is someone available 24/7. If after hours or the weekend, call the main hospital number and ask for the Internal Medicine Resident On-Call. If you need medication refills, please notify your pharmacy one week in advance and  they will send [place] a request.  Thank you for letting us take part in your care. Wishing you the best!  Thank you, Idamae Schuller, MD

## 2023-01-19 ENCOUNTER — Encounter: Payer: Self-pay | Admitting: Internal Medicine

## 2023-01-19 ENCOUNTER — Telehealth: Payer: Self-pay

## 2023-01-19 ENCOUNTER — Other Ambulatory Visit: Payer: Self-pay | Admitting: Internal Medicine

## 2023-01-19 MED ORDER — NURTEC 75 MG PO TBDP: 75.0000 mg | ORAL_TABLET | Freq: Every day | ORAL | 0 refills | Status: DC | PRN

## 2023-01-19 NOTE — Telephone Encounter (Signed)
Decision:Denied Catherine [name] (Key: [id number]) PA Case ID #: [id number] Rx #: [id number] Need Help? Call us at [phone] Outcome Denied today Denied. Per the health plan preferred drug list, at least 2 preferred drugs must be tried before requesting this drug or tell us why the member cannot try any preferred alternatives. Please send [place] supporting chart notes and lab results. Here is list of preferred alternatives: Aimovig Autoinjector, Ajovy Autoinjector / Syringe, Emgality Pen / Syringe. Note: Some preferred drug(s) may have quantity limits. Refer to the health plan's preferred drug list for additional details. Additionally, per the health plan guideline, Migraine Therapy- Calcitonin Gene-Related Peptide Inhibitors, the clinical criteria below must be met before this request can be approved. Please send [place] supporting chart notes and lab results. Beneficiary does not have medication over-use headache [facility]); Beneficiary is utilizing prophylactic intervention modalities (e.g. behavioral therapy, physical therapy, life-style modifications); Beneficiary has tried and failed at least 2 preferred injectable CGRPs; Beneficiary must NOT be concurrently using a strong CYP3A4 inhibitor; Beneficiary must NOT have end-stage renal disease (creatinine clearance [CrCl] < 15 mL/min) Drug Nurtec 75MG dispersible tablets [place] [facility] [place] Electronic Prior Authorization Request Form Original Claim Info 75

## 2023-01-19 NOTE — Assessment & Plan Note (Addendum)
Pt has hx of GAD and is on escitalopram 10 mg qd. Plan to increase to 20 mg qd, pt has prn hydralazine 25 mg which pt has not taken recently. Marland Kitchen She was on duloxetine 30 mg qd which pt did not tolerate. Her symptoms secondary to uncontrolled anxiety. It appears she had positional presyncopal episode which increased her anxiety and given her family hx caused her seek evaluation at the ED. She was seen in the ED for similar symptoms and lab results including EKG and troponin normal ruling out ACS. We will have pt come back in 2 weeks to ensure improvement in her anxiety with the dose increase. Return precautions given.

## 2023-01-19 NOTE — Assessment & Plan Note (Signed)
Will plan to check her vitamin D at next OV.

## 2023-01-19 NOTE — Assessment & Plan Note (Signed)
Pt has hx of migraines and reports >14 days/month with symptoms. Her symptoms includes unilateral headache, nausea, and light sensitivity. She states she gets vertigo as well. She was prescribed Nurtec but states this was too expensive for her. Given the frequency of her migraines, will start her on topamax and uptitrate gradually for migraine prophylaxis. I prescribed her Nurtec for acute attacks after speaking with pharmacy but it appears her insurance will not cover it. If pt continues to have symptoms, we can consider Nurtec given failure of topamax.

## 2023-01-19 NOTE — Telephone Encounter (Signed)
Prior Authorization for patient (Nurtec) came through on cover my meds was submitted with last office notes awaiting approval or denail

## 2023-01-19 NOTE — Assessment & Plan Note (Signed)
Will address these at 2 week follow up visit.

## 2023-01-23 NOTE — Progress Notes (Signed)
Internal Medicine Clinic Attending  Case discussed with Dr. Humphrey Rolls  At the time of the visit.  We reviewed the resident's history and exam and pertinent patient test results.  I agree with the assessment, diagnosis, and plan of care documented in the resident's note. As a correction, patient has prn hydroxyzine (not hydralazine) for anxiety symptoms.

## 2023-01-23 NOTE — Addendum Note (Signed)
Addended by: Charise Killian on: 01/23/2023 02:42 PM   Modules accepted: Level of Service

## 2023-02-02 ENCOUNTER — Encounter: Payer: Self-pay | Admitting: Internal Medicine

## 2023-02-02 ENCOUNTER — Ambulatory Visit (INDEPENDENT_AMBULATORY_CARE_PROVIDER_SITE_OTHER): Payer: Medicaid Other | Admitting: Internal Medicine

## 2023-02-02 ENCOUNTER — Other Ambulatory Visit (HOSPITAL_COMMUNITY)
Admission: RE | Admit: 2023-02-02 | Discharge: 2023-02-02 | Disposition: A | Discharge status: Home / Self Care | Payer: Medicaid Other | Source: Ambulatory Visit | Attending: Internal Medicine | Admitting: Internal Medicine

## 2023-02-02 VITALS — BP 121/76 | HR 58 | Temp 98.2°F | Ht 63.0 in | Wt 255.0 lb

## 2023-02-02 DIAGNOSIS — F320 Major depressive disorder, single episode, mild: Secondary | ICD-10-CM

## 2023-02-02 DIAGNOSIS — Z124 Encounter for screening for malignant neoplasm of cervix: Secondary | ICD-10-CM

## 2023-02-02 DIAGNOSIS — F419 Anxiety disorder, unspecified: Secondary | ICD-10-CM

## 2023-02-02 DIAGNOSIS — G43109 Migraine with aura, not intractable, without status migrainosus: Secondary | ICD-10-CM

## 2023-02-02 DIAGNOSIS — Z118 Encounter for screening for other infectious and parasitic diseases: Secondary | ICD-10-CM

## 2023-02-02 DIAGNOSIS — Z0000 Encounter for general adult medical examination without abnormal findings: Secondary | ICD-10-CM

## 2023-02-02 MED ORDER — HYDROXYZINE HCL 25 MG PO TABS: 25.0000 mg | ORAL_TABLET | Freq: Three times a day (TID) | ORAL | 2 refills | Status: DC | PRN

## 2023-02-02 NOTE — Patient Instructions (Addendum)
[name], it was a pleasure seeing you today! You endorsed feeling well today. Below are some of the things we talked about this visit. We look forward to seeing you in the follow up appointment!  Today we discussed: Please continue taking your medications.  We performed a pap smear and we will check your urine.  I have ordered the following labs today:  Lab Orders  No laboratory test(s) ordered today      Referrals ordered today:   Referral Orders  No referral(s) requested today     I have ordered the following medication/changed the following medications:   Stop the following medications: Medications Discontinued During This Encounter  Medication Reason   hydrOXYzine (ATARAX) 25 MG tablet Reorder   ondansetron (ZOFRAN-ODT) 4 MG disintegrating tablet Patient has not taken in last 30 days   meclizine (ANTIVERT) 12.5 MG tablet Patient has not taken in last 30 days     Start the following medications: Meds ordered this encounter  Medications   hydrOXYzine (ATARAX) 25 MG tablet    Sig: Take 1 tablet (25 mg total) by mouth 3 (three) times daily as needed.    Dispense:  30 tablet    Refill:  2     Follow-up: 1 month follow up   Please make sure to arrive 15 minutes prior to your next appointment. If you arrive late, you may be asked to reschedule.   We look forward to seeing you next time. Please call our clinic at [phone] if you have any questions or concerns. The best time to call is Monday-Friday from 9am-4pm, but there is someone available 24/7. If after hours or the weekend, call the main hospital number and ask for the Internal Medicine Resident On-Call. If you need medication refills, please notify your pharmacy one week in advance and they will send [place] a request.  Thank you for letting us take part in your care. Wishing you the best!  Thank you, Idamae Schuller, MD

## 2023-02-02 NOTE — Progress Notes (Signed)
CC: 2 week follow up  HPI:  Catherine [name] is a 22 y.o. with medical history of migraines, GAD, asthma presenting to [facility] for a 2 week follow up.     Please see problem-based list for further details, assessments, and plans.  Past Medical History:  Diagnosis Date   Allergy    Asthma    past hx   Blanching rash 10/19/2022   Family history of brain aneurysm- father died around age 63  2020-04-28   High risk heterosexual behavior 04/28/2020   History of anemia 2020-04-28   Migraine    Pelvic pain 04-28-2020   Polyuria 28-Apr-2020   Right lower quadrant pain Apr 28, 2020      Current Outpatient Medications (Respiratory):    ipratropium (ATROVENT) 0.06 % nasal spray, Place 2 sprays into both nostrils 4 (four) times daily.  Current Outpatient Medications (Analgesics):    diclofenac (VOLTAREN) 75 MG EC tablet, Take 1 tablet (75 mg total) by mouth 2 (two) times daily after a meal.   Current Outpatient Medications (Other):    Cholecalciferol (VITAMIN D) 50 MCG (2000 UT) tablet, Take 1 tablet (2,000 Units total) by mouth daily.   escitalopram (LEXAPRO) 20 MG tablet, Take 1 tablet (20 mg total) by mouth daily.   hydrOXYzine (ATARAX) 25 MG tablet, Take 1 tablet (25 mg total) by mouth 3 (three) times daily as needed.   Multiple Vitamins-Minerals (MULTIVITAMIN WITH MINERALS) tablet, Take 1 tablet by mouth daily.   topiramate (TOPAMAX) 25 MG tablet, Take 1 tablet (25 mg total) by mouth See admin instructions for 28 days. 25 mg orally once daily in the evening for first week, 25 mg twice daily for second week, 25 mg in the morning and 50 mg in the evening for third week, and then 50 mg twice daily   triamcinolone ointment (KENALOG) 0.5 %, Apply 1 Application topically 2 (two) times daily. Apply to areas of rash.  Review of Systems:  Review of system negative unless stated in the problem list or HPI.    Physical Exam:  Vitals:   02/02/23 1318  BP: 121/76  Pulse: (!) 58  Temp: 98.2 F  (36.8 C)  TempSrc: Oral  SpO2: 100%  Weight: 255 lb (115.7 kg)  Height: '5\' 3"'$  (1.6 m)    Physical Exam General: NAD HENT: NCAT Lungs: CTAB, no wheeze, rhonchi or rales.  Cardiovascular: Normal heart sounds, no r/m/g, 2+ pulses in all extremities. No LE edema Abdomen: No TTP, normal bowel sounds GU: Normal external genitalia, vaginal canal without any abnormality, cervix visualized without erythema.   MSK: No asymmetry or muscle atrophy.  Skin: no lesions noted on exposed skin Neuro: Alert and oriented x4. CN grossly intact Psych: Normal mood and normal affect   Assessment & Plan:   Anxiety and depression Pt with GAD who previously uncontrolled anxiety and was on lexapro 10 mg qd. It was increased to 20 mg qd. Pt notes significant improvement in her anxiety and states her mood is significantly improved.   Migraine with aura and without status migrainosus, not intractable Pt with hx of migraines. Was taking prn medications previously. She was started on topamax and currently on 25 mg BID and is gradually increasing to max dosage. She notes significant improvement in her migraines and states her migraines have improved from around 8 attacks per week to now 3 attacks over the last 2 weeks. Advised pt to increase it gradually to 50 mg BID and if pt still having breakthrough migraines, can  consider adding prn medication. Will continue with current regimen.   Health care maintenance Pap smear done this visit. 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  Chylamida testing done this visit.  Will get records for pts HPV vaccine.    See Encounters Tab for problem based charting.  Patient Discussed with Dr. Roderic Ovens, MD [facility]. [facility] Internal Medicine Residency, PGY-2

## 2023-02-05 NOTE — Assessment & Plan Note (Signed)
Pt with GAD who previously uncontrolled anxiety and was on lexapro 10 mg qd. It was increased to 20 mg qd. Pt notes significant improvement in her anxiety and states her mood is significantly improved.

## 2023-02-05 NOTE — Assessment & Plan Note (Signed)
Pap smear done this visit. 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  Chylamida testing done this visit.  Will get records for pts HPV vaccine.

## 2023-02-05 NOTE — Assessment & Plan Note (Signed)
Pt with hx of migraines. Was taking prn medications previously. She was started on topamax and currently on 25 mg BID and is gradually increasing to max dosage. She notes significant improvement in her migraines and states her migraines have improved from around 8 attacks per week to now 3 attacks over the last 2 weeks. Advised pt to increase it gradually to 50 mg BID and if pt still having breakthrough migraines, can consider adding prn medication. Will continue with current regimen.

## 2023-02-06 LAB — CYTOLOGY - PAP
Chlamydia: NEGATIVE
Comment: NEGATIVE
Comment: NEGATIVE
Diagnosis: NEGATIVE
High risk HPV: NEGATIVE

## 2023-02-08 NOTE — Progress Notes (Signed)
Internal Medicine Clinic Attending  Case discussed with the resident at the time of the visit.  We reviewed the resident's history and exam and pertinent patient test results.  I agree with the assessment, diagnosis, and plan of care documented in the resident's note.  

## 2023-02-21 NOTE — Progress Notes (Deleted)
Referring Physician:  Idamae Schuller, MD [address] [place],  [place] [address]  Primary Physician:  Idamae Schuller, MD  History of Present Illness: [name] was last seen by me for a phone visit on 01/05/23. She has known disc bulging at L4-L5 asymmetric to left with moderate right/severe left lateral recess stenosis and mild left foraminal stenosis.   Her right sided LBP and right posterior leg pain improved and she was having more constant left sided LBP with left posterior leg pain to her foot.   She was to continue HEP from PT and was given voltaren. She was to see Dr. Alba Destine to discuss possible lumbar injections (not done).   She is here for follow up.          on 10/06/22 for constant right sided LBP with right posterior leg pain sometimes to her knee and sometimes to her foot.   Previous CT of abdomen/pelvis showed L4-5 left paracentral herniated disc with mass effect on the left L5 nerve root.   MRI of lumbar spine ordered at last visit and she was to start PT (has done 4 visits).   She is here to review her lumbar MRI results.   She thinks she is seeing some relief with PT. She has some increased soreness after PT, but has this improves when she does her stretches at home. She continues with constant right sided LBP with right posterior leg pain sometimes to her knee and sometimes to her foot with intermittent tingling. It is still hard to get up after prolonged sitting due to pain.   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   Conservative measures:  Physical therapy: initial eval on 10/06/22, has done 4 visits total.  Multimodal medical therapy including regular antiinflammatories: tylenol, motrin, naproxen, voltaren  Injections: no epidural steroid injections  Past Surgery: no spinal surgery  NICHEL BETHELL has no symptoms of cervical myelopathy.  The symptoms are causing a significant impact on the patient's life.   Review of Systems:  A 10 point review of  systems is negative, except for the pertinent positives and negatives detailed in the HPI.  Past Medical History: Past Medical History:  Diagnosis Date   Allergy    Asthma    past hx   Blanching rash 10/19/2022   Family history of brain aneurysm- father died around age 9  04-12-2020   High risk heterosexual behavior 04/12/20   History of anemia 04/12/20   Migraine    Pelvic pain 04-12-2020   Polyuria 04-12-2020   Right lower quadrant pain Apr 12, 2020    Past Surgical History: Past Surgical History:  Procedure Laterality Date   TONSILLECTOMY Bilateral 08/11/2015   Procedure: CONTROL POST TONSILLECTOMY BLEEDING ;  Surgeon: Margaretha Sheffield, MD;  Location: [facility];  Service: ENT;  Laterality: Bilateral;   TONSILLECTOMY AND ADENOIDECTOMY N/A 07/31/2015   Procedure: TONSILLECTOMY AND ADENOIDECTOMY;  Surgeon: Beverly Gust, MD;  Location: [place];  Service: ENT;  Laterality: N/A;    Allergies: Allergies as of 02/23/2023 - Review Complete 01/16/2023  Allergen Reaction Noted   Amoxicillin Hives 06/05/2020    Medications: Outpatient Encounter Medications as of 02/23/2023  Medication Sig   Cholecalciferol (VITAMIN D) 50 MCG (2000 UT) tablet Take 1 tablet (2,000 Units total) by mouth daily.   diclofenac (VOLTAREN) 75 MG EC tablet Take 1 tablet (75 mg total) by mouth 2 (two) times daily after a meal.   escitalopram (LEXAPRO) 20 MG tablet Take 1 tablet (20  mg total) by mouth daily.   hydrOXYzine (ATARAX) 25 MG tablet Take 1 tablet (25 mg total) by mouth 3 (three) times daily as needed.   ipratropium (ATROVENT) 0.06 % nasal spray Place 2 sprays into both nostrils 4 (four) times daily.   Multiple Vitamins-Minerals (MULTIVITAMIN WITH MINERALS) tablet Take 1 tablet by mouth daily.   topiramate (TOPAMAX) 25 MG tablet Take 1 tablet (25 mg total) by mouth See admin instructions for 28 days. 25 mg orally once daily in the evening for first week, 25 mg twice daily for second week, 25  mg in the morning and 50 mg in the evening for third week, and then 50 mg twice daily   triamcinolone ointment (KENALOG) 0.5 % Apply 1 Application topically 2 (two) times daily. Apply to areas of rash.   No facility-administered encounter medications on file as of 02/23/2023.    Social History: Social History   Tobacco Use   Smoking status: Never    Passive exposure: Never   Smokeless tobacco: Never  Vaping Use   Vaping Use: Never used  Substance Use Topics   Alcohol use: Yes    Comment: once per month, 1-2 drinks   Drug use: Yes    Types: Marijuana    Family Medical History: Family History  Problem Relation Age of Onset   Hypertension Father    Aneurysm Father    Hypertension Brother    Diabetes Brother    Breast cancer Maternal Grandmother    Cancer Maternal Grandmother    Lung disease Maternal Grandmother    Bipolar disorder Mother    Depression Mother    Cancer Mother    Schizophrenia Mother    Aneurysm Paternal Grandfather    Heart attack Maternal Grandfather     Physical Examination: There were no vitals filed for this visit.  Awake, alert, oriented to person, place, and time.  Speech is clear and fluent. Fund of knowledge is appropriate.   Cranial Nerves: Pupils equal round and reactive to light.  Facial tone is symmetric.    No abnormal lesions on exposed skin.   Strength: Side Iliopsoas Quads Hamstring PF DF EHL  R 5 5 5 5 5 5   L 5 5 5 5 5 5    Bilateral lower extremity sensation is intact to light touch.   Gait is normal.    Medical Decision Making  Imaging: none  Assessment and Plan: Ms. Rinehart is a pleasant 22 y.o. female with improvement in right sided LBP and right posterior leg pain. This is mostly gone. She now has more constant left sided LBP with left posterior leg pain to her foot. She has numbness, tingling, and some feelings of weakness in her left leg.    She has known disc bulging at L4-L5 asymmetric to left with moderate  right/severe left lateral recess stenosis and mild left foraminal stenosis. This is likely her pain generator.    Treatment options discussed with patient and following plan made:    - Continue HEP from PT.  - Stop naproxen. New prescription for voltaren sent to pharmacy. Reviewed dosing and side effects. Take with food. Advised to stop if any GI upset. Reviewed risk of GI bleed with NSAIDs and lexapro. Will stop if any issues. She has no issues with naproxen.  - Discussed trial of neurontin. She has taken previously and had side effects. She declines.  - Keep appointment with Dr. Alba Destine on 01/15/23 to discuss possible lumbar injections.  - Follow up with me  in 6-8 weeks for recheck.     I spent a total of 20 minutes in face-to-face and non-face-to-face activities related to this patient's care toda including review of outside records, review of imaging, review of symptoms, physical exam, discussion of differential diagnosis, discussion of treatment options, and documentation.   Geronimo Boot PA-C Dept. of Neurosurgery

## 2023-02-23 ENCOUNTER — Ambulatory Visit: Payer: Self-pay | Admitting: Orthopedic Surgery

## 2023-04-27 ENCOUNTER — Encounter: Payer: Self-pay | Admitting: Student

## 2023-04-27 ENCOUNTER — Ambulatory Visit: Payer: Medicaid Other | Admitting: Student

## 2023-04-27 ENCOUNTER — Other Ambulatory Visit: Payer: Self-pay

## 2023-04-27 VITALS — BP 127/84 | HR 84 | Temp 97.8°F | Ht 63.0 in | Wt 267.5 lb

## 2023-04-27 DIAGNOSIS — G43109 Migraine with aura, not intractable, without status migrainosus: Secondary | ICD-10-CM | POA: Diagnosis not present

## 2023-04-27 DIAGNOSIS — F32A Depression, unspecified: Secondary | ICD-10-CM

## 2023-04-27 DIAGNOSIS — Z6841 Body Mass Index (BMI) 40.0 and over, adult: Secondary | ICD-10-CM | POA: Diagnosis not present

## 2023-04-27 DIAGNOSIS — F320 Major depressive disorder, single episode, mild: Secondary | ICD-10-CM

## 2023-04-27 DIAGNOSIS — F418 Other specified anxiety disorders: Secondary | ICD-10-CM | POA: Diagnosis not present

## 2023-04-27 MED ORDER — PROPRANOLOL HCL 10 MG PO TABS: 10.0000 mg | ORAL_TABLET | Freq: Two times a day (BID) | ORAL | 11 refills | Status: DC | PRN

## 2023-04-27 MED ORDER — PROPRANOLOL HCL 10 MG PO TABS: 10.0000 mg | ORAL_TABLET | Freq: Every day | ORAL | 11 refills | Status: DC | PRN

## 2023-04-27 MED ORDER — ESCITALOPRAM OXALATE 20 MG PO TABS: 20.0000 mg | ORAL_TABLET | Freq: Every day | ORAL | 3 refills | Status: DC

## 2023-04-27 NOTE — Patient Instructions (Addendum)
Thank you, [name] for allowing [place] to provide your care today. Today we discussed   Pick up your Lexapro and take it every day - set alarms for it on your phone Referrals: - Therapy with our internal medicine social worker - telehealth  New medications: - Propanolol 10 mg as you feel panic attacks coming  Recommendations:  For migraines Exercise in moderation Maintain a healthy diet Identify and try to avoid potential triggers Follow a regular sleeping schedule  Your commitments - replacing pretzler or smoothie for protein rich snacks - less swetened drinks in the week - walk on the threadmill for 30 min 2x per wekk (2.5-3 MPH) - Continue moving at work - watch proportion [employer]   04/27/23 0856  Weight: 267 lb 8 oz (121.3 kg)    I have ordered the following labs for you:  Lab Orders  No laboratory test(s) ordered today     I will call if any are abnormal. All of your labs can be accessed through "My Chart".   My Chart Access: [email or web address]  Please follow-up in: 3 weeks    We look forward to seeing you next time. Please call our clinic at [phone] if you have any questions or concerns. The best time to call is Monday-Friday from 9am-4pm, but there is someone available 24/7. If after hours or the weekend, call the main hospital number and ask for the Internal Medicine Resident On-Call. If you need medication refills, please notify your pharmacy one week in advance and they will send [place] a request.   Thank you for letting us take part in your care. Wishing you the best!  Morene Crocker, MD 04/27/2023, 8:31 AM [facility] Internal Medicine Resident, PGY-1    Grounding Techniques  [phone] Grounding Technique: This grounding technique can be utilized when you're feeling stressed or anxious. It can also be useful when you are having trouble falling asleep when your mind is racing.  List 5 things you  can see. Describe any details that stand out.    Ex: I can see my dog sleeping on the rug beside the couch. List 4 things you can touch. Describe how they feel.     Ex: The shirt I'm wearing is very soft. It feels silky to the touch. List 3 things you can hear. Think about how loud or quiet they may be.    Ex: I can hear a car going down the street. It sounds distant. List 2 things you can smell. Does the smell make you think of anything?    Ex: I can smell the lotion on my hands. It reminds me of when I bought it with my sister. List 1 thing you can taste. You can always place a piece of candy in your mouth or take a bite of something. What kind of flavors do you taste?     Ex: I can taste the toothpaste I used to brush my teeth.    CALM Grounding Technique: This grounding technique can be utilized when you are feeling stressed or tense. Prior to starting the grounding exercise, sit comfortably with both feet on the ground. Uncross your arms and hands. Begin by taking 3 deep breaths. Inhale through your noise for 5 seconds, hold it, and exhale through your mouth for 5 seconds.  C- chest. Focus on your chest, your belly, and your back. How are you breathing? Where are you holding tension? Relax those areas. A- arms. Focus on your arms from your  shoulders to your fingertips. Where are you holding tension? Relax those areas. Ball up your fists and then let it go.  L- legs. Focus on your legs from your hips down to your toes. Where are you holding tension? Relax those areas. Ball up your toes and then let it go. M- mouth. Focus on your head, your jaw, your mouth, your neck. Where are you holding tension? Relax and let it go. End by taking 3 deep breaths. Inhale through your noise for 5 seconds, hold it, and exhale through your mouth for 5 seconds.   Using Your Feet Grounding Technique: This technique can be utilized when you are feeling anxious. Imagine your big toe as a paintbrush. Take one foot  and trace the outline of your other foot. Start at the heel and using your imaginary paint brush go all the way around your foot. Slowly go up the side of your foot. Trace each toe. Return to the heel. Repeat on the other foot. Tense one foot. Relax the same foot. Notice the tension release. Repeat with the other foot. Wiggle your toes. Notice how they move. Do any toes move independently of the others or are they all connected?   Colors Grounding Technique: This grounding technique can be utilized when you are feeling anxious or stressed. Choose a color. Look around your room and point out anything that is that color. When you've identified all the items, select another color and repeat.  Counting Grounding Technique: This grounding technique can be useful when you are feeling anxious or when you are having trouble falling asleep. Start at 100 and count backwards by 7. This isn't easy and forces your mind to focus on counting rather than letting your mind race.  PB&J Grounding Technique: This grounding technique can be useful when you are feeling anxious or stressed. Go through step by step how to make a peanut butter and jelly sandwich. Be very specific.  Ex: First I need to go to the kitchen. I will need to open the cupboard so that I can get out the bread, the peanut butter and jelly. Then I will shut the cupboard. Next I need a knife. I will open the drawer and pull out a knife. I have to open the bread. I will take out two slices. Not an end piece. I don't like the end pieces. Then I will close the bag.. You can do this with any ordinary task. Be as specific as possible. Try not to leave out any steps. Some examples of tasks you can describe are: Making your bed in the morning, Driving to work/school/a family member's house. (Pick a route you're very familiar with), Getting a cup of water, Your bedtime routine, Getting dressed  Categories Grounding Technique: This grounding technique can be  useful when you are feeling anxious or stressed. Choose a broad category and go through naming as many items within that category as you can. When you run out of items, pick a new category and repeat the process. Examples of categories include: Movies, Countries, Books, Sports teams, Animals, Fruits, Vegetables, Names that start with "S"'

## 2023-04-27 NOTE — Assessment & Plan Note (Signed)
Patient has not been taking migraine medication  prescribed last but resports that the migraine headaches have been sporadic. <2 per month. When she has a headache she lays down, tries to fall asleep and usually feels a lot better. She has been working on sleep hygiene as she has noticed pattern of migraine headaches after sleep disturbances.Has not seen patterns related to food, menstrual periods, physical activity, light. She is having <2 attacks per month now. Has tried sumatriptan in the past; but would like to try conservative management for now. Would be willing to re-start Topiramate if her episodes continue. - Continue monitoring - Reviewed and provided information about lifestyle modifications

## 2023-04-27 NOTE — Assessment & Plan Note (Addendum)
Last A1c:  Lab Results  Component Value Date   HGBA1C 5.7 (H) 09/22/2022    Discussed with patient lifestyle modifications given migraine and depression comorbidities and increased risk of cardiovascular disease. Denies polydipsia, polyuria. No acanthosis nigricans noted on exam.  Patient committed to the following lidestyle modifications until next visit:  - Replacing pretzels or fruit smoothie for protein rich snacks like yogurt or protein shakes she likes - Decrease swetened/sugary drinks in the week by 2 - Walk on the threadmill for 30 min 2x per week (2.5-3 MPH) - Monitor portion sizes

## 2023-04-27 NOTE — Progress Notes (Signed)
Subjective:  CC: Migraines and anxiety  HPI:  [name] is a 22 y.o. female with a past medical history stated below and presents today for  Migraines and anxiety. Please see problem based assessment and plan for additional details.  Past Medical History:  Diagnosis Date   Allergy    Asthma    past hx   Blanching rash 10/19/2022   Family history of brain aneurysm- father died around age 56  2020-04-23   High risk heterosexual behavior Apr 23, 2020   History of anemia 04/23/2020   Migraine    Pelvic pain 04-23-2020   Polyuria Apr 23, 2020   Right lower quadrant pain 04-23-20    Current Outpatient Medications on File Prior to Visit  Medication Sig Dispense Refill   Cholecalciferol (VITAMIN D) 50 MCG (2000 UT) tablet Take 1 tablet (2,000 Units total) by mouth daily. 90 tablet 1   diclofenac (VOLTAREN) 75 MG EC tablet Take 1 tablet (75 mg total) by mouth 2 (two) times daily after a meal. 60 tablet 2   hydrOXYzine (ATARAX) 25 MG tablet Take 1 tablet (25 mg total) by mouth 3 (three) times daily as needed. 30 tablet 2   ipratropium (ATROVENT) 0.06 % nasal spray Place 2 sprays into both nostrils 4 (four) times daily. 15 mL 12   Multiple Vitamins-Minerals (MULTIVITAMIN WITH MINERALS) tablet Take 1 tablet by mouth daily. 100 tablet 0   topiramate (TOPAMAX) 25 MG tablet Take 1 tablet (25 mg total) by mouth See admin instructions for 28 days. 25 mg orally once daily in the evening for first week, 25 mg twice daily for second week, 25 mg in the morning and 50 mg in the evening for third week, and then 50 mg twice daily 49 tablet 0   triamcinolone ointment (KENALOG) 0.5 % Apply 1 Application topically 2 (two) times daily. Apply to areas of rash. 30 g 1   No current facility-administered medications on file prior to visit.    Family History  Problem Relation Age of Onset   Hypertension Father    Aneurysm Father    Hypertension Brother    Diabetes Brother    Breast cancer Maternal  Grandmother    Cancer Maternal Grandmother    Lung disease Maternal Grandmother    Bipolar disorder Mother    Depression Mother    Cancer Mother    Schizophrenia Mother    Aneurysm Paternal Grandfather    Heart attack Maternal Grandfather     Social History   Socioeconomic History   Marital status: Single    Spouse name: Not on file   Number of children: Not on file   Years of education: Not on file   Highest education level: Not on file  Occupational History   Not on file  Tobacco Use   Smoking status: Never    Passive exposure: Never   Smokeless tobacco: Never  Vaping Use   Vaping Use: Never used  Substance and Sexual Activity   Alcohol use: Yes    Comment: once per month, 1-2 drinks   Drug use: Yes    Types: Marijuana   Sexual activity: Yes    Birth control/protection: None  Other Topics Concern   Not on file  Social History Narrative   Not on file   Social Determinants of Health   Financial Resource Strain: Not on file  Food Insecurity: No Food Insecurity (01/18/2023)   Hunger Vital Sign    Worried About Running Out of Food in the  Last Year: Never true    Ran Out of Food in the Last Year: Never true  Transportation Needs: No Transportation Needs (01/18/2023)   PRAPARE - Administrator, Civil Service (Medical): No    Lack of Transportation (Non-Medical): No  Physical Activity: Not on file  Stress: Not on file  Social Connections: Moderately Integrated (01/18/2023)   Social Connection and Isolation Panel [NHANES]    Frequency of Communication with Friends and Family: More than three times a week    Frequency of Social Gatherings with Friends and Family: More than three times a week    Attends Religious Services: More than 4 times per year    Active Member of [employer] or Organizations: No    Attends Banker Meetings: Never    Marital Status: Living with partner  Intimate Partner Violence: Not At Risk (01/18/2023)   Humiliation, Afraid,  Rape, and Kick questionnaire    Fear of Current or Ex-Partner: No    Emotionally Abused: No    Physically Abused: No    Sexually Abused: No    Review of Systems: ROS negative except for what is noted on the assessment and plan.  Objective:   Vitals:   04/27/23 0856  BP: 127/84  Pulse: 84  Temp: 97.8 F (36.6 C)  TempSrc: Oral  SpO2: 100%  Weight: 267 lb 8 oz (121.3 kg)  Height: 5\' 3"  (1.6 m)    Physical Exam: Constitutional: well-appearing woman sitting in chair, in no acute distress HENT: normocephalic atraumatic, mucous membranes moist Eyes: conjunctiva non-erythematous Neck: supple.  Cardiovascular: regular rate and rhythm, no m/r/g Pulmonary/Chest: normal work of breathing on room air, lungs clear to auscultation bilaterally Abdominal: soft, non-tender, non-distended MSK: normal bulk and tone Neurological: alert & oriented x 3,  Skin: warm and dry Psych: anxious mood and affect       04/27/2023   11:13 AM  Depression screen PHQ 2/9  Decreased Interest 1  Down, Depressed, Hopeless 1  PHQ - 2 Score 2  Altered sleeping 1  Tired, decreased energy 3  Change in appetite 1  Feeling bad or failure about yourself  0  Trouble concentrating 1  Moving slowly or fidgety/restless 2  Suicidal thoughts 0  PHQ-9 Score 10  Difficult doing work/chores Very difficult       04/27/2023   11:11 AM 06/09/2022    4:20 PM  GAD 7 : Generalized Anxiety Score  Nervous, Anxious, on Edge 3 2  Control/stop worrying 3 2  Worry too much - different things 3 1  Trouble relaxing 3 3  Restless 2 3  Easily annoyed or irritable 3 3  Afraid - awful might happen 3 3  Total GAD 7 Score 20 17  Anxiety Difficulty Very difficult Very difficult     Assessment & Plan:   Migraine with aura and without status migrainosus, not intractable Patient has not been taking migraine medication  prescribed last but resports that the migraine headaches have been sporadic. <2 per month. When she has a  headache she lays down, tries to fall asleep and usually feels a lot better. She has been working on sleep hygiene as she has noticed pattern of migraine headaches after sleep disturbances.Has not seen patterns related to food, menstrual periods, physical activity, light. She is having <2 attacks per month now. Has tried sumatriptan in the past; but would like to try conservative management for now. Would be willing to re-start Topiramate if her episodes continue. -  Continue monitoring - Reviewed and provided information about lifestyle modifications  Anxiety with depression    04/27/2023   11:11 AM 06/09/2022    4:20 PM  GAD 7 : Generalized Anxiety Score  Nervous, Anxious, on Edge 3 2  Control/stop worrying 3 2  Worry too much - different things 3 1  Trouble relaxing 3 3  Restless 2 3  Easily annoyed or irritable 3 3  Afraid - awful might happen 3 3  Total GAD 7 Score 20 17  Anxiety Difficulty Very difficult Very difficult       04/27/2023   11:13 AM  Depression screen PHQ 2/9  Decreased Interest 1  Down, Depressed, Hopeless 1  PHQ - 2 Score 2  Altered sleeping 1  Tired, decreased energy 3  Change in appetite 1  Feeling bad or failure about yourself  0  Trouble concentrating 1  Moving slowly or fidgety/restless 2  Suicidal thoughts 0  PHQ-9 Score 10  Difficult doing work/chores Very difficult     Patient returns to clinic with worsening anxiety. She reports that was on Lexapro 10 mg and was increased during her 01/2023 to 20 mg daily. However, after she ran out of the 30 day supply, she had difficulty calling for a refill. As such, she was not able to come in to the clinic and get this concern addressed. 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 She has began experiences ~ 1 panic attacks per week, often at work or when alone at the house. She attempts breathing exercises but  sometimes cannot relax when this happens.   Discussed re-starting medication with Lexapro 20 mg and importance of consistent use. Will refill 90 day supply as patient has anxiety making phone calls for refills. Will also prescribe 10 mg PRN propanolol for instances when she feels an anxiety episode is scalating before it becomes a panic attack. Patient is also amenable to starting CBT with our [facility] social worker, Christen Butter. - Lexapro 20 mg daily - Propanol 10 mg daily - Referral for integrated behavioral services   No follow-ups on file.  Patient discussed with Dr. Rosalia Hammers, MD [facility] Internal Medicine Program - PGY-1 04/27/2023, 11:20 AM

## 2023-04-27 NOTE — Assessment & Plan Note (Addendum)
    04/27/2023   11:11 AM 06/09/2022    4:20 PM  GAD 7 : Generalized Anxiety Score  Nervous, Anxious, on Edge 3 2  Control/stop worrying 3 2  Worry too much - different things 3 1  Trouble relaxing 3 3  Restless 2 3  Easily annoyed or irritable 3 3  Afraid - awful might happen 3 3  Total GAD 7 Score 20 17  Anxiety Difficulty Very difficult Very difficult       04/27/2023   11:13 AM  Depression screen PHQ 2/9  Decreased Interest 1  Down, Depressed, Hopeless 1  PHQ - 2 Score 2  Altered sleeping 1  Tired, decreased energy 3  Change in appetite 1  Feeling bad or failure about yourself  0  Trouble concentrating 1  Moving slowly or fidgety/restless 2  Suicidal thoughts 0  PHQ-9 Score 10  Difficult doing work/chores Very difficult     Patient returns to clinic with worsening anxiety. She reports that was on Lexapro 10 mg and was increased during her 01/2023 to 20 mg daily. However, after she ran out of the 30 day supply, she had difficulty calling for a refill. As such, she was not able to come in to the clinic and get this concern addressed. 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 She has began experiences ~ 1 panic attacks per week, often at work or when alone at the house. She attempts breathing exercises but sometimes cannot relax when this happens.   Discussed re-starting medication with Lexapro 20 mg and importance of consistent use. Will refill 90 day supply as patient has anxiety making phone calls for refills. Will also prescribe 10 mg PRN propanolol for instances when she feels an anxiety episode is scalating before it becomes a panic attack. Patient is also amenable to starting CBT with our [facility] social worker, Christen Butter. - Lexapro 20 mg daily - Propanol 10 mg daily - Referral for integrated behavioral services - Patient is not currently using contraceptive methods. Encouraged  barrier protection given medication above. Will discuss contraception at next visit

## 2023-04-28 NOTE — Progress Notes (Signed)
Internal Medicine Clinic Attending  Case discussed with Dr. Gomez-Caraballo  At the time of the visit.  We reviewed the resident's history and exam and pertinent patient test results.  I agree with the assessment, diagnosis, and plan of care documented in the resident's note.  

## 2023-05-18 ENCOUNTER — Encounter: Payer: Medicaid Other | Admitting: Student

## 2023-05-25 ENCOUNTER — Ambulatory Visit (INDEPENDENT_AMBULATORY_CARE_PROVIDER_SITE_OTHER): Payer: Medicaid Other | Admitting: Licensed Clinical Social Worker

## 2023-05-25 DIAGNOSIS — F32A Depression, unspecified: Secondary | ICD-10-CM

## 2023-05-25 DIAGNOSIS — F419 Anxiety disorder, unspecified: Secondary | ICD-10-CM

## 2023-05-25 NOTE — BH Specialist Note (Signed)
Integrated Behavioral Health Initial In-Person Visit  MRN: [id number] Name: Catherine [name]  Number of Integrated Behavioral Health Clinician visits: 1- Initial Visit  Session Start time: 1400    Session End time: 1430  Total time in minutes: 30   Types of Service: Telephone visit, General Behavioral Integrated Care (BHI), and Introduction only  Interpretor:No. Interpretor Name and Language: N/A   Warm Hand Off Completed.        Subjective: Catherine [name] is a 22 y.o. female  Patient was referred by Tyson Alias, MD for Anxiety. Patient reports the following symptoms/concerns: Concerns with prescription coverage and anxiety attacks. Duration of problem: Over a year; Severity of problem: moderate  Objective: Mood: NA and Affect: Appropriate Risk of harm to self or others: No plan to harm self or others  Life Context: Family and Social: Patient has a boyfriend School/Work: Patient is employed   Patient and/or Family's Strengths/Protective Factors: Sense of purpose  Goals Addressed: Patient will: Reduce symptoms of: anxiety   Progress towards Goals: Ongoing  Interventions: Interventions utilized: Motivational Interviewing, Solution-Focused Strategies, and Mindfulness or Relaxation Training  Standardized Assessments completed: PHQ-SADS   Assessment: Patient currently experiencing barriers with insurance. Patient new insurance no longer covers medication. [facility] reviewed chart. Patient may have double insurance and need to make [facility] pharmacy aware. [facility] educated patient on how to do so.   Patient will contact PCP and request a medication change that insurance will cover if needed.   Patient discussed having anxiety attacks. [facility] educated patient on deep breathing exercised and demonstrated.    Patient may benefit from ongoing therapy. Patient prefers telehealth and in morning appointments.  Plan: Follow up with behavioral health clinician on : July 23 via  telephone  Christen Butter, MSW, LCSW-A She/Her Behavioral Health Clinician [facility]  Internal Medicine Center Direct Dial:[phone]  Fax [phone] Main Office Phone: [phone] [address] [place] [place], [place] [address] Website: [facility] Internal Medicine [facility]  [place], [place]  [place]

## 2023-06-21 ENCOUNTER — Inpatient Hospital Stay (HOSPITAL_COMMUNITY)
Admission: AD | Admit: 2023-06-21 | Discharge: 2023-06-21 | Disposition: A | Discharge status: Home / Self Care | Payer: Medicaid Other | Attending: Obstetrics and Gynecology | Admitting: Obstetrics and Gynecology

## 2023-06-21 ENCOUNTER — Encounter (HOSPITAL_COMMUNITY): Payer: Self-pay

## 2023-06-21 DIAGNOSIS — O99511 Diseases of the respiratory system complicating pregnancy, first trimester: Secondary | ICD-10-CM | POA: Insufficient documentation

## 2023-06-21 DIAGNOSIS — Z791 Long term (current) use of non-steroidal anti-inflammatories (NSAID): Secondary | ICD-10-CM | POA: Insufficient documentation

## 2023-06-21 DIAGNOSIS — Z79899 Other long term (current) drug therapy: Secondary | ICD-10-CM | POA: Diagnosis not present

## 2023-06-21 DIAGNOSIS — Z3A01 Less than 8 weeks gestation of pregnancy: Secondary | ICD-10-CM | POA: Diagnosis not present

## 2023-06-21 DIAGNOSIS — R102 Pelvic and perineal pain: Secondary | ICD-10-CM | POA: Diagnosis not present

## 2023-06-21 DIAGNOSIS — O209 Hemorrhage in early pregnancy, unspecified: Secondary | ICD-10-CM | POA: Diagnosis not present

## 2023-06-21 DIAGNOSIS — O26891 Other specified pregnancy related conditions, first trimester: Secondary | ICD-10-CM | POA: Diagnosis not present

## 2023-06-21 LAB — URINALYSIS, ROUTINE W REFLEX MICROSCOPIC
Bilirubin Urine: NEGATIVE
Glucose, UA: NEGATIVE mg/dL
Ketones, ur: 80 mg/dL — AB
Leukocytes,Ua: NEGATIVE
Nitrite: NEGATIVE
Protein, ur: 30 mg/dL — AB
Specific Gravity, Urine: 1.023 (ref 1.005–1.030)
pH: 5 (ref 5.0–8.0)

## 2023-06-21 LAB — WET PREP, GENITAL
Clue Cells Wet Prep HPF POC: NONE SEEN
Sperm: NONE SEEN
Trich, Wet Prep: NONE SEEN
WBC, Wet Prep HPF POC: <10 (ref <10)
Yeast Wet Prep HPF POC: NONE SEEN

## 2023-06-21 LAB — CBC
HCT: 38.3 % (ref 36.0–46.0)
Hemoglobin: 12.1 g/dL (ref 12.0–15.0)
MCH: 23.9 pg — ABNORMAL LOW (ref 26.0–34.0)
MCHC: 31.6 g/dL (ref 30.0–36.0)
MCV: 75.7 fL — ABNORMAL LOW (ref 80.0–100.0)
Platelets: 434 10*3/uL — ABNORMAL HIGH (ref 150–400)
RBC: 5.06 MIL/uL (ref 3.87–5.11)
RDW: 16.4 % — ABNORMAL HIGH (ref 11.5–15.5)
WBC: 13.3 10*3/uL — ABNORMAL HIGH (ref 4.0–10.5)
nRBC: 0 % (ref 0.0–0.2)

## 2023-06-21 LAB — HCG, QUANTITATIVE, PREGNANCY: hCG, Beta Chain, Quant, S: 76425 m[IU]/mL — ABNORMAL HIGH (ref <5)

## 2023-06-21 MED ORDER — ACETAMINOPHEN 500 MG PO TABS
1000.0000 mg | ORAL_TABLET | Freq: Once | ORAL | Status: AC
  Administered 2023-06-21: 1000 mg via ORAL
  Filled 2023-06-21: qty 2

## 2023-06-21 NOTE — Discharge Instructions (Signed)
Your ultrasound shows a normal pregnancy. We discussed that vaginal bleeding in the first trimester is common, and that 80-90% of patients will go on to have a normal pregnancy with a live delivery. The remainder are at increased risk for miscarriage, unfortunately there are no known interventions to mitigate this risk. We discussed return precautions including crescendo abdominal pain, heavy vaginal bleeding soaking >1 pad/hour, and fever.   [facility] [employer] for [employer] at [employer] for [facility]             [address], [place], [place] [address] [phone]  Center for [facility] at [facility]                                                             [address], [place], [place], [address] [phone]  Center for [facility] [address], [address], [place], [place], [address] [phone]  Center for [facility] at [facility] [address], [place], [place], [address] [phone]  Center for [facility] at [facility]                                 [address] [place], [place], [place], [address] [phone]  Center for [facility]                                    [address] [place], [place], [address] [phone]  Center for [facility] at [facility] [address], [place], [place], [address]                              [facility] [address], [place], [place], [address] [phone]  [facility]         Phone: [phone]  [facility]      Phone: [phone]   [facility]      Phone: [phone]  [facility] Health Department-Family Planning         Phone: [phone]   [facility] Health Department-Maternity    Phone: [phone]  [facility]      Phone: [phone]  [facility]     Phone: [phone]  [facility]        Phone: [phone]  [facility] [place]) [phone]  [facility]      Phone: [phone]

## 2023-06-21 NOTE — MAU Note (Signed)
.  Catherine [name] is a 22 y.o. at Unknown here in MAU reporting: Vaginal bleeding since 7/14 as well as occasional lower abdominal pain since yesterday. She reports she had three positive pregnancy tests at home yesterday. She reports the bleeding is light and dark red and reports yesterday she only saw the blood when she wiped. She reports today the blood has increased and she has a "smear" of blood on her pad. Denies blood clots. Denies recent IC. Denies vaginal itching and vaginal odors.  LMP: 04/29/2023 - irregular periods Onset of complaint: 7/14 Pain score: 3/10 lower abdomen - "prickly"  Lab orders placed from triage:  UA

## 2023-06-21 NOTE — MAU Provider Note (Signed)
History     [id number]  Arrival date and time: 06/21/23 1526    Chief Complaint  Patient presents with   Vaginal Bleeding   Abdominal Pain     HPI Catherine [name] is a 22 y.o. at 108w4d by LMP, who presents for vaginal bleeding and pelvic cramping.   Patient found out she was pregnant yesterday Started bleeding three days ago so got worried  Having intermittent low pelvic cramping Bleeding is like a period No vaginal discharge No burning or pain with urination No fevers     OB History     Gravida  1   Para  0   Term  0   Preterm  0   AB  0   Living  0      SAB  0   IAB  0   Ectopic  0   Multiple  0   Live Births  0           Past Medical History:  Diagnosis Date   Allergy    Asthma    past hx   Blanching rash 10/19/2022   Family history of brain aneurysm- father died around age 54  06-Apr-2020   High risk heterosexual behavior 2020/04/06   History of anemia 04/06/20   Migraine    Pelvic pain Apr 06, 2020   Polyuria 2020/04/06   Right lower quadrant pain 04/06/2020    Past Surgical History:  Procedure Laterality Date   TONSILLECTOMY Bilateral 08/11/2015   Procedure: CONTROL POST TONSILLECTOMY BLEEDING ;  Surgeon: Vernie Murders, MD;  Location: [facility] ORS;  Service: ENT;  Laterality: Bilateral;   TONSILLECTOMY AND ADENOIDECTOMY N/A 07/31/2015   Procedure: TONSILLECTOMY AND ADENOIDECTOMY;  Surgeon: Linus Salmons, MD;  Location: [facility];  Service: ENT;  Laterality: N/A;    Family History  Problem Relation Age of Onset   Hypertension Father    Aneurysm Father    Hypertension Brother    Diabetes Brother    Breast cancer Maternal Grandmother    Cancer Maternal Grandmother    Lung disease Maternal Grandmother    Bipolar disorder Mother    Depression Mother    Cancer Mother    Schizophrenia Mother    Aneurysm Paternal Grandfather    Heart attack Maternal Grandfather     Social History   Socioeconomic History   Marital  status: Single    Spouse name: Not on file   Number of children: Not on file   Years of education: Not on file   Highest education level: Not on file  Occupational History   Not on file  Tobacco Use   Smoking status: Never    Passive exposure: Never   Smokeless tobacco: Never  Vaping Use   Vaping status: Never Used  Substance and Sexual Activity   Alcohol use: Yes    Comment: once per month, 1-2 drinks   Drug use: Yes    Types: Marijuana   Sexual activity: Yes    Birth control/protection: None  Other Topics Concern   Not on file  Social History Narrative   Not on file   Social Determinants of Health   Financial Resource Strain: Not on file  Food Insecurity: No Food Insecurity (01/18/2023)   Hunger Vital Sign    Worried About Running Out of Food in the Last Year: Never true    Ran Out of Food in the Last Year: Never true  Transportation Needs: No Transportation Needs (01/18/2023)   PRAPARE - Transportation  Lack of Transportation (Medical): No    Lack of Transportation (Non-Medical): No  Physical Activity: Not on file  Stress: Not on file  Social Connections: Moderately Integrated (01/18/2023)   Social Connection and Isolation Panel [NHANES]    Frequency of Communication with Friends and Family: More than three times a week    Frequency of Social Gatherings with Friends and Family: More than three times a week    Attends Religious Services: More than 4 times per year    Active Member of [employer] or Organizations: No    Attends Banker Meetings: Never    Marital Status: Living with partner  Intimate Partner Violence: Not At Risk (01/18/2023)   Humiliation, Afraid, Rape, and Kick questionnaire    Fear of Current or Ex-Partner: No    Emotionally Abused: No    Physically Abused: No    Sexually Abused: No    Allergies  Allergen Reactions   Amoxicillin Hives    No current facility-administered medications on file prior to encounter.   Current Outpatient  Medications on File Prior to Encounter  Medication Sig Dispense Refill   Cholecalciferol (VITAMIN D) 50 MCG (2000 UT) tablet Take 1 tablet (2,000 Units total) by mouth daily. 90 tablet 1   diclofenac (VOLTAREN) 75 MG EC tablet Take 1 tablet (75 mg total) by mouth 2 (two) times daily after a meal. 60 tablet 2   escitalopram (LEXAPRO) 20 MG tablet Take 1 tablet (20 mg total) by mouth daily. 90 tablet 3   hydrOXYzine (ATARAX) 25 MG tablet Take 1 tablet (25 mg total) by mouth 3 (three) times daily as needed. 30 tablet 2   ipratropium (ATROVENT) 0.06 % nasal spray Place 2 sprays into both nostrils 4 (four) times daily. 15 mL 12   Multiple Vitamins-Minerals (MULTIVITAMIN WITH MINERALS) tablet Take 1 tablet by mouth daily. 100 tablet 0   propranolol (INDERAL) 10 MG tablet Take 1 tablet (10 mg total) by mouth daily as needed. 30 tablet 11   topiramate (TOPAMAX) 25 MG tablet Take 1 tablet (25 mg total) by mouth See admin instructions for 28 days. 25 mg orally once daily in the evening for first week, 25 mg twice daily for second week, 25 mg in the morning and 50 mg in the evening for third week, and then 50 mg twice daily 49 tablet 0   triamcinolone ointment (KENALOG) 0.5 % Apply 1 Application topically 2 (two) times daily. Apply to areas of rash. 30 g 1     ROS Pertinent positives and negative per HPI, all others reviewed and negative  Physical Exam   BP 129/82 (BP Location: Right Arm)   Pulse 87   Temp 98.4 F (36.9 C) (Oral)   Resp 14   Ht 5\' 3"  (1.6 m)   Wt 117.7 kg   LMP 04/29/2023 (Exact Date)   SpO2 99%   BMI 45.95 kg/m   Patient Vitals for the past 24 hrs:  BP Temp Temp src Pulse Resp SpO2 Height Weight  06/21/23 1613 129/82 98.4 F (36.9 C) Oral 87 14 99 % 5\' 3"  (1.6 m) 117.7 kg    Physical Exam Vitals reviewed.  Constitutional:      General: She is not in acute distress.    Appearance: She is well-developed. She is not diaphoretic.  Eyes:     General: No scleral  icterus. Pulmonary:     Effort: Pulmonary effort is normal. No respiratory distress.  Abdominal:     General: There is  no distension.     Palpations: Abdomen is soft.     Tenderness: There is no abdominal tenderness. There is no guarding or rebound.  Skin:    General: Skin is warm and dry.  Neurological:     Mental Status: She is alert.     Coordination: Coordination normal.      Cervical Exam    Bedside Ultrasound Pt informed that the ultrasound is considered a limited OB ultrasound and is not intended to be a complete ultrasound exam.  Patient also informed that the ultrasound is not being completed with the intent of assessing for fetal or placental anomalies or any pelvic abnormalities.  Explained that the purpose of today's ultrasound is to assess for  viability.  Patient acknowledges the purpose of the exam and the limitations of the study.      My interpretation: First trimester findings: Intrauterine gestational sac seen: yes Gestational sac summary: fetal pole seen, yolk sac seen, fetal cardiac activity Fetal cardiac activity: 164 bpm by M mode   Labs Results for orders placed or performed during the hospital encounter of 06/21/23 (from the past 24 hour(s))  Urinalysis, Routine w reflex microscopic -Urine, Clean Catch     Status: Abnormal   Collection Time: 06/21/23  5:26 PM  Result Value Ref Range   Color, Urine YELLOW YELLOW   APPearance HAZY (A) CLEAR   Specific Gravity, Urine 1.023 1.005 - 1.030   pH 5.0 5.0 - 8.0   Glucose, UA NEGATIVE NEGATIVE mg/dL   Hgb urine dipstick LARGE (A) NEGATIVE   Bilirubin Urine NEGATIVE NEGATIVE   Ketones, ur 80 (A) NEGATIVE mg/dL   Protein, ur 30 (A) NEGATIVE mg/dL   Nitrite NEGATIVE NEGATIVE   Leukocytes,Ua NEGATIVE NEGATIVE   RBC / HPF 11-20 0 - 5 RBC/hpf   WBC, UA 0-5 0 - 5 WBC/hpf   Bacteria, UA RARE (A) NONE SEEN   Squamous Epithelial / HPF 11-20 0 - 5 /HPF   Mucus PRESENT   Wet prep, genital     Status: None    Collection Time: 06/21/23  5:28 PM   Specimen: Vaginal  Result Value Ref Range   Yeast Wet Prep HPF POC NONE SEEN NONE SEEN   Trich, Wet Prep NONE SEEN NONE SEEN   Clue Cells Wet Prep HPF POC NONE SEEN NONE SEEN   WBC, Wet Prep HPF POC <10 <10   Sperm NONE SEEN     Imaging No results found.  MAU Course  Procedures Lab Orders         Wet prep, genital         Urinalysis, Routine w reflex microscopic -Urine, Clean Catch         CBC         hCG, quantitative, pregnancy    Meds ordered this encounter  Medications   acetaminophen (TYLENOL) tablet 1,000 mg   Imaging Orders  No imaging studies ordered today    MDM Moderate (Level 3-4)  Assessment and Plan  Vaginal bleeding in pregnancy, first trimester  Pelvic pain in pregnancy, antepartum, first trimester  [redacted] weeks gestation of pregnancy US shows viable IUP with normal FHR. We discussed that vaginal bleeding in the first trimester is common, and that 80-90% of patients will go on to have a normal pregnancy with a live delivery. The remainder are at increased risk for miscarriage, unfortunately there are no known interventions to mitigate this risk. Blood type O post, rhogam not indicated. We discussed return precautions  including crescendo abdominal pain, heavy vaginal bleeding soaking >1 pad/hour, and fever.    Dispo: discharged to home in stable condition    Venora Maples, MD/MPH 06/21/23 7:36 PM  Allergies as of 06/21/2023       Reactions   Amoxicillin Hives        Medication List     STOP taking these medications    diclofenac 75 MG EC tablet Commonly known as: VOLTAREN       TAKE these medications    escitalopram 20 MG tablet Commonly known as: Lexapro Take 1 tablet (20 mg total) by mouth daily.   hydrOXYzine 25 MG tablet Commonly known as: ATARAX Take 1 tablet (25 mg total) by mouth 3 (three) times daily as needed.   ipratropium 0.06 % nasal spray Commonly known as: ATROVENT Place 2  sprays into both nostrils 4 (four) times daily.   multivitamin with minerals tablet Take 1 tablet by mouth daily.   propranolol 10 MG tablet Commonly known as: INDERAL Take 1 tablet (10 mg total) by mouth daily as needed.   topiramate 25 MG tablet Commonly known as: TOPAMAX Take 1 tablet (25 mg total) by mouth See admin instructions for 28 days. 25 mg orally once daily in the evening for first week, 25 mg twice daily for second week, 25 mg in the morning and 50 mg in the evening for third week, and then 50 mg twice daily   triamcinolone ointment 0.5 % Commonly known as: KENALOG Apply 1 Application topically 2 (two) times daily. Apply to areas of rash.   Vitamin D 50 MCG (2000 UT) tablet Take 1 tablet (2,000 Units total) by mouth daily.

## 2023-06-22 ENCOUNTER — Telehealth: Payer: Self-pay | Admitting: *Deleted

## 2023-06-22 LAB — ABO/RH: ABO/RH(D): O POS

## 2023-06-22 LAB — GC/CHLAMYDIA PROBE AMP (~~LOC~~) NOT AT ARMC
Chlamydia: NEGATIVE
Comment: NEGATIVE
Comment: NORMAL
Neisseria Gonorrhea: NEGATIVE

## 2023-06-22 NOTE — Telephone Encounter (Signed)
Call from [facility] to [facility].  Patient is [redacted] weeks pregnant.  Patient had cultures and labs done on yesterday. Had a little spotting at exam.  States continues to have some spotting. Patient was advised to call the [facility] to see what other follow up needs to be done.  [facility] to call the [facility].

## 2023-06-27 ENCOUNTER — Ambulatory Visit (INDEPENDENT_AMBULATORY_CARE_PROVIDER_SITE_OTHER): Payer: Medicaid Other | Admitting: Licensed Clinical Social Worker

## 2023-06-27 DIAGNOSIS — F419 Anxiety disorder, unspecified: Secondary | ICD-10-CM

## 2023-06-27 DIAGNOSIS — F32A Depression, unspecified: Secondary | ICD-10-CM

## 2023-06-27 NOTE — BH Specialist Note (Signed)
Patient no-showed today's appointment; appointment was for Telephone visit at 9:30am  Behavioral Health Clinician [facility] from here on out)  attempted patient twice via Telephone number [phone]    [facility] contacted patient from telephone number [phone]. [facility] left a  VM.   Patient will need to reschedule appointment by calling Internal medicine center [phone].  Christen Butter, MSW, LCSW-A She/Her Behavioral Health Clinician [facility]  Internal Medicine Center Direct [phone]  Fax [phone] Main Office Phone: [phone] [address] [place] [place], [place] [address] Website: [facility] Internal Medicine [facility]  [place], [place]  [place]

## 2023-07-02 ENCOUNTER — Inpatient Hospital Stay (HOSPITAL_COMMUNITY): Payer: Medicaid Other

## 2023-07-02 ENCOUNTER — Encounter (HOSPITAL_COMMUNITY): Payer: Self-pay | Admitting: Obstetrics & Gynecology

## 2023-07-02 ENCOUNTER — Inpatient Hospital Stay (HOSPITAL_COMMUNITY)
Admission: AD | Admit: 2023-07-02 | Discharge: 2023-07-02 | Disposition: A | Discharge status: Home / Self Care | Payer: Medicaid Other | Attending: Obstetrics & Gynecology | Admitting: Obstetrics & Gynecology

## 2023-07-02 DIAGNOSIS — Z3A09 9 weeks gestation of pregnancy: Secondary | ICD-10-CM

## 2023-07-02 DIAGNOSIS — I498 Other specified cardiac arrhythmias: Secondary | ICD-10-CM | POA: Diagnosis not present

## 2023-07-02 DIAGNOSIS — N760 Acute vaginitis: Secondary | ICD-10-CM

## 2023-07-02 DIAGNOSIS — O26899 Other specified pregnancy related conditions, unspecified trimester: Secondary | ICD-10-CM | POA: Diagnosis not present

## 2023-07-02 DIAGNOSIS — O99411 Diseases of the circulatory system complicating pregnancy, first trimester: Secondary | ICD-10-CM | POA: Diagnosis not present

## 2023-07-02 DIAGNOSIS — B9689 Other specified bacterial agents as the cause of diseases classified elsewhere: Secondary | ICD-10-CM

## 2023-07-02 DIAGNOSIS — M545 Low back pain, unspecified: Secondary | ICD-10-CM | POA: Diagnosis not present

## 2023-07-02 DIAGNOSIS — R109 Unspecified abdominal pain: Secondary | ICD-10-CM

## 2023-07-02 DIAGNOSIS — O26851 Spotting complicating pregnancy, first trimester: Secondary | ICD-10-CM

## 2023-07-02 DIAGNOSIS — R079 Chest pain, unspecified: Secondary | ICD-10-CM | POA: Diagnosis not present

## 2023-07-02 DIAGNOSIS — O23591 Infection of other part of genital tract in pregnancy, first trimester: Secondary | ICD-10-CM | POA: Diagnosis not present

## 2023-07-02 DIAGNOSIS — O26891 Other specified pregnancy related conditions, first trimester: Secondary | ICD-10-CM | POA: Diagnosis not present

## 2023-07-02 LAB — CBC
HCT: 39.2 % (ref 36.0–46.0)
Hemoglobin: 12.7 g/dL (ref 12.0–15.0)
MCH: 24.7 pg — ABNORMAL LOW (ref 26.0–34.0)
MCHC: 32.4 g/dL (ref 30.0–36.0)
MCV: 76.3 fL — ABNORMAL LOW (ref 80.0–100.0)
Platelets: 358 10*3/uL (ref 150–400)
RBC: 5.14 MIL/uL — ABNORMAL HIGH (ref 3.87–5.11)
RDW: 17.1 % — ABNORMAL HIGH (ref 11.5–15.5)
WBC: 12 10*3/uL — ABNORMAL HIGH (ref 4.0–10.5)
nRBC: 0 % (ref 0.0–0.2)

## 2023-07-02 LAB — URINALYSIS, ROUTINE W REFLEX MICROSCOPIC
Bilirubin Urine: NEGATIVE
Glucose, UA: NEGATIVE mg/dL
Hgb urine dipstick: NEGATIVE
Ketones, ur: 20 mg/dL — AB
Leukocytes,Ua: NEGATIVE
Nitrite: NEGATIVE
Protein, ur: NEGATIVE mg/dL
Specific Gravity, Urine: 1.015 (ref 1.005–1.030)
pH: 6 (ref 5.0–8.0)

## 2023-07-02 LAB — WET PREP, GENITAL
Sperm: NONE SEEN
Trich, Wet Prep: NONE SEEN
WBC, Wet Prep HPF POC: >=10 — AB (ref <10)
Yeast Wet Prep HPF POC: NONE SEEN

## 2023-07-02 LAB — COMPREHENSIVE METABOLIC PANEL
ALT: 12 U/L (ref 0–44)
AST: 14 U/L — ABNORMAL LOW (ref 15–41)
Albumin: 3.6 g/dL (ref 3.5–5.0)
Alkaline Phosphatase: 45 U/L (ref 38–126)
Anion gap: 12 (ref 5–15)
BUN: <5 mg/dL — ABNORMAL LOW (ref 6–20)
CO2: 21 mmol/L — ABNORMAL LOW (ref 22–32)
Calcium: 9.2 mg/dL (ref 8.9–10.3)
Chloride: 102 mmol/L (ref 98–111)
Creatinine, Ser: 0.64 mg/dL (ref 0.44–1.00)
GFR, Estimated: >60 mL/min (ref >60)
Glucose, Bld: 88 mg/dL (ref 70–99)
Potassium: 4 mmol/L (ref 3.5–5.1)
Sodium: 135 mmol/L (ref 135–145)
Total Bilirubin: 0.4 mg/dL (ref 0.3–1.2)
Total Protein: 6.7 g/dL (ref 6.5–8.1)

## 2023-07-02 LAB — PROTEIN / CREATININE RATIO, URINE
Creatinine, Urine: 160 mg/dL
Protein Creatinine Ratio: 0.04 mg/mg{Cre} (ref 0.00–0.15)
Total Protein, Urine: 7 mg/dL

## 2023-07-02 LAB — TROPONIN I (HIGH SENSITIVITY): Troponin I (High Sensitivity): <2 ng/L (ref <18)

## 2023-07-02 MED ORDER — METRONIDAZOLE 500 MG PO TABS
500.0000 mg | ORAL_TABLET | Freq: Two times a day (BID) | ORAL | 0 refills | Status: DC
Start: 2023-07-02 — End: 2023-09-14

## 2023-07-02 MED ORDER — ONDANSETRON 4 MG PO TBDP: 4.0000 mg | ORAL_TABLET | Freq: Three times a day (TID) | ORAL | 0 refills | Status: DC | PRN

## 2023-07-02 NOTE — MAU Provider Note (Signed)
History     CSN: [id number]  Arrival date and time: 07/02/23 1411   Event Date/Time   First Provider Initiated Contact with Patient 07/02/23 1505      Chief Complaint  Patient presents with   Vaginal Bleeding   Abdominal Pain   Back Pain   Chest Pain   Shortness of Breath   Catherine [name] is a 22 y.o. year old [id number] female at [redacted]w[redacted]d weeks gestation who presents to MAU reporting she saw blood that was more than spotting, bright red. She was seen previously in MAU for spotting. She denies recent SI; last was 3 days ago. She reports mild cramping that is worsening; rated 6/10. She has intermittent lower back pain and chest pain that has been on-going for the past 2 weeks. She describes the CP as "sharp and shooting." She states she has had to rub her chest " to get it to calm down." She denies the pain is related to any activity or eating.    OB History     Gravida  1   Para  0   Term  0   Preterm  0   AB  0   Living  0      SAB  0   IAB  0   Ectopic  0   Multiple  0   Live Births  0           Past Medical History:  Diagnosis Date   Allergy    Asthma    past hx   Blanching rash 10/19/2022   Family history of brain aneurysm- father died around age 60  May 01, 2020   High risk heterosexual behavior 05-01-2020   History of anemia 05/01/20   Migraine    Pelvic pain 01-May-2020   Polyuria 05/01/2020   Right lower quadrant pain 05-01-2020    Past Surgical History:  Procedure Laterality Date   TONSILLECTOMY Bilateral 08/11/2015   Procedure: CONTROL POST TONSILLECTOMY BLEEDING ;  Surgeon: Vernie Murders, MD;  Location: [facility];  Service: ENT;  Laterality: Bilateral;   TONSILLECTOMY AND ADENOIDECTOMY N/A 07/31/2015   Procedure: TONSILLECTOMY AND ADENOIDECTOMY;  Surgeon: Linus Salmons, MD;  Location: [facility];  Service: ENT;  Laterality: N/A;    Family History  Problem Relation Age of Onset   Hypertension Father    Aneurysm Father     Hypertension Brother    Diabetes Brother    Breast cancer Maternal Grandmother    Cancer Maternal Grandmother    Lung disease Maternal Grandmother    Bipolar disorder Mother    Depression Mother    Cancer Mother    Schizophrenia Mother    Aneurysm Paternal Grandfather    Heart attack Maternal Grandfather     Social History   Tobacco Use   Smoking status: Never    Passive exposure: Never   Smokeless tobacco: Never  Vaping Use   Vaping status: Never Used  Substance Use Topics   Alcohol use: Yes    Comment: once per month, 1-2 drinks   Drug use: Not Currently    Types: Marijuana    Allergies:  Allergies  Allergen Reactions   Amoxicillin Hives    Medications Prior to Admission  Medication Sig Dispense Refill Last Dose   Cholecalciferol (VITAMIN D) 50 MCG (2000 UT) tablet Take 1 tablet (2,000 Units total) by mouth daily. 90 tablet 1    escitalopram (LEXAPRO) 20 MG tablet Take 1 tablet (20 mg total) by mouth daily.  90 tablet 3 More than a month   hydrOXYzine (ATARAX) 25 MG tablet Take 1 tablet (25 mg total) by mouth 3 (three) times daily as needed. 30 tablet 2 More than a month   ipratropium (ATROVENT) 0.06 % nasal spray Place 2 sprays into both nostrils 4 (four) times daily. 15 mL 12 Unknown   Multiple Vitamins-Minerals (MULTIVITAMIN WITH MINERALS) tablet Take 1 tablet by mouth daily. 100 tablet 0 Unknown   propranolol (INDERAL) 10 MG tablet Take 1 tablet (10 mg total) by mouth daily as needed. 30 tablet 11    topiramate (TOPAMAX) 25 MG tablet Take 1 tablet (25 mg total) by mouth See admin instructions for 28 days. 25 mg orally once daily in the evening for first week, 25 mg twice daily for second week, 25 mg in the morning and 50 mg in the evening for third week, and then 50 mg twice daily 49 tablet 0    triamcinolone ointment (KENALOG) 0.5 % Apply 1 Application topically 2 (two) times daily. Apply to areas of rash. 30 g 1 Unknown    Review of Systems  Constitutional:  Negative.   HENT: Negative.    Eyes: Negative.   Respiratory:  Positive for shortness of breath.   Cardiovascular:  Positive for chest pain.  Gastrointestinal: Negative.   Endocrine: Negative.   Genitourinary:  Positive for pelvic pain and vaginal bleeding.  Musculoskeletal:  Positive for back pain.  Skin: Negative.   Allergic/Immunologic: Negative.   Neurological: Negative.   Hematological: Negative.   Psychiatric/Behavioral: Negative.     Physical Exam  Patient Vitals for the past 24 hrs:  BP Temp Temp src Pulse Resp SpO2 Height Weight  07/02/23 1518 120/74 -- -- 86 -- -- -- --  07/02/23 1500 110/77 -- -- 83 -- 100 % -- --  07/02/23 1455 -- -- -- -- -- 99 % -- --  07/02/23 1450 -- -- -- -- -- 98 % -- --  07/02/23 1446 108/76 -- -- 77 -- -- -- --  07/02/23 1427 137/85 97.9 F (36.6 C) Oral 71 16 100 % 5\' 3"  (1.6 m) 116.1 kg     Physical Exam Vitals and nursing note reviewed. Exam conducted with a chaperone present.  Constitutional:      Appearance: Normal appearance. She is obese.  Cardiovascular:     Rate and Rhythm: Normal rate.  Pulmonary:     Effort: Pulmonary effort is normal.  Abdominal:     Palpations: Abdomen is soft.  Genitourinary:    General: Normal vulva.     Comments: Pelvic exam: External genitalia normal, SE: vaginal walls pink and well rugated, cervix is smooth, pink, no lesions, scant amt of clear, white vaginal d/c with one miniscule stripe of blood in it -- WP, GC/CT done, cervix visually closed, bimanual deferred Musculoskeletal:        General: Normal range of motion.  Skin:    General: Skin is warm and dry.  Neurological:     Mental Status: She is alert and oriented to person, place, and time.  Psychiatric:        Mood and Affect: Mood normal.        Behavior: Behavior normal.        Thought Content: Thought content normal.        Judgment: Judgment normal.     MAU Course  Procedures  MDM CCUA PEC labs Wet Prep GC/CT -- Results  pending OB <14 wks U/S  Results for orders placed or  performed during the hospital encounter of 07/02/23 (from the past 24 hour(s))  CBC     Status: Abnormal   Collection Time: 07/02/23  3:08 PM  Result Value Ref Range   WBC 12.0 (H) 4.0 - 10.5 K/uL   RBC 5.14 (H) 3.87 - 5.11 MIL/uL   Hemoglobin 12.7 12.0 - 15.0 g/dL   HCT 78.4 69.6 - 29.5 %   MCV 76.3 (L) 80.0 - 100.0 fL   MCH 24.7 (L) 26.0 - 34.0 pg   MCHC 32.4 30.0 - 36.0 g/dL   RDW 28.4 (H) 13.2 - 44.0 %   Platelets 358 150 - 400 K/uL   nRBC 0.0 0.0 - 0.2 %  Troponin I (High Sensitivity)     Status: None   Collection Time: 07/02/23  3:08 PM  Result Value Ref Range   Troponin I (High Sensitivity) <2 <18 ng/L  Comprehensive metabolic panel     Status: Abnormal   Collection Time: 07/02/23  3:08 PM  Result Value Ref Range   Sodium 135 135 - 145 mmol/L   Potassium 4.0 3.5 - 5.1 mmol/L   Chloride 102 98 - 111 mmol/L   CO2 21 (L) 22 - 32 mmol/L   Glucose, Bld 88 70 - 99 mg/dL   BUN <5 (L) 6 - 20 mg/dL   Creatinine, Ser 1.02 0.44 - 1.00 mg/dL   Calcium 9.2 8.9 - 72.5 mg/dL   Total Protein 6.7 6.5 - 8.1 g/dL   Albumin 3.6 3.5 - 5.0 g/dL   AST 14 (L) 15 - 41 U/L   ALT 12 0 - 44 U/L   Alkaline Phosphatase 45 38 - 126 U/L   Total Bilirubin 0.4 0.3 - 1.2 mg/dL   GFR, Estimated >36 >64 mL/min   Anion gap 12 5 - 15  Protein / creatinine ratio, urine     Status: None   Collection Time: 07/02/23  3:11 PM  Result Value Ref Range   Creatinine, Urine 160 mg/dL   Total Protein, Urine 7 mg/dL   Protein Creatinine Ratio 0.04 0.00 - 0.15 mg/mg[Cre]  Urinalysis, Routine w reflex microscopic -Urine, Clean Catch     Status: Abnormal   Collection Time: 07/02/23  3:11 PM  Result Value Ref Range   Color, Urine YELLOW YELLOW   APPearance CLEAR CLEAR   Specific Gravity, Urine 1.015 1.005 - 1.030   pH 6.0 5.0 - 8.0   Glucose, UA NEGATIVE NEGATIVE mg/dL   Hgb urine dipstick NEGATIVE NEGATIVE   Bilirubin Urine NEGATIVE NEGATIVE   Ketones, ur  20 (A) NEGATIVE mg/dL   Protein, ur NEGATIVE NEGATIVE mg/dL   Nitrite NEGATIVE NEGATIVE   Leukocytes,Ua NEGATIVE NEGATIVE  Wet prep, genital     Status: Abnormal   Collection Time: 07/02/23  3:30 PM   Specimen: Cervix  Result Value Ref Range   Yeast Wet Prep HPF POC NONE SEEN NONE SEEN   Trich, Wet Prep NONE SEEN NONE SEEN   Clue Cells Wet Prep HPF POC PRESENT (A) NONE SEEN   WBC, Wet Prep HPF POC >=10 (A) <10   Sperm NONE SEEN    US OB LESS THAN 14 WEEKS WITH OB TRANSVAGINAL  Result Date: 07/02/2023 CLINICAL DATA:  Spotting.  Pregnant EXAM: OBSTETRIC <14 WK [place] TECHNIQUE: Transabdominal ultrasound examinations were performed for complete evaluation of the gestation as well as the maternal uterus, adnexal regions, and pelvic cul-de-sac. COMPARISON:  None Available. FINDINGS: Intrauterine gestational sac: Single Yolk sac:  Visualized. Embryo:  Visualized. Cardiac Activity:  Yes Heart Rate: 171 bpm CRL:  25.3 mm   9 w   1 d                  [place] EDC: 02/03/2024 Subchorionic hemorrhage:  None visualized. Maternal uterus/adnexae: Right ovary measures 2.2 by 1.9 x 2.5 cm and left 1.7 x 1.2 x 1.7 cm. No free fluid in the maternal pelvis. IMPRESSION: Single live intrauterine pregnancy of 9 weeks and 1 day. Positive fetal heart motion Electronically Signed   By: Karen Kays M.D.   On: 07/02/2023 16:13     Assessment and Plan  1. Spotting and cramping affecting pregnancy, antepartum - Explained that on exam the streak of blood seen was coming from the surface of her cervix and not out of her cervical os/from the uterus. The likelihood that this is a direct result of BV is high.  2. Bacterial vaginosis - Information provided on BV  - Rx: Flagyl 500 mg po BID x 7 days   3. [redacted] weeks gestation of pregnancy   - Discharge patient - Keep scheduled appt with [facility] on 07/13/2023 - Patient verbalized an understanding of the plan of care and agrees.   Raelyn Mora, CNM 07/02/2023, 3:07 PM

## 2023-07-02 NOTE — MAU Note (Addendum)
Catherine [name] is a 21 y.o. at [redacted]w[redacted]d here in MAU reporting: when she went to the bathroom this morning, she saw blood, more than spotting. Bright red.  Was here previously for spotting, it had stopped. Denies recent intercourse. Started cramping, started as light, then got worse.  Called the nurse line, reported pain in lower back, comes and goes- but when it comes it is really bad.  Also reported chest pain, comes and goes, but when comes is really bad.  Has experienced both today.  Chest pain was sharp and shooting, mid chest, "had to rub her chest to get it to calm down".  Pain is not related to activity or eating.  Back pain on and off throughout the night, chest pain once today. Sometimes hurts to breath and gets SOB with the chest pain. Onset of complaint: yesterday Pain score: abd pain 6, back 8, chest 4 Vitals:   07/02/23 1427  BP: 137/85  Pulse: 71  Resp: 16  Temp: 97.9 F (36.6 C)  SpO2: 100%     Lab orders placed from triage:    Urine collected

## 2023-07-04 ENCOUNTER — Inpatient Hospital Stay (HOSPITAL_COMMUNITY)
Admission: AD | Admit: 2023-07-04 | Discharge: 2023-07-04 | Disposition: A | Discharge status: Home / Self Care | Payer: 59 | Attending: Obstetrics and Gynecology | Admitting: Obstetrics and Gynecology

## 2023-07-04 DIAGNOSIS — O26891 Other specified pregnancy related conditions, first trimester: Secondary | ICD-10-CM | POA: Insufficient documentation

## 2023-07-04 DIAGNOSIS — Z3A09 9 weeks gestation of pregnancy: Secondary | ICD-10-CM | POA: Diagnosis not present

## 2023-07-04 DIAGNOSIS — R109 Unspecified abdominal pain: Secondary | ICD-10-CM | POA: Diagnosis not present

## 2023-07-04 DIAGNOSIS — O26899 Other specified pregnancy related conditions, unspecified trimester: Secondary | ICD-10-CM

## 2023-07-04 DIAGNOSIS — W19XXXA Unspecified fall, initial encounter: Secondary | ICD-10-CM

## 2023-07-04 NOTE — MAU Provider Note (Signed)
History     CSN: [id number]  Arrival date and time: 07/04/23 0011   None     Chief Complaint  Patient presents with   Abdominal Pain   HPI Catherine [name] is a 22 y.o G1P0 at [redacted]w[redacted]d who presents to MAU for abdominal pain. She reports at 1030pm she slipped and hit her abdomen around her belly button on her dog's cage. She reports soon after started having some cramping. She has not taken anything to relieve pain. She denies vaginal bleeding or discharge.   OB History     Gravida  1   Para  0   Term  0   Preterm  0   AB  0   Living  0      SAB  0   IAB  0   Ectopic  0   Multiple  0   Live Births  0           Past Medical History:  Diagnosis Date   Allergy    Asthma    past hx   Blanching rash 10/19/2022   Family history of brain aneurysm- father died around age 58  2020/04/06   High risk heterosexual behavior 2020-04-06   History of anemia 06-Apr-2020   Migraine    Pelvic pain Apr 06, 2020   Polyuria 2020-04-06   Right lower quadrant pain 2020-04-06    Past Surgical History:  Procedure Laterality Date   TONSILLECTOMY Bilateral 08/11/2015   Procedure: CONTROL POST TONSILLECTOMY BLEEDING ;  Surgeon: Vernie Murders, MD;  Location: [facility];  Service: ENT;  Laterality: Bilateral;   TONSILLECTOMY AND ADENOIDECTOMY N/A 07/31/2015   Procedure: TONSILLECTOMY AND ADENOIDECTOMY;  Surgeon: Linus Salmons, MD;  Location: [facility];  Service: ENT;  Laterality: N/A;    Family History  Problem Relation Age of Onset   Hypertension Father    Aneurysm Father    Hypertension Brother    Diabetes Brother    Breast cancer Maternal Grandmother    Cancer Maternal Grandmother    Lung disease Maternal Grandmother    Bipolar disorder Mother    Depression Mother    Cancer Mother    Schizophrenia Mother    Aneurysm Paternal Grandfather    Heart attack Maternal Grandfather     Social History   Tobacco Use   Smoking status: Never    Passive exposure: Never    Smokeless tobacco: Never  Vaping Use   Vaping status: Never Used  Substance Use Topics   Alcohol use: Yes    Comment: once per month, 1-2 drinks   Drug use: Not Currently    Types: Marijuana    Allergies:  Allergies  Allergen Reactions   Amoxicillin Hives    Medications Prior to Admission  Medication Sig Dispense Refill Last Dose   Cholecalciferol (VITAMIN D) 50 MCG (2000 UT) tablet Take 1 tablet (2,000 Units total) by mouth daily. 90 tablet 1    escitalopram (LEXAPRO) 20 MG tablet Take 1 tablet (20 mg total) by mouth daily. 90 tablet 3    hydrOXYzine (ATARAX) 25 MG tablet Take 1 tablet (25 mg total) by mouth 3 (three) times daily as needed. 30 tablet 2    ipratropium (ATROVENT) 0.06 % nasal spray Place 2 sprays into both nostrils 4 (four) times daily. 15 mL 12    metroNIDAZOLE (FLAGYL) 500 MG tablet Take 1 tablet (500 mg total) by mouth 2 (two) times daily. 14 tablet 0    Multiple Vitamins-Minerals (MULTIVITAMIN WITH MINERALS) tablet  Take 1 tablet by mouth daily. 100 tablet 0    ondansetron (ZOFRAN-ODT) 4 MG disintegrating tablet Take 1 tablet (4 mg total) by mouth every 8 (eight) hours as needed for nausea or vomiting. 30 tablet 0    triamcinolone ointment (KENALOG) 0.5 % Apply 1 Application topically 2 (two) times daily. Apply to areas of rash. 30 g 1    Review of Systems  Gastrointestinal:  Positive for abdominal pain.  All other systems reviewed and are negative.  Physical Exam   Blood pressure 128/71, pulse 76, temperature 98.1 F (36.7 C), temperature source Oral, resp. rate 18, height 5\' 3"  (1.6 m), weight 115.8 kg, last menstrual period 04/29/2023, SpO2 100%.  Physical Exam Vitals and nursing note reviewed.  Constitutional:      General: She is not in acute distress.    Appearance: She is obese.  Cardiovascular:     Rate and Rhythm: Normal rate.  Pulmonary:     Effort: Pulmonary effort is normal. No respiratory distress.  Abdominal:     Palpations: Abdomen is  soft.     Tenderness: There is no abdominal tenderness.  Neurological:     General: No focal deficit present.     Mental Status: She is alert and oriented to person, place, and time.  Psychiatric:        Mood and Affect: Mood normal.        Behavior: Behavior normal.      MAU Course  Procedures  MDM  Pt informed that the ultrasound is considered a limited OB ultrasound and is not intended to be a complete ultrasound exam.  Patient also informed that the ultrasound is not being completed with the intent of assessing for fetal or placental anomalies or any pelvic abnormalities.  Explained that the purpose of today's ultrasound is to assess for  viability.  Patient acknowledges the purpose of the exam and the limitations of the study. FHR: 160   Declines pain medication  Assessment and Plan   1. [redacted] weeks gestation of pregnancy   2. Fall, initial encounter   3. Abdominal pain affecting pregnancy    - Discharge home in stable condition - Return precautions given. Return to MAU as needed - Establish prenatal care  Brand Males, CNM 07/04/2023, 2:52 AM

## 2023-07-04 NOTE — MAU Note (Signed)
.  Catherine [name] is a 22 y.o. at [redacted]w[redacted]d here in MAU reporting: @ 2230 slipped and fell on dog cage on her ABD, pt has lacerations on her wrist, and after pt reports ABD cramping, tightness and back pain. Pt denies any lightheadedness or dizziness prior or after. Pt denies taking pain medications. Pt denies VB or LOF.  PNC not yet started  Onset of complaint: 2230 Pain score: 7/10 Vitals:   07/04/23 0158  BP: 128/71  Pulse: 76  Resp: 18  Temp: 98.1 F (36.7 C)  SpO2: 100%     Lab orders placed from triage:  UA

## 2023-07-10 ENCOUNTER — Other Ambulatory Visit: Payer: Self-pay | Admitting: *Deleted

## 2023-07-12 DIAGNOSIS — O0993 Supervision of high risk pregnancy, unspecified, third trimester: Secondary | ICD-10-CM | POA: Insufficient documentation

## 2023-07-12 DIAGNOSIS — Z3402 Encounter for supervision of normal first pregnancy, second trimester: Secondary | ICD-10-CM | POA: Insufficient documentation

## 2023-07-13 DIAGNOSIS — O0991 Supervision of high risk pregnancy, unspecified, first trimester: Secondary | ICD-10-CM | POA: Diagnosis not present

## 2023-07-13 LAB — OB RESULTS CONSOLE RUBELLA ANTIBODY, IGM: Rubella: IMMUNE

## 2023-07-13 LAB — OB RESULTS CONSOLE HEPATITIS B SURFACE ANTIGEN: Hepatitis B Surface Ag: NEGATIVE

## 2023-07-13 LAB — HEPATITIS C ANTIBODY: HCV Ab: NEGATIVE

## 2023-07-13 LAB — OB RESULTS CONSOLE HIV ANTIBODY (ROUTINE TESTING): HIV: NONREACTIVE

## 2023-07-27 ENCOUNTER — Institutional Professional Consult (permissible substitution): Payer: Medicaid Other | Admitting: Licensed Clinical Social Worker

## 2023-07-27 ENCOUNTER — Encounter: Payer: 59 | Admitting: Family Medicine

## 2023-08-10 DIAGNOSIS — N898 Other specified noninflammatory disorders of vagina: Secondary | ICD-10-CM | POA: Diagnosis not present

## 2023-08-10 DIAGNOSIS — O0991 Supervision of high risk pregnancy, unspecified, first trimester: Secondary | ICD-10-CM | POA: Diagnosis not present

## 2023-08-14 ENCOUNTER — Other Ambulatory Visit: Payer: Self-pay | Admitting: Certified Nurse Midwife

## 2023-08-14 DIAGNOSIS — O99210 Obesity complicating pregnancy, unspecified trimester: Secondary | ICD-10-CM

## 2023-08-14 DIAGNOSIS — O0991 Supervision of high risk pregnancy, unspecified, first trimester: Secondary | ICD-10-CM

## 2023-09-07 DIAGNOSIS — O99210 Obesity complicating pregnancy, unspecified trimester: Secondary | ICD-10-CM | POA: Diagnosis not present

## 2023-09-07 DIAGNOSIS — O1212 Gestational proteinuria, second trimester: Secondary | ICD-10-CM | POA: Diagnosis not present

## 2023-09-14 ENCOUNTER — Other Ambulatory Visit: Payer: Self-pay | Admitting: *Deleted

## 2023-09-14 ENCOUNTER — Ambulatory Visit: Payer: 59 | Admitting: *Deleted

## 2023-09-14 ENCOUNTER — Ambulatory Visit: Payer: 59 | Attending: Certified Nurse Midwife

## 2023-09-14 VITALS — BP 129/78 | HR 85

## 2023-09-14 DIAGNOSIS — O99210 Obesity complicating pregnancy, unspecified trimester: Secondary | ICD-10-CM | POA: Diagnosis not present

## 2023-09-14 DIAGNOSIS — O99212 Obesity complicating pregnancy, second trimester: Secondary | ICD-10-CM

## 2023-09-14 DIAGNOSIS — Z3A19 19 weeks gestation of pregnancy: Secondary | ICD-10-CM

## 2023-09-14 DIAGNOSIS — O0991 Supervision of high risk pregnancy, unspecified, first trimester: Secondary | ICD-10-CM | POA: Diagnosis not present

## 2023-09-14 DIAGNOSIS — E669 Obesity, unspecified: Secondary | ICD-10-CM | POA: Diagnosis not present

## 2023-09-14 DIAGNOSIS — Z362 Encounter for other antenatal screening follow-up: Secondary | ICD-10-CM

## 2023-10-05 DIAGNOSIS — Z2839 Supervision of other high risk pregnancies, unspecified trimester: Secondary | ICD-10-CM

## 2023-10-05 DIAGNOSIS — F419 Anxiety disorder, unspecified: Secondary | ICD-10-CM | POA: Insufficient documentation

## 2023-10-05 DIAGNOSIS — Z23 Encounter for immunization: Secondary | ICD-10-CM | POA: Diagnosis not present

## 2023-10-05 DIAGNOSIS — O09899 Supervision of other high risk pregnancies, unspecified trimester: Secondary | ICD-10-CM

## 2023-10-05 HISTORY — DX: Supervision of other high risk pregnancies, unspecified trimester: Z28.39

## 2023-10-05 HISTORY — DX: Supervision of other high risk pregnancies, unspecified trimester: O09.899

## 2023-10-19 ENCOUNTER — Ambulatory Visit: Payer: 59 | Attending: Obstetrics and Gynecology

## 2023-10-19 ENCOUNTER — Other Ambulatory Visit: Payer: Self-pay | Admitting: *Deleted

## 2023-10-19 ENCOUNTER — Other Ambulatory Visit: Payer: Self-pay

## 2023-10-19 DIAGNOSIS — O99212 Obesity complicating pregnancy, second trimester: Secondary | ICD-10-CM | POA: Insufficient documentation

## 2023-10-19 DIAGNOSIS — Z3A24 24 weeks gestation of pregnancy: Secondary | ICD-10-CM | POA: Diagnosis not present

## 2023-10-19 DIAGNOSIS — Z362 Encounter for other antenatal screening follow-up: Secondary | ICD-10-CM | POA: Diagnosis not present

## 2023-10-19 DIAGNOSIS — E669 Obesity, unspecified: Secondary | ICD-10-CM

## 2023-10-20 ENCOUNTER — Inpatient Hospital Stay (HOSPITAL_COMMUNITY)
Admission: AD | Admit: 2023-10-20 | Discharge: 2023-10-20 | Disposition: A | Discharge status: Home / Self Care | Payer: 59 | Attending: Obstetrics and Gynecology | Admitting: Obstetrics and Gynecology

## 2023-10-20 ENCOUNTER — Encounter (HOSPITAL_COMMUNITY): Payer: Self-pay | Admitting: Obstetrics and Gynecology

## 2023-10-20 DIAGNOSIS — R42 Dizziness and giddiness: Secondary | ICD-10-CM | POA: Diagnosis not present

## 2023-10-20 DIAGNOSIS — O26892 Other specified pregnancy related conditions, second trimester: Secondary | ICD-10-CM | POA: Insufficient documentation

## 2023-10-20 DIAGNOSIS — Z3A24 24 weeks gestation of pregnancy: Secondary | ICD-10-CM | POA: Insufficient documentation

## 2023-10-20 DIAGNOSIS — R030 Elevated blood-pressure reading, without diagnosis of hypertension: Secondary | ICD-10-CM | POA: Diagnosis not present

## 2023-10-20 DIAGNOSIS — Z3689 Encounter for other specified antenatal screening: Secondary | ICD-10-CM | POA: Insufficient documentation

## 2023-10-20 DIAGNOSIS — R55 Syncope and collapse: Secondary | ICD-10-CM | POA: Diagnosis not present

## 2023-10-20 LAB — COMPREHENSIVE METABOLIC PANEL
ALT: 18 U/L (ref 0–44)
AST: 12 U/L — ABNORMAL LOW (ref 15–41)
Albumin: 2.6 g/dL — ABNORMAL LOW (ref 3.5–5.0)
Alkaline Phosphatase: 69 U/L (ref 38–126)
Anion gap: 6 (ref 5–15)
BUN: 6 mg/dL (ref 6–20)
CO2: 23 mmol/L (ref 22–32)
Calcium: 8.8 mg/dL — ABNORMAL LOW (ref 8.9–10.3)
Chloride: 107 mmol/L (ref 98–111)
Creatinine, Ser: 0.5 mg/dL (ref 0.44–1.00)
GFR, Estimated: >60 mL/min (ref >60)
Glucose, Bld: 93 mg/dL (ref 70–99)
Potassium: 3.7 mmol/L (ref 3.5–5.1)
Sodium: 136 mmol/L (ref 135–145)
Total Bilirubin: <0.2 mg/dL (ref <1.2)
Total Protein: 5.9 g/dL — ABNORMAL LOW (ref 6.5–8.1)

## 2023-10-20 LAB — URINALYSIS, ROUTINE W REFLEX MICROSCOPIC
Bilirubin Urine: NEGATIVE
Glucose, UA: NEGATIVE mg/dL
Hgb urine dipstick: NEGATIVE
Ketones, ur: NEGATIVE mg/dL
Leukocytes,Ua: NEGATIVE
Nitrite: NEGATIVE
Protein, ur: NEGATIVE mg/dL
Specific Gravity, Urine: 1.014 (ref 1.005–1.030)
pH: 7 (ref 5.0–8.0)

## 2023-10-20 LAB — CBC
HCT: 33.3 % — ABNORMAL LOW (ref 36.0–46.0)
Hemoglobin: 10.8 g/dL — ABNORMAL LOW (ref 12.0–15.0)
MCH: 25.1 pg — ABNORMAL LOW (ref 26.0–34.0)
MCHC: 32.4 g/dL (ref 30.0–36.0)
MCV: 77.4 fL — ABNORMAL LOW (ref 80.0–100.0)
Platelets: 409 10*3/uL — ABNORMAL HIGH (ref 150–400)
RBC: 4.3 MIL/uL (ref 3.87–5.11)
RDW: 15.4 % (ref 11.5–15.5)
WBC: 14.3 10*3/uL — ABNORMAL HIGH (ref 4.0–10.5)
nRBC: 0 % (ref 0.0–0.2)

## 2023-10-20 LAB — PROTEIN / CREATININE RATIO, URINE
Creatinine, Urine: 83 mg/dL
Protein Creatinine Ratio: 0.08 mg/mg{creat} (ref 0.00–0.15)
Total Protein, Urine: 7 mg/dL

## 2023-10-20 LAB — GLUCOSE, CAPILLARY: Glucose-Capillary: 85 mg/dL (ref 70–99)

## 2023-10-20 NOTE — MAU Note (Signed)
Pt says she was laying down at 6 pm- suddenly she felt like she couldn't breathe- She sat up-still couldn't breathe good- Then thought she might pass out.  On way here- in car- head felt cloudy. Now- feels difficulty breathing - but not as bad.  Head still feels cloudy and waves of dizziness.  PNC- [facility]

## 2023-10-20 NOTE — MAU Provider Note (Signed)
History     CSN: [id number]  Arrival date and time: 10/20/23 7829   Event Date/Time   First Provider Initiated Contact with Patient 10/20/23 2123      No chief complaint on file.   Catherine [name] is a 22 y.o. G1P0 at [redacted]w[redacted]d who receives care at [place].  Patient reports her next appt is Nov 09, 2023. She presents today for near syncope.  She states she was laying down and had sudden feeling of SOB.  She states she sat up and tried to take deep breaths, but felt like she was going to pass out.  She states she has "been feeling weird all day . . . Like my head felt fuzzy and cloudy."  She also reports "waves of dizziness.  She denies history of HTN during pregnancy, but notes that her bp would "fluctuate" prior to pregnancy.   Patient endorses fetal movement. She reports having one BH contraction earlier today, but no other abdominal cramping or contractions.    Breakfast: 3 Oranges  Lunch: Donut  Dinner: Chipotle  OB History     Gravida  1   Para  0   Term  0   Preterm  0   AB  0   Living  0      SAB  0   IAB  0   Ectopic  0   Multiple  0   Live Births  0           Past Medical History:  Diagnosis Date   Allergic rhinitis 04/01/2010   Qualifier: Diagnosis of   By: Severiano Gilbert MD, Lori         Allergy    Asthma    past hx   Blanching rash 10/19/2022   Family history of brain aneurysm- father died around age 14  Apr 04, 2020   Health care maintenance Apr 04, 2020   Care Gaps     High risk heterosexual behavior 2020/04/04   History of anemia 04/04/2020   Menorrhagia with irregular cycle 04-04-20   Migraine    Pelvic pain 04-Apr-2020   Polyuria 2020/04/04   Right lower quadrant pain Apr 04, 2020    Past Surgical History:  Procedure Laterality Date   TONSILLECTOMY Bilateral 08/11/2015   Procedure: CONTROL POST TONSILLECTOMY BLEEDING ;  Surgeon: Vernie Murders, MD;  Location: [facility] OR[facility];  Service: ENT;  Laterality: Bilateral;   TONSILLECTOMY AND ADENOIDECTOMY  N/A 07/31/2015   Procedure: TONSILLECTOMY AND ADENOIDECTOMY;  Surgeon: Linus Salmons, MD;  Location: [facility];  Service: ENT;  Laterality: N/A;   WISDOM TOOTH EXTRACTION      Family History  Problem Relation Age of Onset   Heart disease Mother    Bipolar disorder Mother    Depression Mother    Cancer Mother    Schizophrenia Mother    Asthma Father    Hypertension Father    Aneurysm Father    Hypertension Brother    Diabetes Brother    Breast cancer Maternal Grandmother    Cancer Maternal Grandmother    Lung disease Maternal Grandmother    Heart disease Maternal Grandfather    Heart attack Maternal Grandfather    Aneurysm Paternal Grandfather     Social History   Tobacco Use   Smoking status: Never    Passive exposure: Never   Smokeless tobacco: Never  Vaping Use   Vaping status: Never Used  Substance Use Topics   Alcohol use: Not Currently    Comment: once per month, 1-2 drinks  Drug use: Not Currently    Types: Marijuana    Comment: last use feb 2024    Allergies:  Allergies  Allergen Reactions   Amoxicillin Hives    Medications Prior to Admission  Medication Sig Dispense Refill Last Dose   aspirin EC 81 MG tablet Take 81 mg by mouth daily. Swallow whole.   10/19/2023   prenatal vitamin w/FE, FA (PRENATAL 1 + 1) 27-1 MG TABS tablet Take 1 tablet by mouth daily at 12 noon.   10/19/2023   escitalopram (LEXAPRO) 20 MG tablet Take 1 tablet (20 mg total) by mouth daily. (Patient not taking: Reported on 09/14/2023) 90 tablet 3     Review of Systems  Gastrointestinal:  Positive for nausea (None currently). Negative for abdominal pain, diarrhea and vomiting.  Genitourinary:  Negative for difficulty urinating, dysuria, vaginal bleeding and vaginal discharge.  Musculoskeletal:  Negative for back pain.   Physical Exam   Blood pressure (!) 144/82, pulse 94, temperature 97.9 F (36.6 C), temperature source Oral, resp. rate 14, height 5\' 3"  (1.6 m),  weight 118.9 kg, last menstrual period 04/29/2023, SpO2 100%.  Vitals:   10/20/23 2050 10/20/23 2052 10/20/23 2100 10/20/23 2115  BP: (!) 148/85 (!) 144/80 (!) 143/81 (!) 144/82  Pulse: 89 86 88 94  Resp:      Temp:      TempSrc:      SpO2: 100%  99% 100%  Weight:      Height:         Physical Exam Vitals and nursing note reviewed.  Constitutional:      Appearance: Normal appearance.  HENT:     Head: Normocephalic and atraumatic.  Eyes:     Conjunctiva/sclera: Conjunctivae normal.  Cardiovascular:     Rate and Rhythm: Normal rate.     Pulses: Normal pulses.  Pulmonary:     Effort: Pulmonary effort is normal. No respiratory distress.  Abdominal:     Palpations: Abdomen is soft.     Tenderness: There is no abdominal tenderness.     Comments: Gravid, Appears LGA  Musculoskeletal:     Cervical back: Normal range of motion.  Skin:    General: Skin is warm and dry.  Neurological:     Mental Status: She is alert and oriented to person, place, and time.  Psychiatric:        Mood and Affect: Mood normal.        Behavior: Behavior normal.     Fetal Assessment 145 bpm, Mod Var, -Decels, +Accels Toco: No ctx  MAU Course   Results for orders placed or performed during the hospital encounter of 10/20/23 (from the past 24 hour(s))  Urinalysis, Routine w reflex microscopic -Urine, Clean Catch     Status: Abnormal   Collection Time: 10/20/23  8:41 PM  Result Value Ref Range   Color, Urine YELLOW YELLOW   APPearance HAZY (A) CLEAR   Specific Gravity, Urine 1.014 1.005 - 1.030   pH 7.0 5.0 - 8.0   Glucose, UA NEGATIVE NEGATIVE mg/dL   Hgb urine dipstick NEGATIVE NEGATIVE   Bilirubin Urine NEGATIVE NEGATIVE   Ketones, ur NEGATIVE NEGATIVE mg/dL   Protein, ur NEGATIVE NEGATIVE mg/dL   Nitrite NEGATIVE NEGATIVE   Leukocytes,Ua NEGATIVE NEGATIVE  Protein / creatinine ratio, urine     Status: None   Collection Time: 10/20/23  8:41 PM  Result Value Ref Range   Creatinine,  Urine 83 mg/dL   Total Protein, Urine 7 mg/dL  Protein Creatinine Ratio 0.08 0.00 - 0.15 mg/mg[Cre]  Glucose, capillary     Status: None   Collection Time: 10/20/23  8:45 PM  Result Value Ref Range   Glucose-Capillary 85 70 - 99 mg/dL  CBC     Status: Abnormal   Collection Time: 10/20/23  9:11 PM  Result Value Ref Range   WBC 14.3 (H) 4.0 - 10.5 K/uL   RBC 4.30 3.87 - 5.11 MIL/uL   Hemoglobin 10.8 (L) 12.0 - 15.0 g/dL   HCT 41.6 (L) 60.6 - 30.1 %   MCV 77.4 (L) 80.0 - 100.0 fL   MCH 25.1 (L) 26.0 - 34.0 pg   MCHC 32.4 30.0 - 36.0 g/dL   RDW 60.1 09.3 - 23.5 %   Platelets 409 (H) 150 - 400 K/uL   nRBC 0.0 0.0 - 0.2 %  Comprehensive metabolic panel     Status: Abnormal   Collection Time: 10/20/23  9:11 PM  Result Value Ref Range   Sodium 136 135 - 145 mmol/L   Potassium 3.7 3.5 - 5.1 mmol/L   Chloride 107 98 - 111 mmol/L   CO2 23 22 - 32 mmol/L   Glucose, Bld 93 70 - 99 mg/dL   BUN 6 6 - 20 mg/dL   Creatinine, Ser 5.73 0.44 - 1.00 mg/dL   Calcium 8.8 (L) 8.9 - 10.3 mg/dL   Total Protein 5.9 (L) 6.5 - 8.1 g/dL   Albumin 2.6 (L) 3.5 - 5.0 g/dL   AST 12 (L) 15 - 41 U/L   ALT 18 0 - 44 U/L   Alkaline Phosphatase 69 38 - 126 U/L   Total Bilirubin <0.2 <1.2 mg/dL   GFR, Estimated >22 >02 mL/min   Anion gap 6 5 - 15   No results found.  MDM PE Labs: Physical Exam Labs: UA, CBC, CMP, PC Ratio, CBG Measure BPQ15 min EFM  Assessment and Plan  22 year old G1P0  SIUP at 24.6 weeks Cat I FT Near Syncope  -POC Reviewed. -Exam performed. -CBG collected and normal. -Discussed adequate nutritional intake during pregnancy including high protein snacks and limited sugars. -Patient questions appropriateness of protein shakes and informed okay in moderation and should not be used for meal substitution.  -Informed of elevated bps and labs ordered. -NST reassuring/reactive for GA. -Continue to monitor and await results.    Cherre Robins MSN, CNM 10/20/2023, 9:24 PM    Reassessment (10:15 PM) Vitals:   10/20/23 2125 10/20/23 2130 10/20/23 2146 10/20/23 2216  BP:   135/81 121/76  Pulse:   90 90  Resp:      Temp:      TempSrc:      SpO2: 99% 99%    Weight:      Height:        -Labs return as above. -Patient informed that labs are normal and bps normotensive. -Patient instructed to contact office, next week, to schedule bp check. -PreE symptoms reviewed. -Encouraged to call primary office or return to MAU if symptoms worsen or with the onset of new symptoms. -Discharged to home in stable condition.  Cherre Robins MSN, CNM Advanced Practice Provider, [facility] [employer]

## 2023-10-26 DIAGNOSIS — R399 Unspecified symptoms and signs involving the genitourinary system: Secondary | ICD-10-CM | POA: Diagnosis not present

## 2023-10-31 ENCOUNTER — Observation Stay
Admission: EM | Admit: 2023-10-31 | Discharge: 2023-10-31 | Disposition: A | Discharge status: Home / Self Care | Payer: 59 | Attending: Obstetrics and Gynecology | Admitting: Obstetrics and Gynecology

## 2023-10-31 ENCOUNTER — Observation Stay: Payer: 59

## 2023-10-31 ENCOUNTER — Encounter: Payer: Self-pay | Admitting: Obstetrics and Gynecology

## 2023-10-31 ENCOUNTER — Other Ambulatory Visit: Payer: Self-pay

## 2023-10-31 DIAGNOSIS — M549 Dorsalgia, unspecified: Secondary | ICD-10-CM | POA: Diagnosis not present

## 2023-10-31 DIAGNOSIS — I4940 Unspecified premature depolarization: Secondary | ICD-10-CM | POA: Diagnosis not present

## 2023-10-31 DIAGNOSIS — R102 Pelvic and perineal pain: Secondary | ICD-10-CM | POA: Diagnosis not present

## 2023-10-31 DIAGNOSIS — O0992 Supervision of high risk pregnancy, unspecified, second trimester: Secondary | ICD-10-CM | POA: Diagnosis not present

## 2023-10-31 DIAGNOSIS — O26899 Other specified pregnancy related conditions, unspecified trimester: Secondary | ICD-10-CM | POA: Diagnosis present

## 2023-10-31 DIAGNOSIS — O99891 Other specified diseases and conditions complicating pregnancy: Secondary | ICD-10-CM | POA: Diagnosis not present

## 2023-10-31 DIAGNOSIS — O99413 Diseases of the circulatory system complicating pregnancy, third trimester: Secondary | ICD-10-CM | POA: Diagnosis not present

## 2023-10-31 DIAGNOSIS — Z3A29 29 weeks gestation of pregnancy: Secondary | ICD-10-CM | POA: Diagnosis not present

## 2023-10-31 DIAGNOSIS — M545 Low back pain, unspecified: Secondary | ICD-10-CM | POA: Diagnosis not present

## 2023-10-31 DIAGNOSIS — O26892 Other specified pregnancy related conditions, second trimester: Secondary | ICD-10-CM | POA: Diagnosis present

## 2023-10-31 DIAGNOSIS — Z3A26 26 weeks gestation of pregnancy: Secondary | ICD-10-CM | POA: Insufficient documentation

## 2023-10-31 HISTORY — DX: Depression, unspecified: F32.A

## 2023-10-31 HISTORY — DX: Anxiety disorder, unspecified: F41.9

## 2023-10-31 LAB — URINALYSIS, COMPLETE (UACMP) WITH MICROSCOPIC
Bilirubin Urine: NEGATIVE
Glucose, UA: NEGATIVE mg/dL
Hgb urine dipstick: NEGATIVE
Ketones, ur: NEGATIVE mg/dL
Leukocytes,Ua: NEGATIVE
Nitrite: NEGATIVE
Protein, ur: NEGATIVE mg/dL
RBC / HPF: 0 RBC/hpf (ref 0–5)
Specific Gravity, Urine: 1.008 (ref 1.005–1.030)
pH: 6 (ref 5.0–8.0)

## 2023-10-31 LAB — CHLAMYDIA/NGC RT PCR (ARMC ONLY)
Chlamydia Tr: NOT DETECTED
N gonorrhoeae: NOT DETECTED

## 2023-10-31 LAB — URINE DRUG SCREEN, QUALITATIVE (ARMC ONLY)
Amphetamines, Ur Screen: NOT DETECTED
Barbiturates, Ur Screen: NOT DETECTED
Benzodiazepine, Ur Scrn: NOT DETECTED
Cannabinoid 50 Ng, Ur ~~LOC~~: NOT DETECTED
Cocaine Metabolite,Ur ~~LOC~~: NOT DETECTED
MDMA (Ecstasy)Ur Screen: NOT DETECTED
Methadone Scn, Ur: NOT DETECTED
Opiate, Ur Screen: NOT DETECTED
Phencyclidine (PCP) Ur S: NOT DETECTED
Tricyclic, Ur Screen: NOT DETECTED

## 2023-10-31 LAB — WET PREP, GENITAL
Clue Cells Wet Prep HPF POC: NONE SEEN
Sperm: NONE SEEN
Trich, Wet Prep: NONE SEEN
WBC, Wet Prep HPF POC: >=10 — AB (ref <10)
Yeast Wet Prep HPF POC: NONE SEEN

## 2023-10-31 LAB — FETAL FIBRONECTIN: Fetal Fibronectin: NEGATIVE

## 2023-10-31 MED ORDER — TERBUTALINE SULFATE 1 MG/ML IJ SOLN: 0.2500 mg | Freq: Once | INTRAMUSCULAR | Status: DC | PRN

## 2023-10-31 MED ORDER — ACETAMINOPHEN 500 MG PO TABS
1000.0000 mg | ORAL_TABLET | Freq: Four times a day (QID) | ORAL | Status: DC | PRN
  Administered 2023-10-31: 1000 mg via ORAL
  Filled 2023-10-31: qty 2

## 2023-10-31 MED ORDER — CALCIUM CARBONATE ANTACID 500 MG PO CHEW: 2.0000 | CHEWABLE_TABLET | ORAL | Status: DC | PRN

## 2023-10-31 NOTE — OB Triage Note (Signed)
Pt reports to L&D with complaints of contraction like pain. They started with morning around 7:14 and they were happening around 5-8 minutes. She started feeling dizzy and nauseous. She has not taken any medications (I.e., tylenol or tums). The pain feels like really bad period cramps and a sharp ache in her back. She rates the pain 8/10, but it varies in intensity. Denies vaginal bleeding. Has had increased clear discharge - "it happens randomly." Baby is moving more than normal. Pt denies sexual intercourse within the past 24 hours. EFM applied assessing.

## 2023-10-31 NOTE — OB Triage Note (Signed)

## 2023-10-31 NOTE — Discharge Summary (Signed)
Catherine [name] is a 22 y.o. female. She is at [redacted]w[redacted]d gestation. Patient's last menstrual period was 04/29/2023 (exact date). 02/03/2024, by Ultrasound   Prenatal care site: [facility]  Chief complaint: lower back pain   Admission Diagnoses:  1) intrauterine pregnancy at [redacted]w[redacted]d  2) Back pain affecting pregnancy [O99.891, M54.9]  Discharge Diagnoses:  Principal Problem:   Back pain affecting pregnancy Active Problems:   Cramping affecting pregnancy, antepartum   HPI: Catherine [name] presents to L&D with complaints of lower back pain and menstrual like cramping. She first noticed her symptoms yesterday after work. States that she had mild cramping that wrapped around to her back. Symptoms improved after she was able to rest and sit for awhile. She started having back pain again this morning.  Her pregnancy is complicated by obesity .  She denies Loss of fluid or Vaginal bleeding. States that back pain has improved since she arrived to [facility] triage. Endorses fetal movement as active.   S: Resting comfortably. no CTX, no VB.no LOF,  Active fetal movement.   Maternal Medical History:  Past Medical Hx:  has a past medical history of Allergic rhinitis (04/01/2010), Allergy, Anxiety, Asthma, Blanching rash (10/19/2022), Depression, Family history of brain aneurysm- father died around age 34  (04-23-2020), Health care maintenance (Apr 23, 2020), High risk heterosexual behavior (04-23-20), History of anemia (2020-04-23), Menorrhagia with irregular cycle (04/23/20), Migraine, Pelvic pain (2020/04/23), Polyuria (04/23/2020), and Right lower quadrant pain (Apr 23, 2020).    Past Surgical Hx:  has a past surgical history that includes Tonsillectomy and adenoidectomy (N/A, 07/31/2015); Tonsillectomy (Bilateral, 08/11/2015); and Wisdom tooth extraction.   Allergies  Allergen Reactions   Amoxicillin Hives     Prior to Admission medications   Medication Sig Start Date End Date Taking? Authorizing Provider   aspirin EC 81 MG tablet Take 81 mg by mouth daily. Swallow whole.    [provider]  escitalopram (LEXAPRO) 20 MG tablet Take 1 tablet (20 mg total) by mouth daily. Patient not taking: Reported on 09/14/2023 04/27/23 04/26/24  Morene Crocker, MD  prenatal vitamin w/FE, FA (PRENATAL 1 + 1) 27-1 MG TABS tablet Take 1 tablet by mouth daily at 12 noon.    [provider]    Social History: She  reports that she has never smoked. She has never been exposed to tobacco smoke. She has never used smokeless tobacco. She reports that she does not currently use alcohol. She reports that she does not currently use drugs after having used the following drugs: Marijuana.  Family History: family history includes Aneurysm in her father and paternal grandfather; Asthma in her father; Bipolar disorder in her mother; Breast cancer in her maternal grandmother; Cancer in her maternal grandmother and mother; Depression in her mother; Diabetes in her brother; Heart attack in her maternal grandfather; Heart disease in her maternal grandfather and mother; Hypertension in her brother and father; Lung disease in her maternal grandmother; Schizophrenia in her mother.   Review of Systems: A full review of systems was performed and negative except as noted in the HPI.     Pertinent Results:   O:  BP 124/82   Pulse 89   Temp 97.9 F (36.6 C) (Oral)   Resp 16   Ht 5\' 3"  (1.6 m)   Wt 117 kg   LMP 04/29/2023 (Exact Date)   BMI 45.70 kg/m  Results for orders placed or performed during the hospital encounter of 10/31/23 (from the past 48 hour(s))  Wet prep, genital  Collection Time: 10/31/23  9:39 AM  Result Value Ref Range   Yeast Wet Prep HPF POC NONE SEEN NONE SEEN   Trich, Wet Prep NONE SEEN NONE SEEN   Clue Cells Wet Prep HPF POC NONE SEEN NONE SEEN   WBC, Wet Prep HPF POC >=10 (A) <10   Sperm NONE SEEN   Chlamydia/NGC rt PCR ([facility] only)   Collection Time: 10/31/23  9:39 AM    Specimen: Genital  Result Value Ref Range   Specimen source GC/Chlam ENDOCERVICAL    Chlamydia Tr NOT DETECTED NOT DETECTED   N gonorrhoeae NOT DETECTED NOT DETECTED  Fetal fibronectin   Collection Time: 10/31/23  9:39 AM  Result Value Ref Range   Fetal Fibronectin NEGATIVE NEGATIVE  Urinalysis, Complete w Microscopic -Urine, Clean Catch   Collection Time: 10/31/23  9:39 AM  Result Value Ref Range   Color, Urine YELLOW (A) YELLOW   APPearance HAZY (A) CLEAR   Specific Gravity, Urine 1.008 1.005 - 1.030   pH 6.0 5.0 - 8.0   Glucose, UA NEGATIVE NEGATIVE mg/dL   Hgb urine dipstick NEGATIVE NEGATIVE   Bilirubin Urine NEGATIVE NEGATIVE   Ketones, ur NEGATIVE NEGATIVE mg/dL   Protein, ur NEGATIVE NEGATIVE mg/dL   Nitrite NEGATIVE NEGATIVE   Leukocytes,Ua NEGATIVE NEGATIVE   RBC / HPF 0 0 - 5 RBC/hpf   WBC, UA 0-5 0 - 5 WBC/hpf   Bacteria, UA MANY (A) NONE SEEN   Squamous Epithelial / HPF 0-5 0 - 5 /HPF   Mucus PRESENT   Urine Drug Screen, Qualitative ([facility] only)   Collection Time: 10/31/23  9:39 AM  Result Value Ref Range   Tricyclic, Ur Screen NONE DETECTED NONE DETECTED   Amphetamines, Ur Screen NONE DETECTED NONE DETECTED   MDMA (Ecstasy)Ur Screen NONE DETECTED NONE DETECTED   Cocaine Metabolite,Ur [place] NONE DETECTED NONE DETECTED   Opiate, Ur Screen NONE DETECTED NONE DETECTED   Phencyclidine (PCP) Ur S NONE DETECTED NONE DETECTED   Cannabinoid 50 Ng, Ur  NONE DETECTED NONE DETECTED   Barbiturates, Ur Screen NONE DETECTED NONE DETECTED   Benzodiazepine, Ur Scrn NONE DETECTED NONE DETECTED   Methadone Scn, Ur NONE DETECTED NONE DETECTED     US OB Limited  Result Date: 10/31/2023 CLINICAL DATA:  Preterm contractions EXAM: LIMITED OBSTETRIC ULTRASOUND COMPARISON:  10/19/2023 outside exam FINDINGS: Number of Fetuses: 1 Heart Rate:  157 bpm Movement: Yes Presentation: Cephalic Placental Location: Posterior Previa: No Amniotic Fluid (Subjective):  Subjectively more than  expected. AFI: 21.9 cm, borderline elevated BPD: 7.2 cm 29 w  0 d MATERNAL FINDINGS: Cervix:  Appears closed. Uterus/Adnexae: Poorly image today IMPRESSION: Single live intrauterine pregnancy of 29 weeks and 0 days by today's limited ultrasound. Positive fetal heart motion. AFI is borderline elevated at 21.9 appears subjectively on the higher end. Please correlate with previous fetal survey. Please correlate with clinical presentation and if needed follow up detailed evaluation. This exam is performed on an emergent basis and does not comprehensively evaluate fetal size, dating, or anatomy; follow-up complete OB US should be considered if further fetal assessment is warranted. Electronically Signed   By: Karen Kays M.D.   On: 10/31/2023 12:46     Constitutional: NAD, AAOx3  PULM: nl respiratory effort Abd: gravid, non-tender Ext: Non-tender, Nonedmeatous Psych: mood appropriate, speech normal Pelvic : moderate amount of physiologic discharge SVE: Dilation: Closed Exam by:: A Teyanna Thielman, CNM  -Exam unchanged after > 3 hours    NST: Baseline FHR: 145  beats/min Variability: moderate Accelerations: absent Decelerations: absent  Tocometry: Occasional, mild contractions  Time: at least 20 minutes   Interpretation: Category I INDICATIONS: rule out uterine contractions RESULTS:  A NST procedure was performed with FHR monitoring and a normal baseline established, appropriate time of 20-40 minutes of evaluation, and accels >2 seen w 15x15 characteristics.  Results show a REACTIVE NST.   Consults: None  Procedures: Continuous EFM, OB US   Hospital Course: The patient was admitted to Labor and Delivery Triage for observation. Initial speculum exam was reassuring. No pooling, bleeding, or abnormal discharge noted. Cervix visually closed and long. FFN, wet prep, GC/CT, and UA were negative. Cervical length reassuring. Hatsuko stated her symptoms improved with rest and PO hydration. Repeat speculum exam  was also reassuring and unchanged after > 3 hours. We reviewed PTL precautions and warning s/s to return to L&D with. Recommend pelvic rest for next week until her next prenatal appointment. Work note provided for pregnancy restrictions in late 2nd trimester and early 3rd trimester. Recommend avoiding prolonged standing and ability to sit as needed.  She was deemed stable for discharge and further outpatient management.   Discharge Condition: stable  Disposition: Discharge disposition: 01-Home or Self Care      Allergies as of 10/31/2023       Reactions   Amoxicillin Hives        Medication List     STOP taking these medications    escitalopram 20 MG tablet Commonly known as: Lexapro       TAKE these medications    aspirin EC 81 MG tablet Take 81 mg by mouth daily. Swallow whole.   prenatal vitamin w/FE, FA 27-1 MG Tabs tablet Take 1 tablet by mouth daily at 12 noon.        Follow-up Information     [facility] Follow up on 11/09/2023.   Why: routine prenatal appointment Contact information: [address] [place] [address] [phone]               ----- Gustavo Lah, CNM  Certified Nurse Midwife [facility]

## 2023-11-09 DIAGNOSIS — Z3402 Encounter for supervision of normal first pregnancy, second trimester: Secondary | ICD-10-CM | POA: Diagnosis not present

## 2023-11-09 DIAGNOSIS — R030 Elevated blood-pressure reading, without diagnosis of hypertension: Secondary | ICD-10-CM | POA: Diagnosis not present

## 2023-11-09 DIAGNOSIS — Z23 Encounter for immunization: Secondary | ICD-10-CM | POA: Diagnosis not present

## 2023-11-14 ENCOUNTER — Other Ambulatory Visit: Payer: Self-pay

## 2023-11-14 ENCOUNTER — Observation Stay
Admission: EM | Admit: 2023-11-14 | Discharge: 2023-11-14 | Disposition: A | Discharge status: Home / Self Care | Payer: 59 | Attending: Certified Nurse Midwife | Admitting: Certified Nurse Midwife

## 2023-11-14 ENCOUNTER — Encounter: Payer: Self-pay | Admitting: Obstetrics

## 2023-11-14 DIAGNOSIS — Z79899 Other long term (current) drug therapy: Secondary | ICD-10-CM | POA: Diagnosis not present

## 2023-11-14 DIAGNOSIS — O99513 Diseases of the respiratory system complicating pregnancy, third trimester: Secondary | ICD-10-CM | POA: Diagnosis not present

## 2023-11-14 DIAGNOSIS — O0993 Supervision of high risk pregnancy, unspecified, third trimester: Secondary | ICD-10-CM | POA: Diagnosis not present

## 2023-11-14 DIAGNOSIS — Z3A28 28 weeks gestation of pregnancy: Secondary | ICD-10-CM | POA: Insufficient documentation

## 2023-11-14 DIAGNOSIS — J45909 Unspecified asthma, uncomplicated: Secondary | ICD-10-CM | POA: Diagnosis not present

## 2023-11-14 DIAGNOSIS — N949 Unspecified condition associated with female genital organs and menstrual cycle: Principal | ICD-10-CM | POA: Diagnosis present

## 2023-11-14 DIAGNOSIS — O26893 Other specified pregnancy related conditions, third trimester: Secondary | ICD-10-CM | POA: Diagnosis present

## 2023-11-14 DIAGNOSIS — R1030 Lower abdominal pain, unspecified: Secondary | ICD-10-CM | POA: Diagnosis not present

## 2023-11-14 DIAGNOSIS — Z7982 Long term (current) use of aspirin: Secondary | ICD-10-CM | POA: Diagnosis not present

## 2023-11-14 DIAGNOSIS — O99891 Other specified diseases and conditions complicating pregnancy: Secondary | ICD-10-CM | POA: Diagnosis not present

## 2023-11-14 DIAGNOSIS — R102 Pelvic and perineal pain: Secondary | ICD-10-CM | POA: Diagnosis not present

## 2023-11-14 LAB — URINALYSIS, ROUTINE W REFLEX MICROSCOPIC
Bilirubin Urine: NEGATIVE
Glucose, UA: NEGATIVE mg/dL
Hgb urine dipstick: NEGATIVE
Ketones, ur: NEGATIVE mg/dL
Leukocytes,Ua: NEGATIVE
Nitrite: NEGATIVE
Protein, ur: NEGATIVE mg/dL
Specific Gravity, Urine: 1.008 (ref 1.005–1.030)
pH: 7 (ref 5.0–8.0)

## 2023-11-14 LAB — WET PREP, GENITAL
Clue Cells Wet Prep HPF POC: NONE SEEN
Sperm: NONE SEEN
Trich, Wet Prep: NONE SEEN
WBC, Wet Prep HPF POC: >=10 — AB (ref <10)
Yeast Wet Prep HPF POC: NONE SEEN

## 2023-11-14 NOTE — OB Triage Note (Signed)
Pt is G1P0 at 28.3 weeks with c/o awakening abruptly to a sharp abd cramp on both sides of her lower abd that lasted . Rated pain 10/10. Has had 2 similar episdes since but not as intense. Pt denies any complications during pregnancy. Denies urinary problems. FHR 135

## 2023-11-16 ENCOUNTER — Ambulatory Visit: Payer: 59 | Attending: Obstetrics

## 2023-11-16 ENCOUNTER — Other Ambulatory Visit: Payer: Self-pay | Admitting: *Deleted

## 2023-11-16 DIAGNOSIS — O99213 Obesity complicating pregnancy, third trimester: Secondary | ICD-10-CM

## 2023-11-16 DIAGNOSIS — O99212 Obesity complicating pregnancy, second trimester: Secondary | ICD-10-CM | POA: Diagnosis not present

## 2023-11-16 DIAGNOSIS — E669 Obesity, unspecified: Secondary | ICD-10-CM

## 2023-11-16 DIAGNOSIS — Z3A28 28 weeks gestation of pregnancy: Secondary | ICD-10-CM

## 2023-11-17 NOTE — Discharge Summary (Signed)
Catherine [name] is a 22 y.o. female. She is at [redacted]w[redacted]d gestation. Patient's last menstrual period was 04/29/2023 (exact date). Estimated Date of Delivery: 02/03/24  Prenatal care site: [facility]    Current pregnancy complicated by:  - obesity - anxiety - hx of migraines - elevated protein in NOB urine - varicella non-immune  Chief complaint: lower abdominal/pelvic pain, lasted 10 minutes that resolved, the pain was 10/10 when it occurred. None since. Endorses good fetal movement, denies bleeding or leaking fluid.  S: Resting comfortably. no CTX, no VB.no LOF,  Active fetal movement.  Denies: HA, visual changes, SOB, or RUQ/epigastric pain  Maternal Medical History:   Past Medical History:  Diagnosis Date   Allergic rhinitis 04/01/2010   Qualifier: Diagnosis of   By: Severiano Gilbert MD, Lori         Allergy    Anxiety    Asthma    past hx   Blanching rash 10/19/2022   Depression    Family history of brain aneurysm- father died around age 34  05-02-20   Health care maintenance 2020-05-02   Care Gaps     High risk heterosexual behavior 02-May-2020   History of anemia 05-02-20   Menorrhagia with irregular cycle 05/02/20   Migraine    Pelvic pain 02-May-2020   Polyuria 05/02/2020   Right lower quadrant pain 2020/05/02    Past Surgical History:  Procedure Laterality Date   TONSILLECTOMY Bilateral 08/11/2015   Procedure: CONTROL POST TONSILLECTOMY BLEEDING ;  Surgeon: Vernie Murders, MD;  Location: [facility] ORS;  Service: ENT;  Laterality: Bilateral;   TONSILLECTOMY AND ADENOIDECTOMY N/A 07/31/2015   Procedure: TONSILLECTOMY AND ADENOIDECTOMY;  Surgeon: Linus Salmons, MD;  Location: [facility];  Service: ENT;  Laterality: N/A;   WISDOM TOOTH EXTRACTION      Allergies  Allergen Reactions   Amoxicillin Hives    Prior to Admission medications   Medication Sig Start Date End Date Taking? Authorizing Provider  aspirin EC 81 MG tablet Take 81 mg by mouth daily.  Swallow whole.    [provider]  prenatal vitamin w/FE, FA (PRENATAL 1 + 1) 27-1 MG TABS tablet Take 1 tablet by mouth daily at 12 noon.    [provider]      Social History: She  reports that she has never smoked. She has never been exposed to tobacco smoke. She has never used smokeless tobacco. She reports that she does not currently use alcohol. She reports that she does not currently use drugs after having used the following drugs: Marijuana.  Family History: family history includes Aneurysm in her father and paternal grandfather; Asthma in her father; Bipolar disorder in her mother; Breast cancer in her maternal grandmother; Cancer in her maternal grandmother and mother; Depression in her mother; Diabetes in her brother; Heart attack in her maternal grandfather; Heart disease in her maternal grandfather and mother; Hypertension in her brother and father; Lung disease in her maternal grandmother; Schizophrenia in her mother.  no history of gyn cancers  Review of Systems: A full review of systems was performed and negative except as noted in the HPI.     O:  BP 129/75   Pulse 85   Temp 98.2 F (36.8 C) (Oral)   Resp 16   Ht 5\' 3"  (1.6 m)   Wt 118 kg   LMP 04/29/2023 (Exact Date)   BMI 46.08 kg/m  No results found for this or any previous visit (from the past 48 hours).  Constitutional: NAD, AAOx3  HE/ENT: extraocular movements grossly intact, moist mucous membranes CV: RRR PULM: nl respiratory effort, CTABL     Abd: gravid, non-tender, non-distended, soft      Ext: Non-tender, Nonedematous   Psych: mood appropriate, speech normal Pelvic: deferred  Fetal  monitoring: Cat 1 Appropriate for GA Baseline: 140bpm Variability: moderate Accelerations: present x >2 10x10 Decelerations absent Uterine contractions: none Time  A/P: 22 y.o. [redacted]w[redacted]d here for antenatal surveillance for abdominal cramping/pelvic pain  Principle Diagnosis:  Pelvic pain in  pregnancy  Labor: not present. No uterine contractions present. No abdominal or pelvic pain present. UA and wet prep WNL, no infections present. Fetal Wellbeing: Reassuring Cat 1 tracing. Reactive NST  D/c home stable, precautions reviewed, follow-up as scheduled.    Janyce Llanos, CNM 11/17/2023 10:26 AM

## 2023-11-23 ENCOUNTER — Encounter: Payer: Self-pay | Admitting: Obstetrics and Gynecology

## 2023-11-23 ENCOUNTER — Observation Stay
Admission: EM | Admit: 2023-11-23 | Discharge: 2023-11-23 | Disposition: A | Discharge status: Home / Self Care | Payer: 59 | Attending: Obstetrics and Gynecology | Admitting: Obstetrics and Gynecology

## 2023-11-23 ENCOUNTER — Other Ambulatory Visit: Payer: Self-pay

## 2023-11-23 DIAGNOSIS — O368120 Decreased fetal movements, second trimester, not applicable or unspecified: Secondary | ICD-10-CM | POA: Diagnosis not present

## 2023-11-23 DIAGNOSIS — O99891 Other specified diseases and conditions complicating pregnancy: Secondary | ICD-10-CM | POA: Diagnosis not present

## 2023-11-23 DIAGNOSIS — O99353 Diseases of the nervous system complicating pregnancy, third trimester: Principal | ICD-10-CM | POA: Insufficient documentation

## 2023-11-23 DIAGNOSIS — Z7982 Long term (current) use of aspirin: Secondary | ICD-10-CM | POA: Diagnosis not present

## 2023-11-23 DIAGNOSIS — O368130 Decreased fetal movements, third trimester, not applicable or unspecified: Secondary | ICD-10-CM | POA: Insufficient documentation

## 2023-11-23 DIAGNOSIS — Z3A29 29 weeks gestation of pregnancy: Secondary | ICD-10-CM | POA: Diagnosis not present

## 2023-11-23 DIAGNOSIS — G43909 Migraine, unspecified, not intractable, without status migrainosus: Secondary | ICD-10-CM | POA: Insufficient documentation

## 2023-11-23 DIAGNOSIS — O4703 False labor before 37 completed weeks of gestation, third trimester: Secondary | ICD-10-CM | POA: Diagnosis not present

## 2023-11-23 DIAGNOSIS — Z79899 Other long term (current) drug therapy: Secondary | ICD-10-CM | POA: Insufficient documentation

## 2023-11-23 DIAGNOSIS — J45909 Unspecified asthma, uncomplicated: Secondary | ICD-10-CM | POA: Insufficient documentation

## 2023-11-23 DIAGNOSIS — O99513 Diseases of the respiratory system complicating pregnancy, third trimester: Secondary | ICD-10-CM | POA: Insufficient documentation

## 2023-11-23 DIAGNOSIS — O26899 Other specified pregnancy related conditions, unspecified trimester: Principal | ICD-10-CM | POA: Diagnosis present

## 2023-11-23 DIAGNOSIS — R519 Headache, unspecified: Secondary | ICD-10-CM | POA: Diagnosis present

## 2023-11-23 DIAGNOSIS — O0993 Supervision of high risk pregnancy, unspecified, third trimester: Secondary | ICD-10-CM | POA: Diagnosis not present

## 2023-11-23 LAB — PROTEIN / CREATININE RATIO, URINE
Creatinine, Urine: 106 mg/dL
Protein Creatinine Ratio: 0.09 mg/mg{creat} (ref 0.00–0.15)
Total Protein, Urine: 10 mg/dL

## 2023-11-23 LAB — COMPREHENSIVE METABOLIC PANEL
ALT: 18 U/L (ref 0–44)
AST: 15 U/L (ref 15–41)
Albumin: 2.7 g/dL — ABNORMAL LOW (ref 3.5–5.0)
Alkaline Phosphatase: 87 U/L (ref 38–126)
Anion gap: 9 (ref 5–15)
BUN: 6 mg/dL (ref 6–20)
CO2: 20 mmol/L — ABNORMAL LOW (ref 22–32)
Calcium: 8.6 mg/dL — ABNORMAL LOW (ref 8.9–10.3)
Chloride: 106 mmol/L (ref 98–111)
Creatinine, Ser: 0.48 mg/dL (ref 0.44–1.00)
GFR, Estimated: >60 mL/min (ref >60)
Glucose, Bld: 97 mg/dL (ref 70–99)
Potassium: 3.9 mmol/L (ref 3.5–5.1)
Sodium: 135 mmol/L (ref 135–145)
Total Bilirubin: 0.3 mg/dL (ref <1.2)
Total Protein: 6.6 g/dL (ref 6.5–8.1)

## 2023-11-23 LAB — CBC WITH DIFFERENTIAL/PLATELET
Abs Immature Granulocytes: 0.15 10*3/uL — ABNORMAL HIGH (ref 0.00–0.07)
Basophils Absolute: 0 10*3/uL (ref 0.0–0.1)
Basophils Relative: 0 %
Eosinophils Absolute: 0.2 10*3/uL (ref 0.0–0.5)
Eosinophils Relative: 1 %
HCT: 34.9 % — ABNORMAL LOW (ref 36.0–46.0)
Hemoglobin: 11.2 g/dL — ABNORMAL LOW (ref 12.0–15.0)
Immature Granulocytes: 1 %
Lymphocytes Relative: 17 %
Lymphs Abs: 2 10*3/uL (ref 0.7–4.0)
MCH: 25 pg — ABNORMAL LOW (ref 26.0–34.0)
MCHC: 32.1 g/dL (ref 30.0–36.0)
MCV: 77.9 fL — ABNORMAL LOW (ref 80.0–100.0)
Monocytes Absolute: 0.6 10*3/uL (ref 0.1–1.0)
Monocytes Relative: 5 %
Neutro Abs: 8.8 10*3/uL — ABNORMAL HIGH (ref 1.7–7.7)
Neutrophils Relative %: 76 %
Platelets: 432 10*3/uL — ABNORMAL HIGH (ref 150–400)
RBC: 4.48 MIL/uL (ref 3.87–5.11)
RDW: 15.1 % (ref 11.5–15.5)
WBC: 11.7 10*3/uL — ABNORMAL HIGH (ref 4.0–10.5)
nRBC: 0 % (ref 0.0–0.2)

## 2023-11-23 LAB — WET PREP, GENITAL
Clue Cells Wet Prep HPF POC: NONE SEEN
Sperm: NONE SEEN
Trich, Wet Prep: NONE SEEN
WBC, Wet Prep HPF POC: <10 (ref <10)
Yeast Wet Prep HPF POC: NONE SEEN

## 2023-11-23 LAB — FETAL FIBRONECTIN: Fetal Fibronectin: NEGATIVE

## 2023-11-23 MED ORDER — NIFEDIPINE 10 MG PO CAPS
10.0000 mg | ORAL_CAPSULE | Freq: Once | ORAL | Status: AC
  Administered 2023-11-23: 10 mg via ORAL
  Filled 2023-11-23: qty 1

## 2023-11-23 MED ORDER — ACETAMINOPHEN 500 MG PO TABS
1000.0000 mg | ORAL_TABLET | Freq: Four times a day (QID) | ORAL | Status: DC | PRN
  Administered 2023-11-23: 1000 mg via ORAL
  Filled 2023-11-23: qty 2

## 2023-11-23 MED ORDER — PRENATAL MULTIVITAMIN CH: 1.0000 | ORAL_TABLET | Freq: Every day | ORAL | Status: DC

## 2023-11-23 MED ORDER — CALCIUM CARBONATE ANTACID 500 MG PO CHEW: 2.0000 | CHEWABLE_TABLET | ORAL | Status: DC | PRN

## 2023-11-23 MED ORDER — NIFEDIPINE 10 MG PO CAPS: 10.0000 mg | ORAL_CAPSULE | Freq: Three times a day (TID) | ORAL | 0 refills | Status: DC | PRN

## 2023-11-23 MED ORDER — ZOLPIDEM TARTRATE 5 MG PO TABS: 5.0000 mg | ORAL_TABLET | Freq: Every evening | ORAL | Status: DC | PRN

## 2023-11-23 MED ORDER — DOCUSATE SODIUM 100 MG PO CAPS: 100.0000 mg | ORAL_CAPSULE | Freq: Every day | ORAL | Status: DC

## 2023-11-23 MED ORDER — ACETAMINOPHEN 325 MG PO TABS
650.0000 mg | ORAL_TABLET | ORAL | Status: DC | PRN
Start: 2023-11-23 — End: 2023-11-23

## 2023-11-23 NOTE — OB Triage Note (Signed)
22 y.o G1P0 presents to L&D triage at [redacted]w[redacted]d w c/o headache with floaters and abdominal cramping. Pt reports that her headache is a 5/10 and hasn't taken tylenol; she says she sees occasional floaters as well. She notes decreased fetal movement this morning but denies vaginal bleeding and lof. Pt had an elevated BP reading in the office; PIH labs are processing, BP's Q15. Fetal monitors applied and assessing. Andrey Campanile, CNM aware of pt arrival.

## 2023-11-23 NOTE — Discharge Summary (Signed)
Catherine [name] is a 22 y.o. female. She is at [redacted]w[redacted]d gestation. Patient's last menstrual period was 04/29/2023 (exact date). Estimated Date of Delivery: 02/03/24  Prenatal care site: [facility]    Current pregnancy complicated by:  - obesity - anxiety - hx of migraines - elevated protein in NOB urine - varicella non-immune  Chief complaint: she was sent over from the office for multiple complaints, including headaches, floaters in her vision, decreased fetal movement, and preterm uterine contractions.  Headaches - she reports a frequent headache this week, woke up with a headache and it had not resolved. She has not taken any medications for it. Has a history of migraines.  Floaters in her vision - occasional, infrequent, happens with the headaches.  Decreased fetal movement - unsure how many movements in the last hour she has experienced  Preterm uterine contractions: has not timed them, believes she is having more than 6/hour and they have been happening for several weeks  S: Resting comfortably. no CTX, no VB.no LOF,  Active fetal movement.  Denies: HA, visual changes, SOB, or RUQ/epigastric pain  Maternal Medical History:   Past Medical History:  Diagnosis Date   Allergic rhinitis 04/01/2010   Qualifier: Diagnosis of   By: Severiano Gilbert MD, Lori         Allergy    Anxiety    Asthma    past hx   Blanching rash 10/19/2022   Depression    Family history of brain aneurysm- father died around age 37  2020-05-02   Health care maintenance 05-02-20   Care Gaps     High risk heterosexual behavior 05/02/2020   History of anemia 2020-05-02   Menorrhagia with irregular cycle 2020/05/02   Migraine    Pelvic pain May 02, 2020   Polyuria May 02, 2020   Right lower quadrant pain 05/02/20    Past Surgical History:  Procedure Laterality Date   TONSILLECTOMY Bilateral 08/11/2015   Procedure: CONTROL POST TONSILLECTOMY BLEEDING ;  Surgeon: Vernie Murders, MD;  Location: [facility];   Service: ENT;  Laterality: Bilateral;   TONSILLECTOMY AND ADENOIDECTOMY N/A 07/31/2015   Procedure: TONSILLECTOMY AND ADENOIDECTOMY;  Surgeon: Linus Salmons, MD;  Location: [facility];  Service: ENT;  Laterality: N/A;   WISDOM TOOTH EXTRACTION      Allergies  Allergen Reactions   Amoxicillin Hives    Prior to Admission medications   Medication Sig Start Date End Date Taking? Authorizing Provider  aspirin EC 81 MG tablet Take 81 mg by mouth daily. Swallow whole.   Yes [provider]  NIFEdipine (PROCARDIA) 10 MG capsule Take 1 capsule (10 mg total) by mouth 3 (three) times daily as needed (more than 6 contractions per hour). 11/23/23  Yes Janyce Llanos, CNM  prenatal vitamin w/FE, FA (PRENATAL 1 + 1) 27-1 MG TABS tablet Take 1 tablet by mouth daily at 12 noon.   Yes [provider]      Social History: She  reports that she has never smoked. She has never been exposed to tobacco smoke. She has never used smokeless tobacco. She reports that she does not currently use alcohol. She reports that she does not currently use drugs after having used the following drugs: Marijuana.  Family History: family history includes Aneurysm in her father and paternal grandfather; Asthma in her father; Bipolar disorder in her mother; Breast cancer in her maternal grandmother; Cancer in her maternal grandmother and mother; Depression in her mother; Diabetes in her brother; Heart attack in  her maternal grandfather; Heart disease in her maternal grandfather and mother; Hypertension in her brother and father; Lung disease in her maternal grandmother; Schizophrenia in her mother.  no history of gyn cancers  Review of Systems: A full review of systems was performed and negative except as noted in the HPI.     O:  BP 117/76   Pulse 94   Temp 98.1 F (36.7 C) (Oral)   Resp 16   Ht 5\' 3"  (1.6 m)   Wt 118 kg   LMP 04/29/2023 (Exact Date)   BMI 46.08 kg/m  Vitals:    11/23/23 1211 11/23/23 1225 11/23/23 1240 11/23/23 1255  BP: 135/86 137/82 130/82 127/73   11/23/23 1310 11/23/23 1325 11/23/23 1340 11/23/23 1355  BP: 121/70 127/78 124/71 130/71   11/23/23 1410 11/23/23 1425 11/23/23 1440 11/23/23 1441  BP: 119/69 123/69 117/76 117/76    Results for orders placed or performed during the hospital encounter of 11/23/23 (from the past 48 hours)  CBC with Differential/Platelet   Collection Time: 11/23/23 11:48 AM  Result Value Ref Range   WBC 11.7 (H) 4.0 - 10.5 K/uL   RBC 4.48 3.87 - 5.11 MIL/uL   Hemoglobin 11.2 (L) 12.0 - 15.0 g/dL   HCT 53.6 (L) 64.4 - 03.4 %   MCV 77.9 (L) 80.0 - 100.0 fL   MCH 25.0 (L) 26.0 - 34.0 pg   MCHC 32.1 30.0 - 36.0 g/dL   RDW 74.2 59.5 - 63.8 %   Platelets 432 (H) 150 - 400 K/uL   nRBC 0.0 0.0 - 0.2 %   Neutrophils Relative % 76 %   Neutro Abs 8.8 (H) 1.7 - 7.7 K/uL   Lymphocytes Relative 17 %   Lymphs Abs 2.0 0.7 - 4.0 K/uL   Monocytes Relative 5 %   Monocytes Absolute 0.6 0.1 - 1.0 K/uL   Eosinophils Relative 1 %   Eosinophils Absolute 0.2 0.0 - 0.5 K/uL   Basophils Relative 0 %   Basophils Absolute 0.0 0.0 - 0.1 K/uL   Immature Granulocytes 1 %   Abs Immature Granulocytes 0.15 (H) 0.00 - 0.07 K/uL  Comprehensive metabolic panel   Collection Time: 11/23/23 11:48 AM  Result Value Ref Range   Sodium 135 135 - 145 mmol/L   Potassium 3.9 3.5 - 5.1 mmol/L   Chloride 106 98 - 111 mmol/L   CO2 20 (L) 22 - 32 mmol/L   Glucose, Bld 97 70 - 99 mg/dL   BUN 6 6 - 20 mg/dL   Creatinine, Ser 7.56 0.44 - 1.00 mg/dL   Calcium 8.6 (L) 8.9 - 10.3 mg/dL   Total Protein 6.6 6.5 - 8.1 g/dL   Albumin 2.7 (L) 3.5 - 5.0 g/dL   AST 15 15 - 41 U/L   ALT 18 0 - 44 U/L   Alkaline Phosphatase 87 38 - 126 U/L   Total Bilirubin 0.3 <1.2 mg/dL   GFR, Estimated >43 >32 mL/min   Anion gap 9 5 - 15  Protein / creatinine ratio, urine   Collection Time: 11/23/23 11:48 AM  Result Value Ref Range   Creatinine, Urine 106 mg/dL   Total  Protein, Urine 10 mg/dL   Protein Creatinine Ratio 0.09 0.00 - 0.15 mg/mg[Cre]  Wet prep, genital   Collection Time: 11/23/23  1:17 PM  Result Value Ref Range   Yeast Wet Prep HPF POC NONE SEEN NONE SEEN   Trich, Wet Prep NONE SEEN NONE SEEN   Clue Cells Wet Prep  HPF POC NONE SEEN NONE SEEN   WBC, Wet Prep HPF POC <10 <10   Sperm NONE SEEN   Fetal fibronectin   Collection Time: 11/23/23  1:17 PM  Result Value Ref Range   Fetal Fibronectin NEGATIVE NEGATIVE     Constitutional: NAD, AAOx3  HE/ENT: extraocular movements grossly intact, moist mucous membranes CV: RRR PULM: nl respiratory effort, CTABL     Abd: gravid, non-tender, non-distended, soft      Ext: Non-tender, Nonedematous   Psych: mood appropriate, speech normal Pelvic: SSE shows closed thick cervix  Pelvic exam: normal external genitalia, vulva, vagina, cervix, uterus and adnexa.  Fetal  monitoring: Cat 1 Appropriate for GA Baseline: 140bpm Variability: moderate Accelerations: present x >2 Decelerations absent Uterine contractions: q5-69min, mild Time 3 hours  Uterine contraction monitoring for 5 hours  A/P: 22 y.o. [redacted]w[redacted]d here for antenatal surveillance for headaches, visual changes, decreased fetal movement, preterm uterine contractions  Principle Diagnosis:  Migraine headache in pregnancy  Preterm Labor: not present. Evaluated for 5 hours, cervix closed and thick, FFN negative. Contractions decreased after she drank fluids and rested but she reports they were still present. Gave 10mg  Procardia IR and contractions stopped. Sent Rx for Procardia 10mg  IR q8hrs PRN preterm uterine contractions. Encouraged increased hydration. Return with worsening contractions.  Headache: migraine. Pre-eclampsia not present. Improved with Tylenol 1000mg  PO x1, advised to take Tylenol and Benadryl PRN headaches. Decreased fetal movement: lots of fetal movement noted, reassuring fetal tracing. Fetal Wellbeing: Reassuring Cat 1  tracing. Reactive NST  D/c home stable, precautions reviewed, follow-up as scheduled.    Janyce Llanos, CNM 11/23/2023 4:59 PM

## 2023-12-05 ENCOUNTER — Ambulatory Visit (HOSPITAL_COMMUNITY)
Admission: EM | Admit: 2023-12-05 | Discharge: 2023-12-05 | Disposition: A | Discharge status: Home / Self Care | Payer: 59 | Attending: Family Medicine | Admitting: Family Medicine

## 2023-12-05 ENCOUNTER — Encounter (HOSPITAL_COMMUNITY): Payer: Self-pay | Admitting: *Deleted

## 2023-12-05 DIAGNOSIS — J019 Acute sinusitis, unspecified: Secondary | ICD-10-CM | POA: Diagnosis not present

## 2023-12-05 DIAGNOSIS — J069 Acute upper respiratory infection, unspecified: Secondary | ICD-10-CM | POA: Diagnosis not present

## 2023-12-05 LAB — POC COVID19/FLU A&B COMBO
Covid Antigen, POC: NEGATIVE
Influenza A Antigen, POC: NEGATIVE
Influenza B Antigen, POC: NEGATIVE

## 2023-12-05 MED ORDER — CEFDINIR 300 MG PO CAPS: 600.0000 mg | ORAL_CAPSULE | Freq: Every day | ORAL | 0 refills | Status: AC

## 2023-12-05 NOTE — ED Provider Notes (Signed)
 [facility]    CSN: [id number] Arrival date & time: 12/05/23  1018      History   Chief Complaint Chief Complaint  Patient presents with   Cough   Headache   Sore Throat   Nasal Congestion    HPI Catherine [name] is a 22 y.o. female.    Cough Associated symptoms: headaches   Headache Associated symptoms: cough   Sore Throat Associated symptoms include headaches.  Here for cough and sore throat and congestion.  About 1 week ago she began having nasal congestion and some headache.  It is mostly frontal.  Then 2 days ago she started having some cough and some sore throat.  No fever at any point.  No shortness of breath.   She is about [redacted] weeks pregnant  She is allergic to amoxicillin  that causes a rash  Past Medical History:  Diagnosis Date   Allergic rhinitis 04/01/2010   Qualifier: Diagnosis of   By: Mario MD, Lori         Allergy    Anxiety    Asthma    past hx   Blanching rash 10/19/2022   Depression    Family history of brain aneurysm- father died around age 64  04-24-2020   Health care maintenance 04-24-2020   Care Gaps     High risk heterosexual behavior Apr 24, 2020   History of anemia 04/24/2020   Menorrhagia with irregular cycle Apr 24, 2020   Migraine    Pelvic pain Apr 24, 2020   Polyuria 04-24-2020   Right lower quadrant pain 04/24/20    Patient Active Problem List   Diagnosis Date Noted   Headache in pregnancy, antepartum 11/23/2023   Round ligament pain 11/14/2023   Back pain affecting pregnancy 10/31/2023   Cramping affecting pregnancy, antepartum 10/31/2023   Anxiety 10/05/2023   Maternal varicella, non-immune 10/05/2023   Encounter for supervision of normal first pregnancy in second trimester 07/12/2023   Morbid obesity with BMI of 45.0-49.9, adult (HCC) 04/27/2023   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   GERD (gastroesophageal reflux disease) 09/22/2022   Anxiety with depression 06/09/2022   Vitamin D   insufficiency 2020-04-24   Body mass index (BMI) of 40.1-44.9 in adult [facility]) 04/24/2020   Migraine with aura and without status migrainosus, not intractable 24-Apr-2020   Childhood asthma, mild intermittent, uncomplicated 02/01/2007    Past Surgical History:  Procedure Laterality Date   TONSILLECTOMY Bilateral 08/11/2015   Procedure: CONTROL POST TONSILLECTOMY BLEEDING ;  Surgeon: Deward Argue, MD;  Location: [facility];  Service: ENT;  Laterality: Bilateral;   TONSILLECTOMY AND ADENOIDECTOMY N/A 07/31/2015   Procedure: TONSILLECTOMY AND ADENOIDECTOMY;  Surgeon: Chinita Hasten, MD;  Location: [facility];  Service: ENT;  Laterality: N/A;   WISDOM TOOTH EXTRACTION      OB History     Gravida  1   Para  0   Term  0   Preterm  0   AB  0   Living  0      SAB  0   IAB  0   Ectopic  0   Multiple  0   Live Births  0            Home Medications    Prior to Admission medications   Medication Sig Start Date End Date Taking? Authorizing Provider  aspirin EC 81 MG tablet Take 81 mg by mouth daily. Swallow whole.   Yes [provider]  cefdinir  (OMNICEF ) 300 MG capsule Take 2  capsules (600 mg total) by mouth daily for 7 days. 12/05/23 12/12/23 Yes BanisterSharlet POUR, MD  prenatal vitamin w/FE, FA (PRENATAL 1 + 1) 27-1 MG TABS tablet Take 1 tablet by mouth daily at 12 noon.   Yes [provider]  NIFEdipine  (PROCARDIA ) 10 MG capsule Take 1 capsule (10 mg total) by mouth 3 (three) times daily as needed (more than 6 contractions per hour). 11/23/23   Tanda Edsel Fuller, CNM    Family History Family History  Problem Relation Age of Onset   Heart disease Mother    Bipolar disorder Mother    Depression Mother    Cancer Mother    Schizophrenia Mother    Asthma Father    Hypertension Father    Aneurysm Father    Hypertension Brother    Diabetes Brother    Breast cancer Maternal Grandmother    Cancer Maternal Grandmother    Lung disease  Maternal Grandmother    Heart disease Maternal Grandfather    Heart attack Maternal Grandfather    Aneurysm Paternal Grandfather     Social History Social History   Tobacco Use   Smoking status: Never    Passive exposure: Never   Smokeless tobacco: Never  Vaping Use   Vaping status: Never Used  Substance Use Topics   Alcohol use: Not Currently    Comment: once per month, 1-2 drinks   Drug use: Not Currently    Types: Marijuana    Comment: last use feb 2024     Allergies   Amoxicillin    Review of Systems Review of Systems  Respiratory:  Positive for cough.   Neurological:  Positive for headaches.     Physical Exam Triage Vital Signs ED Triage Vitals  Encounter Vitals Group     BP 12/05/23 1146 118/81     Systolic BP Percentile --      Diastolic BP Percentile --      Pulse Rate 12/05/23 1146 86     Resp 12/05/23 1146 18     Temp 12/05/23 1146 97.9 F (36.6 C)     Temp Source 12/05/23 1146 Oral     SpO2 12/05/23 1146 98 %     Weight --      Height --      Head Circumference --      Peak Flow --      Pain Score 12/05/23 1145 4     Pain Loc --      Pain Education --      Exclude from Growth Chart --    No data found.  Updated Vital Signs BP 118/81 (BP Location: Right Arm)   Pulse 86   Temp 97.9 F (36.6 C) (Oral)   Resp 18   LMP 04/29/2023 (Exact Date)   SpO2 98%   Visual Acuity Right Eye Distance:   Left Eye Distance:   Bilateral Distance:    Right Eye Near:   Left Eye Near:    Bilateral Near:     Physical Exam Vitals reviewed.  Constitutional:      General: She is not in acute distress.    Appearance: She is not toxic-appearing.  HENT:     Right Ear: Tympanic membrane and ear canal normal.     Left Ear: Tympanic membrane and ear canal normal.     Nose: Congestion present.     Mouth/Throat:     Mouth: Mucous membranes are moist.     Comments: There is no erythema.  There is some clear exudate in the posterior oropharynx Eyes:      Extraocular Movements: Extraocular movements intact.     Conjunctiva/sclera: Conjunctivae normal.     Pupils: Pupils are equal, round, and reactive to light.  Cardiovascular:     Rate and Rhythm: Normal rate and regular rhythm.     Heart sounds: No murmur heard. Pulmonary:     Effort: No respiratory distress.     Breath sounds: No stridor. No wheezing, rhonchi or rales.  Musculoskeletal:     Cervical back: Neck supple.  Lymphadenopathy:     Cervical: No cervical adenopathy.  Skin:    Capillary Refill: Capillary refill takes less than 2 seconds.     Coloration: Skin is not jaundiced or pale.  Neurological:     General: No focal deficit present.     Mental Status: She is alert and oriented to person, place, and time.  Psychiatric:        Behavior: Behavior normal.      UC Treatments / Results  Labs (all labs ordered are listed, but only abnormal results are displayed) Labs Reviewed  POC COVID19/FLU A&B COMBO    EKG   Radiology No results found.  Procedures Procedures (including critical care time)  Medications Ordered in UC Medications - No data to display  Initial Impression / Assessment and Plan / UC Course  I have reviewed the triage vital signs and the nursing notes.  Pertinent labs & imaging results that were available during my care of the patient were reviewed by me and considered in my medical decision making (see chart for details).     Flu and COVID test is negative.  Omnicef  is sent in for sinusitis, since symptomatic for 1 week, with some intensifying of symptoms 2 days ago.  She is given a list of medications that are safe in pregnancy  Final Clinical Impressions(s) / UC Diagnoses   Final diagnoses:  Acute sinusitis, recurrence not specified, unspecified location  Viral upper respiratory tract infection     Discharge Instructions      The flu and COVID test was negative  Take cefdinir  300 mg--2 capsules together daily for 7 days; this  is for potential sinus infection.       ED Prescriptions     Medication Sig Dispense Auth. Provider   cefdinir  (OMNICEF ) 300 MG capsule Take 2 capsules (600 mg total) by mouth daily for 7 days. 14 capsule Vonna, Chia Mowers K, MD      PDMP not reviewed this encounter.   Vonna Sharlet POUR, MD 12/05/23 [phone]

## 2023-12-05 NOTE — Discharge Instructions (Signed)
The flu and COVID test was negative  Take cefdinir 300 mg--2 capsules together daily for 7 days; this is for potential sinus infection.

## 2023-12-05 NOTE — ED Triage Notes (Signed)
Pt states she is [redacted] weeks pregnant and was advised to come to UC by her OBGYN.   She has had headache x 1 week. Cough, congestion and sore throat over the last couple of days. She isn't taking any meds for her sx since she is pregnant.

## 2023-12-21 DIAGNOSIS — M6281 Muscle weakness (generalized): Secondary | ICD-10-CM | POA: Diagnosis not present

## 2023-12-21 DIAGNOSIS — M5430 Sciatica, unspecified side: Secondary | ICD-10-CM | POA: Diagnosis not present

## 2023-12-26 ENCOUNTER — Encounter: Payer: Self-pay | Admitting: Obstetrics and Gynecology

## 2023-12-26 ENCOUNTER — Observation Stay
Admission: EM | Admit: 2023-12-26 | Discharge: 2023-12-26 | Disposition: A | Discharge status: Home / Self Care | Payer: 59 | Attending: Obstetrics and Gynecology | Admitting: Obstetrics and Gynecology

## 2023-12-26 ENCOUNTER — Other Ambulatory Visit: Payer: Self-pay

## 2023-12-26 DIAGNOSIS — F419 Anxiety disorder, unspecified: Secondary | ICD-10-CM | POA: Insufficient documentation

## 2023-12-26 DIAGNOSIS — L2989 Other pruritus: Secondary | ICD-10-CM | POA: Diagnosis not present

## 2023-12-26 DIAGNOSIS — Z7982 Long term (current) use of aspirin: Secondary | ICD-10-CM | POA: Diagnosis not present

## 2023-12-26 DIAGNOSIS — R030 Elevated blood-pressure reading, without diagnosis of hypertension: Secondary | ICD-10-CM | POA: Diagnosis not present

## 2023-12-26 DIAGNOSIS — Z6841 Body Mass Index (BMI) 40.0 and over, adult: Secondary | ICD-10-CM | POA: Diagnosis not present

## 2023-12-26 DIAGNOSIS — O99713 Diseases of the skin and subcutaneous tissue complicating pregnancy, third trimester: Secondary | ICD-10-CM | POA: Diagnosis not present

## 2023-12-26 DIAGNOSIS — O09893 Supervision of other high risk pregnancies, third trimester: Secondary | ICD-10-CM | POA: Diagnosis not present

## 2023-12-26 DIAGNOSIS — O99343 Other mental disorders complicating pregnancy, third trimester: Secondary | ICD-10-CM | POA: Insufficient documentation

## 2023-12-26 DIAGNOSIS — Z79899 Other long term (current) drug therapy: Secondary | ICD-10-CM | POA: Insufficient documentation

## 2023-12-26 DIAGNOSIS — O99891 Other specified diseases and conditions complicating pregnancy: Secondary | ICD-10-CM | POA: Diagnosis not present

## 2023-12-26 DIAGNOSIS — L299 Pruritus, unspecified: Secondary | ICD-10-CM | POA: Insufficient documentation

## 2023-12-26 DIAGNOSIS — O163 Unspecified maternal hypertension, third trimester: Secondary | ICD-10-CM | POA: Diagnosis not present

## 2023-12-26 DIAGNOSIS — E669 Obesity, unspecified: Secondary | ICD-10-CM | POA: Diagnosis not present

## 2023-12-26 DIAGNOSIS — O99213 Obesity complicating pregnancy, third trimester: Secondary | ICD-10-CM | POA: Diagnosis not present

## 2023-12-26 DIAGNOSIS — O0993 Supervision of high risk pregnancy, unspecified, third trimester: Secondary | ICD-10-CM | POA: Diagnosis not present

## 2023-12-26 DIAGNOSIS — H538 Other visual disturbances: Secondary | ICD-10-CM | POA: Diagnosis not present

## 2023-12-26 DIAGNOSIS — Z3A34 34 weeks gestation of pregnancy: Secondary | ICD-10-CM | POA: Diagnosis not present

## 2023-12-26 DIAGNOSIS — R1011 Right upper quadrant pain: Secondary | ICD-10-CM | POA: Diagnosis present

## 2023-12-26 DIAGNOSIS — O26899 Other specified pregnancy related conditions, unspecified trimester: Principal | ICD-10-CM

## 2023-12-26 LAB — CBC
HCT: 37.8 % (ref 36.0–46.0)
Hemoglobin: 12.1 g/dL (ref 12.0–15.0)
MCH: 24.9 pg — ABNORMAL LOW (ref 26.0–34.0)
MCHC: 32 g/dL (ref 30.0–36.0)
MCV: 77.8 fL — ABNORMAL LOW (ref 80.0–100.0)
Platelets: 387 10*3/uL (ref 150–400)
RBC: 4.86 MIL/uL (ref 3.87–5.11)
RDW: 15.3 % (ref 11.5–15.5)
WBC: 11.7 10*3/uL — ABNORMAL HIGH (ref 4.0–10.5)
nRBC: 0 % (ref 0.0–0.2)

## 2023-12-26 LAB — COMPREHENSIVE METABOLIC PANEL WITH GFR
ALT: 14 U/L (ref 0–44)
AST: 14 U/L — ABNORMAL LOW (ref 15–41)
Albumin: 2.7 g/dL — ABNORMAL LOW (ref 3.5–5.0)
Alkaline Phosphatase: 90 U/L (ref 38–126)
Anion gap: 11 (ref 5–15)
BUN: <5 mg/dL — ABNORMAL LOW (ref 6–20)
CO2: 19 mmol/L — ABNORMAL LOW (ref 22–32)
Calcium: 8.9 mg/dL (ref 8.9–10.3)
Chloride: 104 mmol/L (ref 98–111)
Creatinine, Ser: 0.58 mg/dL (ref 0.44–1.00)
GFR, Estimated: >60 mL/min
Glucose, Bld: 86 mg/dL (ref 70–99)
Potassium: 4.3 mmol/L (ref 3.5–5.1)
Sodium: 134 mmol/L — ABNORMAL LOW (ref 135–145)
Total Bilirubin: 0.4 mg/dL (ref 0.0–1.2)
Total Protein: 6.4 g/dL — ABNORMAL LOW (ref 6.5–8.1)

## 2023-12-26 LAB — PROTEIN / CREATININE RATIO, URINE
Creatinine, Urine: 48 mg/dL
Total Protein, Urine: <6 mg/dL

## 2023-12-26 MED ORDER — ACETAMINOPHEN 325 MG PO TABS: 650.0000 mg | ORAL_TABLET | ORAL | Status: DC | PRN

## 2023-12-26 MED ORDER — CALCIUM CARBONATE ANTACID 500 MG PO CHEW: 2.0000 | CHEWABLE_TABLET | ORAL | Status: DC | PRN

## 2023-12-26 NOTE — OB Triage Note (Signed)
Pt discharged home per order.   Pt stable and ambulatory and an After Visit Summary was printed and given to the patient. Discharge education completed with patient/family including follow up instructions, appointments, and medication list. Pt received labor and PIH precautions. Patient able to verbalize understanding, all questions fully answered upon discharge.  Bile acids pending- will result in a few days. Pt and CNM aware.

## 2023-12-26 NOTE — Discharge Summary (Signed)
Catherine [name] is a 23 y.o. female. She is at [redacted]w[redacted]d gestation. Patient's last menstrual period was 04/29/2023 (exact date). Estimated Date of Delivery: 02/03/24  Prenatal care site: [facility]    Current pregnancy complicated by:  - obesity - anxiety - hx of migraines - elevated protein in NOB urine - varicella non-immune  Chief complaint: elevated BP, sent to the office for elevated BP of 130s/90s and reports of occasional spots in her vision. She also reports occasional itching in her hands. She denies any current symptoms and has not experienced any symptoms in the last 24hrs.  S: Resting comfortably. no CTX, no VB.no LOF,  Active fetal movement.  Denies: HA, visual changes, SOB, or RUQ/epigastric pain  Maternal Medical History:   Past Medical History:  Diagnosis Date   Allergic rhinitis 04/01/2010   Qualifier: Diagnosis of   By: Severiano Gilbert MD, Lori         Allergy    Anxiety    Asthma    past hx   Blanching rash 10/19/2022   Depression    Family history of brain aneurysm- father died around age 58  04/20/20   Health care maintenance Apr 20, 2020   Care Gaps     High risk heterosexual behavior Apr 20, 2020   History of anemia 04/20/2020   Menorrhagia with irregular cycle Apr 20, 2020   Migraine    Pelvic pain 04-20-20   Polyuria 04-20-2020   Right lower quadrant pain Apr 20, 2020    Past Surgical History:  Procedure Laterality Date   TONSILLECTOMY Bilateral 08/11/2015   Procedure: CONTROL POST TONSILLECTOMY BLEEDING ;  Surgeon: Vernie Murders, MD;  Location: [facility];  Service: ENT;  Laterality: Bilateral;   TONSILLECTOMY AND ADENOIDECTOMY N/A 07/31/2015   Procedure: TONSILLECTOMY AND ADENOIDECTOMY;  Surgeon: Linus Salmons, MD;  Location: [facility];  Service: ENT;  Laterality: N/A;   WISDOM TOOTH EXTRACTION      Allergies  Allergen Reactions   Amoxicillin Hives    Prior to Admission medications   Medication Sig Start Date End Date Taking?  Authorizing Provider  aspirin EC 81 MG tablet Take 81 mg by mouth daily. Swallow whole.   Yes [provider]  prenatal vitamin w/FE, FA (PRENATAL 1 + 1) 27-1 MG TABS tablet Take 1 tablet by mouth daily at 12 noon.   Yes [provider]  NIFEdipine (PROCARDIA) 10 MG capsule Take 1 capsule (10 mg total) by mouth 3 (three) times daily as needed (more than 6 contractions per hour). Patient not taking: Reported on 12/26/2023 11/23/23   Janyce Llanos, CNM      Social History: She  reports that she has never smoked. She has never been exposed to tobacco smoke. She has never used smokeless tobacco. She reports that she does not currently use alcohol. She reports that she does not currently use drugs.  Family History: family history includes Aneurysm in her father and paternal grandfather; Asthma in her father; Bipolar disorder in her mother; Breast cancer in her maternal grandmother; Cancer in her maternal grandmother and mother; Depression in her mother; Diabetes in her brother; Heart attack in her maternal grandfather; Heart disease in her maternal grandfather and mother; Hypertension in her brother and father; Lung disease in her maternal grandmother; Schizophrenia in her mother.  no history of gyn cancers  Review of Systems: A full review of systems was performed and negative except as noted in the HPI.     O:  BP 126/64   Pulse 87  Temp 98.4 F (36.9 C) (Oral)   Resp 18   Ht 5\' 3"  (1.6 m)   Wt 123.8 kg   LMP 04/29/2023 (Exact Date)   BMI 48.36 kg/m  Vitals:   12/26/23 1120 12/26/23 1140 12/26/23 1155 12/26/23 1210  BP: 127/84 121/71 117/73 120/74   12/26/23 1225 12/26/23 1240 12/26/23 1255 12/26/23 1310  BP: 118/73 121/69 117/81 126/64    Results for orders placed or performed during the hospital encounter of 12/26/23 (from the past 48 hours)  Protein / creatinine ratio, urine   Collection Time: 12/26/23 11:26 AM  Result Value Ref Range   Creatinine,  Urine 48 mg/dL   Total Protein, Urine <6 mg/dL   Protein Creatinine Ratio        0.00 - 0.15 mg/mg[Cre]  Comprehensive metabolic panel   Collection Time: 12/26/23 11:36 AM  Result Value Ref Range   Sodium 134 (L) 135 - 145 mmol/L   Potassium 4.3 3.5 - 5.1 mmol/L   Chloride 104 98 - 111 mmol/L   CO2 19 (L) 22 - 32 mmol/L   Glucose, Bld 86 70 - 99 mg/dL   BUN <5 (L) 6 - 20 mg/dL   Creatinine, Ser 2.84 0.44 - 1.00 mg/dL   Calcium 8.9 8.9 - 13.2 mg/dL   Total Protein 6.4 (L) 6.5 - 8.1 g/dL   Albumin 2.7 (L) 3.5 - 5.0 g/dL   AST 14 (L) 15 - 41 U/L   ALT 14 0 - 44 U/L   Alkaline Phosphatase 90 38 - 126 U/L   Total Bilirubin 0.4 0.0 - 1.2 mg/dL   GFR, Estimated >44 >01 mL/min   Anion gap 11 5 - 15  CBC   Collection Time: 12/26/23 11:36 AM  Result Value Ref Range   WBC 11.7 (H) 4.0 - 10.5 K/uL   RBC 4.86 3.87 - 5.11 MIL/uL   Hemoglobin 12.1 12.0 - 15.0 g/dL   HCT 02.7 25.3 - 66.4 %   MCV 77.8 (L) 80.0 - 100.0 fL   MCH 24.9 (L) 26.0 - 34.0 pg   MCHC 32.0 30.0 - 36.0 g/dL   RDW 40.3 47.4 - 25.9 %   Platelets 387 150 - 400 K/uL   nRBC 0.0 0.0 - 0.2 %     Constitutional: NAD, AAOx3  HE/ENT: extraocular movements grossly intact, moist mucous membranes CV: RRR PULM: nl respiratory effort, CTABL     Abd: gravid, non-tender, non-distended, soft      Ext: Non-tender, Nonedematous   Psych: mood appropriate, speech normal Pelvic: deferred  Fetal  monitoring: Cat 1 Appropriate for GA Baseline: 135-140bpm Variability: moderate Accelerations: present x >2 Decelerations absent Time 3 hours   A/P: 23 y.o. [redacted]w[redacted]d here for antenatal surveillance for PIH evaluation  Principle Diagnosis:  PIH evaluation, elevated BP without diagnosis of HTN  Preeclampsia: not present, normal labs, BP 110s-120s/70s-80s, no PIH symptoms. Itching: none currently, has not been present in over 24hrs. Bile acids ordered, will result in 3-5 days. Reassuring fetal tracing x 3 hours. Will follow-up with prenatal  visit and lab review in 1wk. Return with worsening itching.   Labor: not present.  Fetal Wellbeing: Reassuring Cat 1 tracing. Reactive NST  D/c home stable, precautions reviewed, follow-up as scheduled.    Janyce Llanos, CNM 12/26/2023 2:17 PM

## 2023-12-26 NOTE — Progress Notes (Signed)
Pt presents to L/D triage from office for elevated Bps.  Pt reports no current PIH symptoms. Pt notes generalized swelling and itching of hands and feet. Pt also states she has occasional spots in her vision/dizziness/nausea with none noted now. Pt reports looser stools than normal. No current pain. Pt afebrile.  She has recent finished a course of antibiotics for a sinus infection.  +2 reflexes. No clonus. Non-pitting edema noted.  Monitors applied and assessing. Initial BP 127/84-cycling q15 min.  VSS.  Initial FHT 125.  Labs collected and CNM notified of patient's arrival.

## 2023-12-27 LAB — BILE ACIDS, TOTAL: Bile Acids Total: <1 umol/L (ref 0.0–10.0)

## 2023-12-28 ENCOUNTER — Ambulatory Visit: Payer: 59 | Attending: Obstetrics

## 2023-12-28 ENCOUNTER — Ambulatory Visit: Payer: 59

## 2023-12-28 ENCOUNTER — Ambulatory Visit (INDEPENDENT_AMBULATORY_CARE_PROVIDER_SITE_OTHER): Payer: 59 | Admitting: Obstetrics & Gynecology

## 2023-12-28 ENCOUNTER — Other Ambulatory Visit: Payer: Self-pay

## 2023-12-28 ENCOUNTER — Encounter: Payer: Self-pay | Admitting: Obstetrics & Gynecology

## 2023-12-28 VITALS — BP 128/84 | HR 98 | Wt 274.0 lb

## 2023-12-28 DIAGNOSIS — E669 Obesity, unspecified: Secondary | ICD-10-CM

## 2023-12-28 DIAGNOSIS — O0993 Supervision of high risk pregnancy, unspecified, third trimester: Secondary | ICD-10-CM | POA: Diagnosis not present

## 2023-12-28 DIAGNOSIS — Z3A34 34 weeks gestation of pregnancy: Secondary | ICD-10-CM

## 2023-12-28 DIAGNOSIS — Z1339 Encounter for screening examination for other mental health and behavioral disorders: Secondary | ICD-10-CM

## 2023-12-28 DIAGNOSIS — O99213 Obesity complicating pregnancy, third trimester: Secondary | ICD-10-CM | POA: Insufficient documentation

## 2023-12-28 DIAGNOSIS — M5430 Sciatica, unspecified side: Secondary | ICD-10-CM | POA: Diagnosis not present

## 2023-12-28 NOTE — Progress Notes (Signed)
   PRENATAL VISIT NOTE  Subjective:  Catherine [name] is a 23 y.o. G1P0000 at [redacted]w[redacted]d being seen today for transfer of prenatal care from [facility]; this is her preference.  She was just seen there on 12/27/23, no significant active concerns.  She is currently monitored for the following issues for this high-risk pregnancy and has Vitamin D insufficiency; Body mass index (BMI) of 40.1-44.9 in adult [facility]); Migraine with aura and without status migrainosus, not intractable; Anxiety with depression; Lumbar disc herniation with radiculopathy; GERD (gastroesophageal reflux disease); Morbid obesity with BMI of 45.0-49.9, adult (HCC); Back pain affecting pregnancy; Supervision of high risk pregnancy in third trimester; Maternal varicella, non-immune; Headache in pregnancy, antepartum; and Maternal morbid obesity, antepartum (HCC) on their problem list.  Patient reports no complaints. Here with FOB.  Contractions: Irritability. Vag. Bleeding: None.  Movement: Present. Denies leaking of fluid.   The following portions of the patient's history were reviewed and updated as appropriate: allergies, current medications, past family history, past medical history, past social history, past surgical history and problem list.   Objective:   Vitals:   12/28/23 0952  BP: 128/84  Pulse: 98  Weight: 274 lb (124.3 kg)    Fetal Status: Fetal Heart Rate (bpm): 158   Movement: Present     General:  Alert, oriented and cooperative. Patient is in no acute distress.  Skin: Skin is warm and dry. No rash noted.   Cardiovascular: Normal heart rate noted  Respiratory: Normal respiratory effort, no problems with respiration noted  Abdomen: Soft, gravid, appropriate for gestational age.  Pain/Pressure: Present     Pelvic: Cervical exam deferred        Extremities: Normal range of motion.  Edema: Mild pitting, slight indentation  Mental Status: Normal mood and affect. Normal behavior. Normal judgment and thought content.     Assessment and Plan:  Pregnancy: G1P0000 at [redacted]w[redacted]d 1. Maternal morbid obesity, antepartum (HCC) Already seen by MFM and scheduled for BPP  and growth scan later today.  Will need weekly antenatal testing until delivery.  2. [redacted] weeks gestation of pregnancy 3. Supervision of high risk pregnancy in third trimester (Primary) All questions about [facility] answered. The nature of  [facility] with multiple MDs and other Advanced Practice Providers was explained to patient; also emphasized that residents, students are part of our team. Preterm labor symptoms and general obstetric precautions including but not limited to vaginal bleeding, contractions, leaking of fluid and fetal movement were reviewed in detail with the patient. Please refer to After Visit Summary for other counseling recommendations.   Return in about 1 week (around 01/04/2024) for NST, BPP, OFFICE OB VISIT (MD only).  Future Appointments  Date Time Provider Department Center  12/28/2023  3:30 PM [facility]    Jaynie Collins, MD

## 2023-12-28 NOTE — Patient Instructions (Addendum)
 Return to office for any scheduled appointments. Call the office or go to the MAU at [facility] at [facility] if: You begin to have strong, frequent contractions Your water breaks.  Sometimes it is a big gush of fluid, sometimes it is just a trickle that keeps getting your underwear wet or running down your legs You have vaginal bleeding.  It is normal to have a small amount of spotting if your cervix was checked.  You do not feel your baby moving like normal.  If you do not, get something to eat and drink and lay down and focus on feeling your baby move.   If your baby is still not moving like normal, you should call the office or go to MAU. Any other obstetric concerns.   RSV Vaccination for Pregnant People  CDC recommends two ways to protect babies from getting very sick with Respiratory Syncytial Virus (RSV):  An RSV vaccination given during pregnancy  Pfizer's vaccine Verdis Frederickson) is recommended for use during pregnancy. It is given during RSV season to people who are 32 through [redacted] weeks pregnant.  Or, An RSV immunization given directly to infants and some older babies  Babies born to mothers who get RSV vaccine at least 2 weeks before delivery will have protection and, in most cases, should not need an RSV immunization later.    When is RSV season?  In most regions of the [place] RSV season starts in the fall and peaks in the winter, but the timing and severity of RSV season can vary from place to place and year to year.   The goal of maternal RSV vaccination is to protect babies from getting very sick with RSV during their first RSV season.  In most of the [place], this means maternal RSV vaccine will be given in September through January.  Who should get the maternal RSV vaccine?  People who are 74 through [redacted] weeks pregnant during September through January should get one dose of maternal RSV vaccine to protect their babies. RSV season can  vary around the country.   How is the maternal RSV vaccine administered?  Maternal RSV vaccine is given as a shot into the mother's upper arm. Only a single dose (one shot) of maternal RSV vaccine is recommended.   It is not yet known whether another dose might be needed in later pregnancies.  How well does the maternal RSV vaccine work?  When someone gets RSV vaccine, their body responds by making a protein that protects against the virus that causes RSV. The process takes about 2 weeks. When a pregnant person gets RSV vaccine, their protective proteins (called antibodies) also pass to their baby. So, babies who are born at least 2 weeks after their mother gets RSV vaccine are protected at birth, when infants are at the highest risk of severe RSV disease.   The vaccine can reduce a baby's risk of being hospitalized from RSV by 57% in the first six months after birth.  What are the possible side effects of the maternal RSV vaccine?  In the clinical trials, the side effects most often reported by pregnant people who received the maternal RSV vaccine were pain at the injection site, headache, muscle pain, and nausea.  Although not common, a dangerous high blood pressure condition called pre-eclampsia occurred in 1.8% of pregnant people who received the maternal RSV vaccine compared to 1.4% of pregnant people who received a placebo.  The clinical trials identified  a small increase in the number of preterm births in vaccinated pregnant people. It is not clear if this is a true safety problem related to RSV vaccine or if this occurred for reasons unrelated to vaccination.  To reduce the potential risk of preterm birth and complications from RSV disease, FDA approved the maternal RSV vaccine for use during weeks 32 through 17 of pregnancy while additional studies are conducted.  FDA is requiring the manufacturer to do additional studies that will look more closely at the potential risk of preterm  births and pregnancy-related high blood pressure issues in mothers, including pre-eclampsia.  Severe allergic reactions to vaccines are rare but can happen after any vaccine and can be life-threatening. If you see signs of a severe allergic reaction after vaccination (hives, swelling of the face and throat, difficulty breathing, a fast heartbeat, dizziness, or weakness), seek immediate medical care by calling 911.  As with any medicine or vaccine there is a very remote chance of the vaccine causing other serious injury or death after vaccination.  Adverse events following vaccination should be reported to the Vaccine Adverse Event Reporting System (VAERS), even if it's not clear that the vaccine caused the adverse event. You or your doctor can report an adverse event to [facility] and FDA through VAERS. If you need further assistance reporting to VAERS, please email [email or web address] .[email or web address] or call [phone].  If you have any questions about side effects from the maternal RSV vaccine, talk with your healthcare provider.  Do I need a prescription for a maternal RSV vaccine?  Until the vaccine available in the office, you will need a prescription to take to a local pharmacy that is providing the vaccine.   How do I pay for the maternal RSV vaccine?  Most private health insurance plans cover the maternal RSV vaccine, but there may be a cost to you depending on your plan.  Contact your insurer to find out.  Medicaid Beginning September 04, 2022, most people with coverage from [facility] and [employer] [facility]) will be guaranteed coverage of all vaccines recommended by the Advisory Committee on Immunization Practice at no cost to them.   Source: [facility] for Immunization and Respiratory Diseases    [facility] Pediatric Providers  Central/Southeast [place] ([id number]) [facility] Manson Passey, MD; Deirdre Priest, MD; Lum Babe, MD; Leveda Anna, MD; McDiarmid, MD; Jerene Bears,  MD [address] [place], [place] [address] [phone] Mon-Fri 8:30-12:30, 1:30-5:00  Providers come to see babies during newborn hospitalization Only accepting infants of Mother's who are seen at [facility] or have siblings seen at   [facility] Medicaid - Yes; Tricare - Yes   [facility] [place], MD [address] [place], [place] [address] [phone] Mon, Tue, Thur, Fri 8:30-5:00, Wed 10:00-7:00 (closed 1-2pm daily for lunch) [facility] residents with no insurance.  [place] [employer] only with Medicaid/insurance; Tricare - no  [facility]) - [facility], MD; Manson Passey, MD; Ave Filter, MD; Luna Fuse, MD; Kennedy Bucker, MD; Florestine Avers, MD; Melchor Amour, MD; Yetta Barre,  MD; Konrad Dolores, MD; Kathlene November, MD; Jenne Campus, MD; Wynetta Emery, MD; Duffy Rhody, MD; Gerre Couch, NP [address] [place], [place], [place] [address] [phone] Mon, Tue, Thur, Fri 8:30-5:30, Wed 9:30-5:30, Sat 8:30-12:30 Only accepting infants of first-time parents or siblings of current patients Hospital discharge coordinator will make follow-up appointment Medicaid - yes; Tricare - yes  [place] [phone]) [facility] Ilean China, MD; Earlene Plater, MD; Jamesetta Orleans,  MD; Alvera Novel, MD; Rana Snare, MD; [facility], MD; [place], MD; Hosie Poisson, MD; Mayford Knife, MD [address], [place], [place] [address] [phone] Mon-Fri 8:30-5:00, closed for lunch 12:30-1:30; Sat-Sun 10:00-1:00 Accepting Newborns with commercial insurance only, must call prior to delivery to be accepted into  practice.  Medicaid - no, Tricare - yes   [facility] [address] [place], [place] [address] [phone] or [phone] Mon to Fri 8am to 10pm, Sat 8am to 1pm (virtual only on weekends) Only accepts Medicaid Healthy Blue pts  [facility] ([facility]) - Pediatrics at Elige Radon, MD; Sabino Dick, MD; [place] Livings, MD; Betha Loa,  NP; Claretha Cooper, MD; Lelon Perla, MD [address] [place], [place] [address] [phone] Mon-Fri 8:30-5:30 Medicaid - yes, Tricare - yes  [place] [phone]) [facility] Marcie Mowers, MD [address], [place], [place] [address] [phone] Iona Hansen, Wed Fri 8:30-5:00, Sat 8:30-12:00, Closed Thursdays Accepting siblings of established patients and first time mom's if you call prenatally Medicaid- yes; Tricare - yes  [facility] Lutricia Feil, [place]; Tracie Harrier, MD; Rusty Aus; Scif[place], PA; Wynelle Link, MD; Azucena Cecil, MD;  [address], [place], [place] [address] [phone] Mon-Fri 8:30-5:00, closed for lunch 1-2 Only accepting newborns of established patients Medicaid- no; Tricare - yes  [facility] [phone]) [facility] Morene Crocker, MD; [address], [place], [place] [address] [phone] Mon-Fri 8:00-5:00 Medicaid - No; Tricare - Yes  [facility], [place]; [place], [place] [address] [place], [place] [address] [phone] Mon-Fri 8:00-5:00 Medicaid - No, Tricare - Yes  [facility] Cardell Peach, MD; Nash Dimmer, MD; [place], [place] [address] [place], [place] [address] [phone]  Mon-Fri 8:00-5:00 Medicaid - No; Tricare - Yes  [facility] [address] [place], [place] [address] [phone] Mon-Fri 8:30-5:00 (lunch 12:00-1:00) Medicaid -Yes; Tricare - Yes  [facility] [place], MD [address] [place], [place], [place] [address] [phone] Mon-Fri 8:00-5:00 Seeing newborns of current patients only. No new patients Medicaid - No, Tricare - yes  Nature conservation officer at [place] [address] MD [address] [place], [place] [address] [phone] Mon-Fri 8:00-5:00 Medicaid -yes as secondary coverage only; Tricare - yes  [facility] [place], [place]; [place], [place]; Avis Epley, MD;  Vonna Kotyk, MD; Clance Boll, MD; [place], [place]; [place], NP; Vaughan Basta, MD; [place], MD [address] [place], [place] [address] [phone] Mon-Fri 8:30-5:00, Sat 9:00-11:00 Accepts commercial insurance ONLY. Offers free prenatal information sessions for families. Medicaid - No, Tricare - Call first  [facility] [place], MD; [place], [place]; [place], [place]; [place], [place] [address] [place] [address] [phone] Mon-Fri 7:30-5:30 Medicaid - Yes; Ailene Rud yes  [place] [phone] & [phone])  [facility], MD [address], [place], [place] [address] [phone] Mon-Thur 8:00-6:00, closed for lunch 12-2, closed Fridays Medicaid - yes; Tricare - no  [facility] Dareen Piano, NP; Cyndia Bent, MD; [place], [place]; [place], [place] [address], [place], [place] [address] [phone] Mon-Fri 7:30-4:30 Medicaid - yes, Tricare - yes  [facility]  Juanito Doom, MD; Janene Harvey, NP; Vonita Moss, MD; Donn Pierini, NP [address], [place], [place] [address] [phone] Mon-Fri 8:30-5:00, closed for lunch 1-2, Sat 8:30-12:00 - sick visits only Providers come to see babies at [facility] Only accepting newborns of siblings and first time parents ONLY if who have met with office prior to delivery Medicaid -Yes; Tricare - yes  [facility] - [place], [place]; Spero Geralds, NP; Earlene Plater, MD; Lucretia Roers, MD:  [address], [place], [place] [address] [phone] Mon- Fri 8:00-5:00, Sat 9:00-12:00 - sick visits only Accepting siblings of established patients and first time mom/baby Medicaid - Yes; Tricare - yes Patients must have vaccinations (baby vaccines)  [place] [phone] & [phone])  Adult nurse HealthCare at [facility] [address] [place], [place] [address] [phone] Mon-Fri 8:00-5:00 Medicaid - no; Tricare - yes  [facility] Medicine [place], MD; [place], [place]; [place], [place] [address], [place], [place] [address] [phone] Mon-Fri 8:00-5:00 Medicaid- yes; Tricare - yes  [facility] Family [facility] - Ardeen Jourdain, MD; Yetta Barre, NP; [place], [place] [address] [place], [place], [place] [address] [phone] Mon-Fri 8:00-5:00 Medicaid - Yes; Tricare - yes  [address] [place] [phone])  [facility] [place], [place]; [place], [place]; Eddie Candle, MD; Normand Sloop, MD; [place], NP; [place], DO; [place], [place]; Constance Goltz, MD; Ruthann Cancer, MD; Vear Clock, MD; [place], [place]; [place], [place]; [place], [place] [address] [facility] [address], [place], [place] [address] [phone] Mon-Fri 8:30-5:00, Sat 9:00-12:00 - sick only Please register online [email or web address] then schedule online or call office Medicaid-Yes; Tricare -yes  [facility] - [place]  [place], MD; Romualdo Bolk, MD; [place], MD; [place], NP; [place], [place]; Antonietta Barcelona, MD; Mayford Knife, NP; Shelva Majestic, MD [address], [place], [place] [address] [phone] Mon-Fri 8:00-5:30, Sat&Sun by appointment (phones open at 8:30) Medicaid - Yes; Tricare - yes  [place] [phone] & [phone]) [facility] Mariel Aloe; [place], MD; Roger Shelter, MD; Arvilla Market, NP; [place], DO [address], [place], [place] [address] [phone] M-F 8:00 - 5:15, Sat/Sun 9-12 sick visits only Medicaid - No; Tricare - yes  [facility] Family Medicine  [place], PA-C; [place], PA-C; [place], DO; [place], PA-C; [place], PA-C; Roselyn Bering, MD [address], [place], [place] [address] [phone] Mon-Thur 8:00-7:00, Fri 8:00-5:00 Accepting Medicaid for 13 and under only   [facility] - Family Medicine at [place] ([facility] [place]) [place], [place]; List, FNP; Berneda Rose, MD; Luther Redo, PA-C; Lavonia Drafts, MD; Kellie Simmering, FNP; Genevie Cheshire, FNP; Evaristo Bury, MD; Berneda Rose, MD [phone] [address], [place], [place]  [address] [phone] Mon-Fri 8:30-5:30 Medicaid - Yes; Tricare - yes  [facility] Pediatrics - [address]  [place], [place]; Whitney Post, MD; Hennie Duos, MD; Wynne Dust, MD; [place], NP [address], [place], [place] [address] [phone] Mon-Thur 8:00-5:30, Fri 8:00-5:00, Sat 9:00-12:00 Medicaid - yes, Tricare - yes  [place] [phone])  [facility], [place]; Lenise Arena, MD; [place], [place] [address], [place], [place] [address] [phone] Mon-Fri 8:00-5:00, closed for lunch 12-1 Medicaid - No; Tricare - yes  Nature conservation officer at [facility], MD [address] [place], [place] [address] [phone] Mon-Fri 8:00-5:00 Medicaid - No; Tricare - yes  [facility] - [facility], MD; Tami Ribas, MD; Mariam Dollar, MD; Yetta Barre, MD [address], [place], [place] [address] [phone] Mon-Fri 8:00-5:00 Medicaid- Yes; Tricare - yes  [place] [phone])  Adult nurse HealthCare at [facility], [place]; [place], MD [address] [place], [place], [place] [address] [phone] Mon-Fri 8:00-5:00 Medicaid - No; Tricare - yes  [facility] Family Medicine - Whitney Post - CPNP [address] [place], [place], [place]  [address] [phone] Mon-Weds 8:00-6:00, Thurs-Fri 8:00-5:00, Sat 9:00-12:00 Medicaid - yes; Tricare - yes   [facility] Katharina Caper, MD; [place], [place] [address] [place], [place] [address] [phone] Mon-Fri 8:00-5:00 Medicaid - yes; Tricare - yes  [facility] Pediatric Providers  [facility] [address] [place], [place] [address] [phone] Sheral Flow: 8am -8pm, Tues, Weds: 8am - 5pm; Fri: 8-1 Medicaid - Yes; Tricare - yes  [facility] Rachel Bo, MD; Laural Benes, MD; Anner Crete, MD; [place], [place]; [place], [place] [address], [place], [place] [address] [phone] M-F 8:30 - 5:00 Medicaid - Call  office; Tricare -yes  [facility] Edson Snowball, MD; Shanon Rosser, MD, Chelsea Primus, MD; Shirlyn Goltz, PNP; Wardell Heath, NP [phone] [address], [place], [place] [address] [phone] M-F 8:30 - 5:00, Sat/Sun 8:30 - 12:30 (sick visits) Medicaid - Call office; Tricare -yes  [facility] Melvyn Neth, MD; Karl Luke, PNP; Princess Bruins, MD; [place], [place]; [place], NP; Cynda Familia [address], [place], [place] [address] [phone] M-F 8:30 - 5:00 Medicaid - Call office; Tricare - yes  [facility] Jesusita Oka, MD; Dierdre Highman, MD; Earnest Conroy, MD; Timothy Lasso, MD; [place], MD [phone] [address], [place], [place] [address] [phone] M-Thur: 8:00 - 5:00; Fri: 8:00 - 4:00 Medicaid - yes; Tricare - yes  [facility] [address] [place], [place] [address] [phone] M-F: 8:30- 5:00, closed for lunch 12:30 - 1:00 Medicaid - yes; Tricare -yes  [facility] [address], [place], [place] [address] [phone] M-F 8:00 - 5:00 Medicaid - yes; Tricare - yes  [facility] [place], DO; [place], DO; [place], NP [address] [place], [place] [address] [phone] M-F 8:00 - 5:00, Closed 12-1 for lunch Medicaid - Call; Tricare - yes  [facility] Pediatrics Meredith Mody, MD [address] [place], [place] [address] [phone] M-F: 8:00-5:00, Sat: 8:00 - noon Medicaid - call; Tricare -yes  [facility] Providers  [facility] [place], [place] [address] [place], [place], [place] [address] [phone] M-W: 8:00-5:00, Thur: 8:00 - 7:00, Fri: 8:00 - noon Medicaid - yes; Tricare - yes  [facility] - Quay Burow, FNP [address] [place] [address] [place], [place] [address] [phone] M-F 8:00 - 5:00, Closed for lunch 12-1 Medicaid - yes; Tricare - yes  [facility] Pediatric Providers  [facility] Primary Care at [place], [place], Alinda Money, MD, [place], FNP-C [address], [facility], [address], [place], [place] [address] [phone] M-T 8:00-5:00, Wed-Fri 7:00-6:00 Medicaid - Yes; Tricare -yes  [facility] Medicine at [place] [facility], DO; [address] [place], [place] [address] [phone] M-F 8:00 - 5:00, closed for lunch 12-1 Medicaid - Yes; Tricare - yes  [facility]s and Internal Medicine  Zachery Dauer, MD; [place] Lighter, MD; Collie Siad, MD; Freda Jackson, MD; Rich Number, MD; Darryl Nestle, MD; Melinda Crutch, MD, Audria Nine, MD; Tawanna Cooler, MD; Steffanie Dunn, MD; Byrd Hesselbach, MD; Lucretia Roers, MD [address], [place], [place] [address] [phone] M-F 8:00-5:00 Medicaid - yes; Tricare - yes  [facility] [place], MD (speaks [place] and Hindi) [address] [place], [place] [address] [phone] M-F: 8:30 - 5:00, closed 12:30 - 1 for lunch Medicaid - Yes; Tricare -yes  [facility] Pediatric Providers  [facility] Shanda Bumps, MD; Chanetta Marshall, MD; Laurell Josephs, MD [address], [place], [place] [address] [phone] M-Th: 8:00 - 5:30, Fri: 8:00 - 12:00 Medicaid - yes; Tricare - yes  [facility]  Pediatrics at [facility], NP; Thora Lance, MD; Orrin Brigham, MD [phone] [address] [place], [place] [address] [phone] M-F: 8:00 - 5:00 Medicaid - yes; Tricare - yes  [facility] [place], NP; Orson Slick, NP; Salley Scarlet, NP; Linton Flemings, MD; Mayford Knife, MD, [place], NP, Emelda Fear, MD; Nida Boatman [address], [place], [place] [address] [phone] M-F: 8:30 - 5:30p Medicaid - yes; Tricare - yes Other locations available as well  [facility], MD; Andrey Campanile, MD; [address] Route, PA-C [address], [place], [place] [address] [phone] M-W: 8:00am - 7:00pm, Thurs: 8:00am - 8:00pm; Fri: 8:00am - 5:00pm, closed daily from 12-1 for lunch Medicaid - yes; Tricare - yes  [facility] Pediatric Providers  [facility] at Levin Erp, MD; Aggie Cosier, FNP;  Bland Span, MD; Tristan Schroeder, MD; [place], PNP; Alesia Banda; [place], [place]; Julian Reil, MD;  [address], [place], [place] [address] [phone] Judie Petit - Caleen Essex: 8am - 5pm, Sat 9-noon Medicaid - Yes; Tricare -yes  [facility] at Jaclynn Guarneri, MD; Yetta Barre, FNP; Lilian Kapur, MD; Mariam Dollar, MD [address] [place], JS[address] [phone] M-F 8:00 - 5:00 Medicaid - call; Tricare - yes  [facility]- Cruz Condon, MD; [place], [place]; Delora Fuel, MD; Dareen Piano, MD; Trudee Grip, MD; Kizzie Ide, MD; Zebedee Iba; [place] Crandall, MD; Hinton Dyer, MD; [place], MD [address] [place], [place] [address] [phone] M-F 8:00am - 5:00pm; Sat. 9:00 - 11:00 Medicaid - yes; Tricare - yes  [facility] at [facility], MD [address], [place], [place] [address] [phone] M-F 8:00 - 5:00 Medicaid - [place] Medicaid only; Tricare - yes  [facility] - Illene Bolus, MD; Earlene Plater, [place]; Kenyon Ana, MD [address], [place], [place] [address] [phone] M-F 8:00 - 5:00 Medicaid - yes; Tricare - yes  [facility] [address] Pediatrics - Lind Covert, MD; Manson Passey, MD, [facility], MD, Winston, MD; [place], MD; Katrinka Blazing, MD; [address] [place], [place] [address] [phone] M-F: 8-5 Medicaid - yes; Tricare - yes  [facility] - Henrietta Hoover, [place]; [place], MD; [address], [place], [place] [address] [phone] M-F 8-5 Medicaid - yes; Tricare - yes  [address] [place] Darrol Poke, MD; Tami Ribas, MD; [place], MD; [place], DNP; [place], PNP [address], [place], [place] [address] [phone] M-F 8-5 Medicaid - yes; Tricare - yes  [facility] Internal Medicine and Pediatrics Delories Heinz, MD; Adrienne Mocha; Ala Bent, MD [address], [place], [place] [address] [phone] M-F 7am - 5 pm Medicaid - call; Tricare - yes  [facility] [place], [place]; Fredia Beets, MD; Roxan Hockey, MD [address] [place], [place] [address] [phone] M-F 8-5 Medicaid - yes; Tricare - yes  [facility] Kae Heller, MD; Sheliah Hatch, MD; Mayford Knife, FNP; Shon Baton, FNP; Tyron Russell, FNP; Ishmael Holter; [facility] [facility] - FNP [address] [place], [place] [address] [phone] M-F 8-5 Medicaid- yes; Tricare - yes  [facility] - Betsy Coder, Lively and Chalmers Guest, MD; Terrial Rhodes, MD; Hulda Humphrey, MD; Roseanne Reno, MD; [place], [place]; Ala Dach, MD; Fredia Beets, MD; Dimple Casey, MD [address], [place], [place] [address] [phone] M-F: 8-5, Sat: 9-4, Sun 9-12 Medicaid - yes; Tricare - yes  [facility] [place], PNP; Earlene Plater, PNP [address] [place], [place] [address] [phone] M-F 8 - 5, closed 12-1 for lunch Medicaid - yes; Tricare - yes  [facility] Kathyrn Lass, MD; Hal Neer, MD; Dimple Casey,  MD; Allena Katz, DO [address] [place], [place] [address] [phone] M-F 8- 5:30 Medicaid - yes; Tricare - yes  [facility] - Biagio Quint, MD; Rosalia Hammers, MD; Gwenith Daily, MD [address] [place], [place] [address] [phone] Judie Petit: Nicholas Lose; Tues-Fri: 8-5; Sat: 9-12 Medicaid - yes; Tricare - yes  [facility] Heck[facility] - Bobbye Morton, MD; Daphane Shepherd, MD; Chestine Spore, MD; Haskell Riling, MD; Kate Sable, MD [address] [place], [place] [address] [phone] Judie PetitNicholas LoseFrancee Nodal: 8-5; Sat: 8:30-12:30 Medicaid - yes; Tricare - yes  [facility]lena Heckle [facility] - Beckey Rutter, MD; [place], [place] [address] [place], [place] [address] [phone] Mon-Fri: 8-5 Medicaid - yes; Tricare - yes  [facility] - [place], CPNP; [place], [place]; Dimple Casey, MD; Alisa Graff, MD; Cephus Shelling, MD; [address],  [place], [place] [address] [phone] M-F: 8-5, closed 1-2 for lunch Medicaid - yes; Tricare - yes  [facility] - [facility] [place], [place]; [place], [place]; Katrinka Blazing, MD; [place], CPNP; [place], [place]; [place], MD; Earlene Plater, MD [address], [place], [place] [address] [phone] M-Thurs: Nicholas Lose; Fri: 8-6; Sat: 9-12; Sun 2-4 Medicaid - yes; Tricare - yes  [facility] Georgeanna Lea, MD; Evette Cristal, MD; Shea Stakes, FNP; Earney Mallet, DO; [address], [place], [place] [address] [phone] M-F: 8-5 Medicaid - yes; Tricare - yes  [facility] Pediatric Providers  [facility] -Collene Mares, MD; [place], NP [address] [place], [place] [address] [phone] M - Fri: 8am - 5pm, closed for lunch 12-1 Medicaid - Yes; Tricare - yes  [facility] Elinor Parkinson, MD; Victory Dakin, MD; [place], DO; Vinocur, MD;Hall, PA; Cl[place], [place]; Orvan Falconer, NP [phone] [address] [place], [place] [address] [phone] M-F 8:00 - 5:00, Sat 8:00 - 11:30 Medicaid - yes; Tricare - yes  [facility] Welton Flakes, MD; [place], MD, [address] [place] [address] MD, [place], MD, [place], MD; [place], NP; [place], [place];  [address], [place], [place] [address] [phone] M-F 8:10am - 5:00pm Medicaid - yes; Tricare - yes  [facility] Eustaquio Boyden, MD; [place], NP [address], [place], [place] [address] [phone] M-F 8:00 - 5:00 Medicaid - [place] Medicaid only; Tricare - yes  [facility] - [address] [place], MD; [place], NP [address], [place], [place] [address] [phone] M-F 8:00 - 5:00; Closed for lunch 12 - 1:00 Medicaid - yes; Tricare - yes  [facility] Belva Crome, MD; Jonita Albee, FNP [address] [place], [place] [address] [phone] Mon 9-5; Tues/Wed 10-5; Thurs 8:30-5; Fri:  8-12:30 Medicaid - yes; Tricare - yes  [facility] Pediatric Providers  [facility]  [place], MD; [place], [place] [address] [place], [place] [address] [phone] phone [phone] fax M-F 7:15 - 4:30 Medicaid - yes; Tricare - yes  [facility] Karilyn Cota, MD; [place], DO [address], [place], [place] [address] [phone] M-Fri: 8:30 - 5:00, closed for lunch everyday noon - 1pm Medicaid - Yes; Tricare - yes  [facility] Burd[place], MD; Reuel Boom, MD; Dimas Aguas, MD; Neita Carp, MD; [place], [place]; Bonnita Nasuti, [place]; [place], [place]; [place], [place]; [place], [place] [address] [place], [place] [address] [phone] M-Thurs: 7:30am - 7:00pm; Friday 7:30am - 4pm; Sat: 8:00 - 1:00 Medicaid - Yes;  Tricare - yes  [facility] Nor[facility] Morton, MD; Conni Elliot, MD; Carroll Kinds, MD; [place], [place] [address], [place], [place] [address] [phone] M-Thur: 8:00 - 5:00, Fri: 8:00 - Noon Medicaid - yes; Tricare - yes No [place] [facility]  [place] [facility] [place], MD; Nadine Counts, DO; [place], NP; Daphine Deutscher, NP; Lequita Halt, NP; Ellamae Sia, NP; Reginia Forts, NP; Darlyn Read, MD; [place], [place] [address], [place], [place] [address] [phone] M-F 8:00 - 5:00 Medicaid - yes; Tricare - yes  [facility], FNP-C; [place], FNP-C [address] [place], [place] [address] [phone] M, W, R 8:00-5:00, Tues: 8:00am - 7:00pm; Fri 8:00 - noon Medicaid - Yes; Tricare - yes  [facility], MD [address] [place], [place] [address] [phone]  M-Thurs 8:30-5:30, Fri: 8:30-12:30pm Medicaid - Yes; Tricare - N

## 2023-12-29 ENCOUNTER — Other Ambulatory Visit: Payer: Self-pay | Admitting: *Deleted

## 2023-12-29 DIAGNOSIS — O99213 Obesity complicating pregnancy, third trimester: Secondary | ICD-10-CM

## 2024-01-01 ENCOUNTER — Inpatient Hospital Stay (HOSPITAL_COMMUNITY)
Admission: AD | Admit: 2024-01-01 | Discharge: 2024-01-01 | Disposition: A | Discharge status: Home / Self Care | Payer: 59 | Attending: Obstetrics & Gynecology | Admitting: Obstetrics & Gynecology

## 2024-01-01 ENCOUNTER — Encounter (HOSPITAL_COMMUNITY): Payer: Self-pay | Admitting: Obstetrics & Gynecology

## 2024-01-01 ENCOUNTER — Other Ambulatory Visit: Payer: Self-pay

## 2024-01-01 DIAGNOSIS — O26893 Other specified pregnancy related conditions, third trimester: Secondary | ICD-10-CM | POA: Insufficient documentation

## 2024-01-01 DIAGNOSIS — U071 COVID-19: Secondary | ICD-10-CM | POA: Diagnosis not present

## 2024-01-01 DIAGNOSIS — O98513 Other viral diseases complicating pregnancy, third trimester: Secondary | ICD-10-CM | POA: Insufficient documentation

## 2024-01-01 DIAGNOSIS — O368130 Decreased fetal movements, third trimester, not applicable or unspecified: Secondary | ICD-10-CM | POA: Insufficient documentation

## 2024-01-01 DIAGNOSIS — R051 Acute cough: Secondary | ICD-10-CM | POA: Diagnosis not present

## 2024-01-01 DIAGNOSIS — Z3A35 35 weeks gestation of pregnancy: Secondary | ICD-10-CM | POA: Insufficient documentation

## 2024-01-01 DIAGNOSIS — R0981 Nasal congestion: Secondary | ICD-10-CM | POA: Insufficient documentation

## 2024-01-01 LAB — URINALYSIS, ROUTINE W REFLEX MICROSCOPIC
Bilirubin Urine: NEGATIVE
Glucose, UA: NEGATIVE mg/dL
Hgb urine dipstick: NEGATIVE
Ketones, ur: NEGATIVE mg/dL
Leukocytes,Ua: NEGATIVE
Nitrite: NEGATIVE
Protein, ur: NEGATIVE mg/dL
Specific Gravity, Urine: 1.004 — ABNORMAL LOW (ref 1.005–1.030)
pH: 7 (ref 5.0–8.0)

## 2024-01-01 MED ORDER — HYDROCOD POLI-CHLORPHE POLI ER 10-8 MG/5ML PO SUER: 5.0000 mL | Freq: Two times a day (BID) | ORAL | 0 refills | Status: DC | PRN

## 2024-01-01 NOTE — Discharge Instructions (Signed)

## 2024-01-01 NOTE — MAU Note (Signed)
.  Catherine [name] is a 23 y.o. at [redacted]w[redacted]d here in MAU reporting: not feeling good since Saturday night - took a nap and woke up with cough and sore throat. Took robitussin with no relief - felt worse on Sunday. Around 0245 took at home covid test and it was positive. Reports having chest pains, SOB, chills, runny nose and hot flashes. Reports DFM - "she moves but not a lot" - last movement felt an hour ago and states it was just one time. Denies VB or LOF. Called OB nurse line and was told to come in.   Pain score: 8 - throat; 8 - chest (pressure) Vitals:   01/01/24 0401  BP: (!) 133/92  Pulse: (!) 118  Resp: 20  Temp: 98.1 F (36.7 C)  SpO2: 99%     FHT: 130  Lab orders placed from triage: UA

## 2024-01-01 NOTE — MAU Provider Note (Signed)
History     CSN: [id number]  Arrival date and time: 01/01/24 0342   Event Date/Time   First Provider Initiated Contact with Patient 01/01/24 [phone]      Chief Complaint  Patient presents with   Shortness of Breath   Chest Pain   Decreased Fetal Movement   Cough   Chills        HPI Catherine [name] is a 23 y.o. year old [id number] female at [redacted]w[redacted]d weeks gestation who presents to MAU reporting she had a baby shower on Saturday during the day and started not feeling well that night.  She states that she took a nap and woke up with a cough and sore throat.  She took Robitussin with no relief.  She states that on Sunday she started feeling worse.  At 2:45 AM this morning she took a home COVID test; with positive results.  She reports having chest pains, shortness of breath, chills, runny nose, and hot flashes.  She also reports decreased fetal movement.  She states "she moves but not a lot."  The last fetal movement she felt was an hour prior to arrival in MAU; "that was just 1 time."  She denies vaginal bleeding or loss of fluid.  She called the OB/GYN nurse line and was told to come in.  She rates the pain in her throat 8/10 and pain in her chest as pressure 8/10. [facility]; next appt is 01/04/2024. Her partner is present and contributing to the history taking.    OB History     Gravida  1   Para  0   Term  0   Preterm  0   AB  0   Living  0      SAB  0   IAB  0   Ectopic  0   Multiple  0   Live Births  0           Past Medical History:  Diagnosis Date   Allergic rhinitis 04/01/2010   Qualifier: Diagnosis of   By: Severiano Gilbert MD, Lori         Allergy    Anxiety    Asthma    past hx   Blanching rash 10/19/2022   Childhood asthma, mild intermittent, uncomplicated 02/01/2007   Qualifier: Diagnosis of   By: Jeanice Lim MD, Kingsley Spittle         Depression    Family history of brain aneurysm- father died around age 22  04/04/20   Health care  maintenance 04/04/20   Care Gaps     High risk heterosexual behavior 04/04/20   History of anemia Apr 04, 2020   Menorrhagia with irregular cycle 04/04/20   Migraine    Pelvic pain 04/04/2020   Polyuria 2020/04/04   Right lower quadrant pain 04-04-20    Past Surgical History:  Procedure Laterality Date   TONSILLECTOMY Bilateral 08/11/2015   Procedure: CONTROL POST TONSILLECTOMY BLEEDING ;  Surgeon: Vernie Murders, MD;  Location: [facility] ORS;  Service: ENT;  Laterality: Bilateral;   TONSILLECTOMY AND ADENOIDECTOMY N/A 07/31/2015   Procedure: TONSILLECTOMY AND ADENOIDECTOMY;  Surgeon: Linus Salmons, MD;  Location: [facility];  Service: ENT;  Laterality: N/A;   WISDOM TOOTH EXTRACTION      Family History  Problem Relation Age of Onset   Heart disease Mother    Bipolar disorder Mother    Depression Mother    Cancer Mother    Schizophrenia Mother    Asthma  Father    Hypertension Father    Aneurysm Father    Hypertension Brother    Diabetes Brother    Breast cancer Maternal Grandmother    Cancer Maternal Grandmother    Lung disease Maternal Grandmother    Heart disease Maternal Grandfather    Heart attack Maternal Grandfather    Aneurysm Paternal Grandfather     Social History   Tobacco Use   Smoking status: Never    Passive exposure: Never   Smokeless tobacco: Never  Vaping Use   Vaping status: Never Used  Substance Use Topics   Alcohol use: Not Currently    Comment: once per month, 1-2 drinks   Drug use: Not Currently    Comment: last use feb 2024    Allergies:  Allergies  Allergen Reactions   Amoxicillin Hives    Medications Prior to Admission  Medication Sig Dispense Refill Last Dose/Taking   aspirin EC 81 MG tablet Take 81 mg by mouth daily. Swallow whole.   12/31/2023   prenatal vitamin w/FE, FA (PRENATAL 1 + 1) 27-1 MG TABS tablet Take 1 tablet by mouth daily at 12 noon.   12/31/2023   NIFEdipine (PROCARDIA) 10 MG capsule Take 1 capsule (10  mg total) by mouth 3 (three) times daily as needed (more than 6 contractions per hour). (Patient not taking: Reported on 12/26/2023) 90 capsule 0     Review of Systems  Constitutional: Negative.   HENT: Negative.    Eyes: Negative.   Respiratory:  Positive for cough and shortness of breath.   Cardiovascular:  Positive for chest pain ("pressure").  Gastrointestinal: Negative.   Endocrine: Negative.   Genitourinary: Negative.   Musculoskeletal: Negative.   Skin: Negative.   Allergic/Immunologic: Negative.   Neurological: Negative.   Hematological: Negative.   Psychiatric/Behavioral: Negative.     Physical Exam   Blood pressure 133/86, pulse (!) 108, temperature 98.1 F (36.7 C), temperature source Oral, resp. rate 20, height 5\' 3"  (1.6 m), weight 124.3 kg, last menstrual period 04/29/2023, SpO2 99%.  Physical Exam Vitals and nursing note reviewed.  Constitutional:      Appearance: Normal appearance. She is obese.  HENT:     Nose: Congestion present.  Cardiovascular:     Rate and Rhythm: Tachycardia present.     Heart sounds: Normal heart sounds.  Pulmonary:     Effort: Pulmonary effort is normal.     Breath sounds: Normal breath sounds.  Abdominal:     Palpations: Abdomen is soft.  Genitourinary:    Comments: deferred Musculoskeletal:        General: Normal range of motion.  Skin:    General: Skin is warm and dry.  Neurological:     Mental Status: She is alert and oriented to person, place, and time.  Psychiatric:        Mood and Affect: Mood normal.        Behavior: Behavior normal.        Thought Content: Thought content normal.        Judgment: Judgment normal.    REACTIVE NST - FHR: 135 bpm / moderate variability / accels present / decels absent / TOCO: Occ UCs noted  MAU Course  Procedures  MDM Offered to start on Paxlovid. Explained r/b.  Explained not much to do as far as management in MAU  Assessment and Plan  1. COVID-19 affecting pregnancy in  third trimester (Primary) - Quarantine until Friday 01/05/2024 - Advised partner get tested and quarantine  also - Information provided on COVID-19 & pregnancy  - FKC discussed - Abruption precautions discussed  2. Nasal congestion - Safe Meds in Pregnancy List provided  3. Acute cough - Safe Meds in Pregnancy List provided - prescription for: Tussionex 5 mL BID prn cough - Continue with Robitussin prn  4. [redacted] weeks gestation of pregnancy   - Discharge patient - Message sent to [facility] to move patient's next appt to Friday 01/05/2024 - Patient verbalized an understanding of the plan of care and agrees.    Raelyn Mora, CNM 01/01/2024, 4:38 AM

## 2024-01-03 ENCOUNTER — Encounter: Payer: Self-pay | Admitting: Obstetrics & Gynecology

## 2024-01-04 ENCOUNTER — Telehealth (INDEPENDENT_AMBULATORY_CARE_PROVIDER_SITE_OTHER): Payer: 59 | Admitting: Obstetrics & Gynecology

## 2024-01-04 ENCOUNTER — Encounter: Payer: Self-pay | Admitting: Obstetrics & Gynecology

## 2024-01-04 ENCOUNTER — Other Ambulatory Visit: Payer: 59

## 2024-01-04 VITALS — BP 133/86 | HR 97

## 2024-01-04 DIAGNOSIS — U071 COVID-19: Secondary | ICD-10-CM | POA: Diagnosis not present

## 2024-01-04 DIAGNOSIS — Z3A35 35 weeks gestation of pregnancy: Secondary | ICD-10-CM

## 2024-01-04 DIAGNOSIS — O98513 Other viral diseases complicating pregnancy, third trimester: Secondary | ICD-10-CM | POA: Diagnosis not present

## 2024-01-04 DIAGNOSIS — O99213 Obesity complicating pregnancy, third trimester: Secondary | ICD-10-CM | POA: Diagnosis not present

## 2024-01-04 DIAGNOSIS — O0993 Supervision of high risk pregnancy, unspecified, third trimester: Secondary | ICD-10-CM

## 2024-01-04 NOTE — Patient Instructions (Signed)
Return to office for any scheduled appointments. Call the office or go to the MAU at [facility] if: You begin to have strong, frequent contractions Your water breaks.  Sometimes it is a big gush of fluid, sometimes it is just a trickle that keeps getting your underwear wet or running down your legs You have vaginal bleeding.  It is normal to have a small amount of spotting if your cervix was checked.  You do not feel your baby moving like normal.  If you do not, get something to eat and drink and lay down and focus on feeling your baby move.   If your baby is still not moving like normal, you should call the office or go to MAU. Any other obstetric concerns.     RSV Vaccination for Pregnant People  CDC recommends two ways to protect babies from getting very sick with Respiratory Syncytial Virus (RSV):  An RSV vaccination given during pregnancy  Pfizer's vaccine Verdis Frederickson) is recommended for use during pregnancy. It is given during RSV season to people who are 32 through [redacted] weeks pregnant.  Or, An RSV immunization given directly to infants and some older babies  Babies born to mothers who get RSV vaccine at least 2 weeks before delivery will have protection and, in most cases, should not need an RSV immunization later.    When is RSV season?  In most regions of the [place] RSV season starts in the fall and peaks in the winter, but the timing and severity of RSV season can vary from place to place and year to year.   The goal of maternal RSV vaccination is to protect babies from getting very sick with RSV during their first RSV season.  In most of the [place], this means maternal RSV vaccine will be given in September through January.  Who should get the maternal RSV vaccine?  People who are 79 through [redacted] weeks pregnant during September through January should get one dose of maternal RSV vaccine to protect their babies. RSV season  can vary around the country.   How is the maternal RSV vaccine administered?  Maternal RSV vaccine is given as a shot into the mother's upper arm. Only a single dose (one shot) of maternal RSV vaccine is recommended.   It is not yet known whether another dose might be needed in later pregnancies.  How well does the maternal RSV vaccine work?  When someone gets RSV vaccine, their body responds by making a protein that protects against the virus that causes RSV. The process takes about 2 weeks. When a pregnant person gets RSV vaccine, their protective proteins (called antibodies) also pass to their baby. So, babies who are born at least 2 weeks after their mother gets RSV vaccine are protected at birth, when infants are at the highest risk of severe RSV disease.   The vaccine can reduce a baby's risk of being hospitalized from RSV by 57% in the first six months after birth.  What are the possible side effects of the maternal RSV vaccine?  In the clinical trials, the side effects most often reported by pregnant people who received the maternal RSV vaccine were pain at the injection site, headache, muscle pain, and nausea.  Although not common, a dangerous high blood pressure condition called pre-eclampsia occurred in 1.8% of pregnant people who received the maternal RSV vaccine compared to 1.4% of pregnant people who received a placebo.  The clinical  trials identified a small increase in the number of preterm births in vaccinated pregnant people. It is not clear if this is a true safety problem related to RSV vaccine or if this occurred for reasons unrelated to vaccination.  To reduce the potential risk of preterm birth and complications from RSV disease, FDA approved the maternal RSV vaccine for use during weeks 32 through 30 of pregnancy while additional studies are conducted.  FDA is requiring the manufacturer to do additional studies that will look more closely at the potential risk of  preterm births and pregnancy-related high blood pressure issues in mothers, including pre-eclampsia.  Severe allergic reactions to vaccines are rare but can happen after any vaccine and can be life-threatening. If you see signs of a severe allergic reaction after vaccination (hives, swelling of the face and throat, difficulty breathing, a fast heartbeat, dizziness, or weakness), seek immediate medical care by calling 911.  As with any medicine or vaccine there is a very remote chance of the vaccine causing other serious injury or death after vaccination.  Adverse events following vaccination should be reported to the Vaccine Adverse Event Reporting System (VAERS), even if it's not clear that the vaccine caused the adverse event. You or your doctor can report an adverse event to [employer] and FDA through VAERS. If you need further assistance reporting to VAERS, please email [email or web address] .org or call [phone] [phone].  If you have any questions about side effects from the maternal RSV vaccine, talk with your healthcare provider.  Do I need a prescription for a maternal RSV vaccine?  Until the vaccine available in the office, you will need a prescription to take to a local pharmacy that is providing the vaccine.   How do I pay for the maternal RSV vaccine?  Most private health insurance plans cover the maternal RSV vaccine, but there may be a cost to you depending on your plan.  Contact your insurer to find out.  Medicaid Beginning September 04, 2022, most people with coverage from [facility] and [employer] [facility]) will be guaranteed coverage of all vaccines recommended by the Advisory Committee on Immunization Practice at no cost to them.   Source: [facility] for Immunization and Respiratory Diseases

## 2024-01-04 NOTE — Progress Notes (Signed)
OBSTETRICS PRENATAL VIRTUAL VISIT ENCOUNTER NOTE  Provider location: Center for [facility] at [facility]   Patient location: Home  I connected with Catherine [name] on 01/04/24 at  1:30 PM EST by MyChart Video Encounter and verified that I am speaking with the correct person using two identifiers. I discussed the limitations, risks, security and privacy concerns of performing an evaluation and management service virtually and the availability of in person appointments. I also discussed with the patient that there may be a patient responsible charge related to this service. The patient expressed understanding and agreed to proceed. Subjective:  Catherine [name] is a 23 y.o. G1P0000 at [redacted]w[redacted]d being seen today for ongoing prenatal care.  She is currently monitored for the following issues for this high-risk pregnancy and has Vitamin D insufficiency; Body mass index (BMI) of 40.1-44.9 in adult [facility]); Migraine with aura and without status migrainosus, not intractable; Anxiety with depression; Lumbar disc herniation with radiculopathy; GERD (gastroesophageal reflux disease); Morbid obesity with BMI of 45.0-49.9, adult (HCC); Back pain affecting pregnancy; Supervision of high risk pregnancy in third trimester; Maternal varicella, non-immune; Headache in pregnancy, antepartum; Maternal morbid obesity, antepartum (HCC); and COVID-19 affecting pregnancy in third trimester on their problem list.  Patient reports  feeling better, recently had positive test and symptoms of COVID . No fevers, no respiratory distress.  Contractions: Irritability. Vag. Bleeding: None.  Movement: Present. Denies any leaking of fluid.   The following portions of the patient's history were reviewed and updated as appropriate: allergies, current medications, past family history, past medical history, past social history, past surgical history and problem list.   Objective:   Vitals:   01/04/24 1201  BP: 133/86  Pulse: 97     Fetal Status:     Movement: Present     General:  Alert, oriented and cooperative. Patient is in no acute distress.  Respiratory: Normal respiratory effort, no problems with respiration noted  Mental Status: Normal mood and affect. Normal behavior. Normal judgment and thought content.  Rest of physical exam deferred due to type of encounter  Imaging: [place] MFM OB FOLLOW UP Result Date: 12/28/2023 ----------------------------------------------------------------------  OBSTETRICS REPORT                       (Signed Final 12/28/2023 04:12 pm) ---------------------------------------------------------------------- Patient Info  ID #:       [id number]                          D.O.B.:  [birth year] (22 yrs)(F)  Name:       Catherine [name]                    Visit Date: 12/28/2023 12:43 pm ---------------------------------------------------------------------- Performed By  Attending:        Noralee Space MD        Ref. Address:     [address]                                                             [address]  Performed By:     Isac Sarna        [facility]  Referred By:      [facility] ---------------------------------------------------------------------- Orders  #  Description                           Code        Ordered By  1  [place] MFM OB FOLLOW UP                   [phone]    Rosana Hoes  2  [place] MFM FETAL BPP WO NON               [id number]    YU FANG     STRESS ----------------------------------------------------------------------  #  Order #                     Accession #                Episode #  1  [id number]                   [id number]                 [id number]  2  [id number]                   [id number]                 [id number]  ---------------------------------------------------------------------- Indications  Obesity complicating pregnancy, third          O99.213  trimester (pregravid BMI 46)  Encounter for other antenatal screening        Z36.2  follow-up  Low risk NIPS; GTT wnl  [redacted] weeks gestation of pregnancy                Z3A.34 ---------------------------------------------------------------------- Fetal Evaluation  Num Of Fetuses:         1  Fetal Heart Rate(bpm):  130  Cardiac Activity:       Observed  Presentation:           Cephalic  Placenta:               Posterior  P. Cord Insertion:      Previously seen  Amniotic Fluid  AFI FV:      Within normal limits  AFI Sum(cm)     %Tile       Largest Pocket(cm)  13.56           46          5.39  RUQ(cm)  RLQ(cm)       LUQ(cm)        LLQ(cm)  4.2           5.39          0.92           3.05 ---------------------------------------------------------------------- Biophysical Evaluation  Amniotic F.V:   Pocket => 2 cm             F. Tone:        Observed  F. Movement:    Observed                   Score:          8/8  F. Breathing:   Observed ---------------------------------------------------------------------- Biometry  BPD:      93.1  mm     G. Age:  37w 6d       > 99  %    CI:        79.73   %    70 - 86                                                          FL/HC:      19.5   %    20.1 - 22.3  HC:      329.5  mm     G. Age:  37w 3d         82  %    HC/AC:      1.00        0.93 - 1.11  AC:      329.5  mm     G. Age:  36w 6d         96  %    FL/BPD:     69.0   %    71 - 87  FL:       64.2  mm     G. Age:  33w 1d          9  %    FL/AC:      19.5   %    20 - 24  LV:        2.4  mm  Est. FW:    2853  gm      6 lb 5 oz     84  % ---------------------------------------------------------------------- OB History  Gravidity:    1         Term:   0        Prem:   0        SAB:   0  TOP:          0       Ectopic:  0        Living: 0  ---------------------------------------------------------------------- Gestational Age  LMP:           34w 5d        Date:  04/29/23                  EDD:   02/03/24  U/S Today:     36w 2d  EDD:   01/23/24  Best:          34w 5d     Det. ByMarcella Dubs         EDD:   02/03/24                                      (07/02/23) ---------------------------------------------------------------------- Anatomy  Cranium:               Previously seen        Aortic Arch:            Previously seen  Cavum:                 Previously seen        Ductal Arch:            Previously seen  Ventricles:            Previously seen        Diaphragm:              Appears normal  Choroid Plexus:        Previously seen        Stomach:                Appears normal, left                                                                        sided  Cerebellum:            Previously seen        Abdomen:                Previously seen  Posterior Fossa:       Previously seen        Abdominal Wall:         Previously seen  Face:                  Orbits and profile     Cord Vessels:           Previously seen                         previously seen  Lips:                  Previously seen        Kidneys:                Appear normal  Thoracic:              Previously seen        Bladder:                Appears normal  Heart:                 Previously seen        Spine:                  Previously seen  RVOT:                  Previously seen  Upper Extremities:      Previously seen  LVOT:                  Previously seen        Lower Extremities:      Previously seen ---------------------------------------------------------------------- Impression  Fetal growth is appropriate for gestational age .Abdominal  circumference measurement is at the 94th percentile.  Amniotic fluid is normal and good fetal activity is seen  .Antenatal testing is reassuring. BPP 8/8.   Patient does not have gestational diabetes .  ---------------------------------------------------------------------- Recommendations  -An appointment was made for her to return in 4 weeks for  fetal growth assessment. ----------------------------------------------------------------------                 Noralee Space, MD Electronically Signed Final Report   12/28/2023 04:12 pm ----------------------------------------------------------------------   [place] MFM FETAL BPP WO NON STRESS Result Date: 12/28/2023 ----------------------------------------------------------------------  OBSTETRICS REPORT                       (Signed Final 12/28/2023 04:12 pm) ---------------------------------------------------------------------- Patient Info  ID #:       [id number]                          D.O.B.:  [birth year] (22 yrs)(F)  Name:       Catherine Melia Daddario                    Visit Date: 12/28/2023 12:43 pm ---------------------------------------------------------------------- Performed By  Attending:        Noralee Space MD        Ref. Address:     [address]                                                             [address]  Performed By:     Isac Sarna        [facility]  Referred By:      [facility] ---------------------------------------------------------------------- Orders  #  Description  Code        Ordered By  1  [place] MFM OB FOLLOW UP                   [id number]    Rosana Hoes  2  [place] MFM FETAL BPP WO NON               [id number]    YU FANG     STRESS ----------------------------------------------------------------------  #  Order #                     Accession #                Episode #  1  [id number]                   [id number]                 [id number]  2  [id number]                   [id number]                  [id number] ---------------------------------------------------------------------- Indications  Obesity complicating pregnancy, third          O99.213  trimester (pregravid BMI 46)  Encounter for other antenatal screening        Z36.2  follow-up  Low risk NIPS; GTT wnl  [redacted] weeks gestation of pregnancy                Z3A.34 ---------------------------------------------------------------------- Fetal Evaluation  Num Of Fetuses:         1  Fetal Heart Rate(bpm):  130  Cardiac Activity:       Observed  Presentation:           Cephalic  Placenta:               Posterior  P. Cord Insertion:      Previously seen  Amniotic Fluid  AFI FV:      Within normal limits  AFI Sum(cm)     %Tile       Largest Pocket(cm)  13.56           46          5.39  RUQ(cm)       RLQ(cm)       LUQ(cm)        LLQ(cm)  4.2           5.39          0.92           3.05 ---------------------------------------------------------------------- Biophysical Evaluation  Amniotic F.V:   Pocket => 2 cm             F. Tone:        Observed  F. Movement:    Observed                   Score:          8/8  F. Breathing:   Observed ---------------------------------------------------------------------- Biometry  BPD:      93.1  mm     G. Age:  37w 6d       > 99  %    CI:        79.73   %    70 - 86  FL/HC:      19.5   %    20.1 - 22.3  HC:      329.5  mm     G. Age:  37w 3d         82  %    HC/AC:      1.00        0.93 - 1.11  AC:      329.5  mm     G. Age:  36w 6d         96  %    FL/BPD:     69.0   %    71 - 87  FL:       64.2  mm     G. Age:  33w 1d          9  %    FL/AC:      19.5   %    20 - 24  LV:        2.4  mm  Est. FW:    2853  gm      6 lb 5 oz     84  % ---------------------------------------------------------------------- OB History  Gravidity:    1         Term:   0        Prem:   0        SAB:   0  TOP:          0       Ectopic:  0        Living: 0  ---------------------------------------------------------------------- Gestational Age  LMP:           34w 5d        Date:  04/29/23                  EDD:   02/03/24  U/S Today:     36w 2d                                        EDD:   01/23/24  Best:          34w 5d     Det. By:  Marcella Dubs         EDD:   02/03/24                                      (07/02/23) ---------------------------------------------------------------------- Anatomy  Cranium:               Previously seen        Aortic Arch:            Previously seen  Cavum:                 Previously seen        Ductal Arch:            Previously seen  Ventricles:            Previously seen        Diaphragm:              Appears normal  Choroid Plexus:        Previously seen        Stomach:  Appears normal, left                                                                        sided  Cerebellum:            Previously seen        Abdomen:                Previously seen  Posterior Fossa:       Previously seen        Abdominal Wall:         Previously seen  Face:                  Orbits and profile     Cord Vessels:           Previously seen                         previously seen  Lips:                  Previously seen        Kidneys:                Appear normal  Thoracic:              Previously seen        Bladder:                Appears normal  Heart:                 Previously seen        Spine:                  Previously seen  RVOT:                  Previously seen        Upper Extremities:      Previously seen  LVOT:                  Previously seen        Lower Extremities:      Previously seen ---------------------------------------------------------------------- Impression  Fetal growth is appropriate for gestational age .Abdominal  circumference measurement is at the 94th percentile.  Amniotic fluid is normal and good fetal activity is seen  .Antenatal testing is reassuring. BPP 8/8.   Patient does not have gestational diabetes .  ---------------------------------------------------------------------- Recommendations  -An appointment was made for her to return in 4 weeks for  fetal growth assessment. ----------------------------------------------------------------------                 Noralee Space, MD Electronically Signed Final Report   12/28/2023 04:12 pm ----------------------------------------------------------------------    Assessment and Plan:  Pregnancy: G1P0000 at [redacted]w[redacted]d 1. COVID-19 affecting pregnancy in third trimester (Primary) Recovering well, no current concerns. Continue supportive care.  2. Maternal morbid obesity, antepartum (HCC) TWG 14 lbs, doing well. Continue weekly antenatal screening.   3. [redacted] weeks gestation of pregnancy 4. Supervision of high risk pregnancy in third trimester Pelvic cultures, possible RSV vaccine next visit. Preterm labor symptoms and general obstetric precautions including but not limited to vaginal bleeding, contractions, leaking of  fluid and fetal movement were reviewed in detail with the patient. I discussed the assessment and treatment plan with the patient. The patient was provided an opportunity to ask questions and all were answered. The patient agreed with the plan and demonstrated an understanding of the instructions. The patient was advised to call back or seek an in-person office evaluation/go to [facility] at [facility] for any urgent or concerning symptoms. Please refer to After Visit Summary for other counseling recommendations.   I provided 10 minutes of face-to-face time during this encounter.  Return in about 1 week (around 01/11/2024) for OB visits and antenatal testing as scheduled.  Future Appointments  Date Time Provider Department Center  01/04/2024  1:30 PM Tereso Newcomer, MD [facility]  01/11/2024  1:10 PM [facility] NST [facility]  01/11/2024  1:30 PM Cherryl Babin, Jethro Bastos, MD [facility]  01/18/2024  1:10 PM [facility] NST  [facility]  01/18/2024  1:30 PM [place] Bing, MD [facility]  01/25/2024  7:30 AM [facility] US3 [facility]  01/25/2024 10:35 AM Abrahim Sargent, Jethro Bastos, MD [facility]    Jaynie Collins, MD [facility]

## 2024-01-05 DIAGNOSIS — Z3483 Encounter for supervision of other normal pregnancy, third trimester: Secondary | ICD-10-CM | POA: Diagnosis not present

## 2024-01-05 DIAGNOSIS — Z3482 Encounter for supervision of other normal pregnancy, second trimester: Secondary | ICD-10-CM | POA: Diagnosis not present

## 2024-01-11 ENCOUNTER — Other Ambulatory Visit (HOSPITAL_COMMUNITY)
Admission: RE | Admit: 2024-01-11 | Discharge: 2024-01-11 | Disposition: A | Discharge status: Home / Self Care | Payer: 59 | Source: Ambulatory Visit | Attending: Obstetrics & Gynecology | Admitting: Obstetrics & Gynecology

## 2024-01-11 ENCOUNTER — Ambulatory Visit (INDEPENDENT_AMBULATORY_CARE_PROVIDER_SITE_OTHER): Payer: 59 | Admitting: Obstetrics & Gynecology

## 2024-01-11 ENCOUNTER — Other Ambulatory Visit (INDEPENDENT_AMBULATORY_CARE_PROVIDER_SITE_OTHER): Payer: Self-pay

## 2024-01-11 ENCOUNTER — Ambulatory Visit: Payer: 59 | Admitting: *Deleted

## 2024-01-11 ENCOUNTER — Encounter: Payer: Self-pay | Admitting: Obstetrics & Gynecology

## 2024-01-11 VITALS — BP 138/88 | HR 97 | Wt 275.0 lb

## 2024-01-11 DIAGNOSIS — O0993 Supervision of high risk pregnancy, unspecified, third trimester: Secondary | ICD-10-CM

## 2024-01-11 DIAGNOSIS — Z3493 Encounter for supervision of normal pregnancy, unspecified, third trimester: Secondary | ICD-10-CM | POA: Diagnosis not present

## 2024-01-11 DIAGNOSIS — Z3A36 36 weeks gestation of pregnancy: Secondary | ICD-10-CM | POA: Diagnosis not present

## 2024-01-11 DIAGNOSIS — O99213 Obesity complicating pregnancy, third trimester: Secondary | ICD-10-CM | POA: Diagnosis not present

## 2024-01-11 DIAGNOSIS — O99210 Obesity complicating pregnancy, unspecified trimester: Secondary | ICD-10-CM | POA: Diagnosis not present

## 2024-01-11 NOTE — Progress Notes (Signed)
 PRENATAL VISIT NOTE  Subjective:  Catherine [name] is a 23 y.o. G1P0000 at [redacted]w[redacted]d being seen today for ongoing prenatal care.  She is currently monitored for the following issues for this high-risk pregnancy and has Vitamin D  insufficiency; Body mass index (BMI) of 40.1-44.9 in adult [facility]); Migraine with aura and without status migrainosus, not intractable; Anxiety with depression; Lumbar disc herniation with radiculopathy; GERD (gastroesophageal reflux disease); Morbid obesity with BMI of 45.0-49.9, adult (HCC); Back pain affecting pregnancy; Supervision of high risk pregnancy in third trimester; Maternal varicella, non-immune; Headache in pregnancy, antepartum; Maternal morbid obesity, antepartum (HCC); and COVID-19 affecting pregnancy in third trimester on their problem list.  Patient reports no complaints.  Contractions: Irritability. Vag. Bleeding: None.  Movement: Present. Denies leaking of fluid.   The following portions of the patient's history were reviewed and updated as appropriate: allergies, current medications, past family history, past medical history, past social history, past surgical history and problem list.   Objective:   Vitals:   01/11/24 1435  BP: 138/88  Pulse: 97  Weight: 275 lb (124.7 kg)    Fetal Status: Fetal Heart Rate (bpm): RNST   Movement: Present  Presentation: Vertex  General:  Alert, oriented and cooperative. Patient is in no acute distress.  Skin: Skin is warm and dry. No rash noted.   Cardiovascular: Normal heart rate noted  Respiratory: Normal respiratory effort, no problems with respiration noted  Abdomen: Soft, gravid, appropriate for gestational age.  Pain/Pressure: Present     Pelvic: Cervical exam performed in the presence of a chaperone Dilation: Closed Effacement (%): Thick Station: Ballotable, cultures obtained.  Extremities: Normal range of motion.     Mental Status: Normal mood and affect. Normal behavior. Normal judgment and thought content.    US  MFM OB FOLLOW UP Result Date: 12/28/2023 ----------------------------------------------------------------------  OBSTETRICS REPORT                       (Signed Final 12/28/2023 04:12 pm) ---------------------------------------------------------------------- Patient Info  ID #:       [id number]                          D.O.B.:  2001-02-23 (22 yrs)(F)  Name:       Catherine [name]                    Visit Date: 12/28/2023 12:43 pm ---------------------------------------------------------------------- Performed By  Attending:        Fredia Fresh MD        Ref. Address:     [address]                                                             [address]  Performed By:     Jonette Nap        [facility]                                  [facility]  Referred By:      [facility] ---------------------------------------------------------------------- Orders  #  Description                           Code        Ordered By  1  US  MFM OB FOLLOW UP                   [id number]    YU FANG  2  US  MFM FETAL BPP WO NON               76819.01    YU FANG     STRESS ----------------------------------------------------------------------  #  Order #                     Accession #                Episode #  1  [id number]                   [id number]                 [id number]  2  [id number]                   [id number]                 [id number] ---------------------------------------------------------------------- Indications  Obesity complicating pregnancy, third          O99.213  trimester (pregravid BMI 46)  Encounter for other antenatal screening        Z36.2  follow-up  Low risk NIPS; GTT wnl  [redacted] weeks gestation of pregnancy                Z3A.34 ---------------------------------------------------------------------- Fetal Evaluation   Num Of Fetuses:         1  Fetal Heart Rate(bpm):  130  Cardiac Activity:       Observed  Presentation:           Cephalic  Placenta:               Posterior  P. Cord Insertion:      Previously seen  Amniotic Fluid  AFI FV:      Within normal limits  AFI Sum(cm)     %Tile       Largest Pocket(cm)  13.56           46          5.39  RUQ(cm)       RLQ(cm)       LUQ(cm)        LLQ(cm)  4.2           5.39          0.92           3.05 ---------------------------------------------------------------------- Biophysical Evaluation  Amniotic F.V:   Pocket => 2 cm             F. Tone:        Observed  F. Movement:    Observed  Score:          8/8  F. Breathing:   Observed ---------------------------------------------------------------------- Biometry  BPD:      93.1  mm     G. Age:  37w 6d       > 99  %    CI:        79.73   %    70 - 86                                                          FL/HC:      19.5   %    20.1 - 22.3  HC:      329.5  mm     G. Age:  37w 3d         82  %    HC/AC:      1.00        0.93 - 1.11  AC:      329.5  mm     G. Age:  36w 6d         96  %    FL/BPD:     69.0   %    71 - 87  FL:       64.2  mm     G. Age:  33w 1d          9  %    FL/AC:      19.5   %    20 - 24  LV:        2.4  mm  Est. FW:    2853  gm      6 lb 5 oz     84  % ---------------------------------------------------------------------- OB History  Gravidity:    1         Term:   0        Prem:   0        SAB:   0  TOP:          0       Ectopic:  0        Living: 0 ---------------------------------------------------------------------- Gestational Age  LMP:           34w 5d        Date:  04/29/23                  EDD:   02/03/24  U/S Today:     36w 2d                                        EDD:   01/23/24  Best:          34w 5d     Det. By:  Early Ultrasound         EDD:   02/03/24                                      (07/02/23) ---------------------------------------------------------------------- Anatomy  Cranium:                Previously seen  Aortic Arch:            Previously seen  Cavum:                 Previously seen        Ductal Arch:            Previously seen  Ventricles:            Previously seen        Diaphragm:              Appears normal  Choroid Plexus:        Previously seen        Stomach:                Appears normal, left                                                                        sided  Cerebellum:            Previously seen        Abdomen:                Previously seen  Posterior Fossa:       Previously seen        Abdominal Wall:         Previously seen  Face:                  Orbits and profile     Cord Vessels:           Previously seen                         previously seen  Lips:                  Previously seen        Kidneys:                Appear normal  Thoracic:              Previously seen        Bladder:                Appears normal  Heart:                 Previously seen        Spine:                  Previously seen  RVOT:                  Previously seen        Upper Extremities:      Previously seen  LVOT:                  Previously seen        Lower Extremities:      Previously seen ---------------------------------------------------------------------- Impression  Fetal growth is appropriate for gestational age .Abdominal  circumference measurement is at the 94th percentile.  Amniotic fluid is normal and good fetal activity is seen  .Antenatal testing is reassuring. BPP 8/8.   Patient does not have gestational diabetes . ---------------------------------------------------------------------- Recommendations  -An  appointment was made for her to return in 4 weeks for  fetal growth assessment. ----------------------------------------------------------------------                 Fredia Fresh, MD Electronically Signed Final Report   12/28/2023 04:12 pm ----------------------------------------------------------------------   US  MFM FETAL BPP WO NON STRESS Result Date:  12/28/2023 ----------------------------------------------------------------------  OBSTETRICS REPORT                       (Signed Final 12/28/2023 04:12 pm) ---------------------------------------------------------------------- Patient Info  ID #:       [id number]                          D.O.B.:  May 05, 2001 (22 yrs)(F)  Name:       Catherine [name]                    Visit Date: 12/28/2023 12:43 pm ---------------------------------------------------------------------- Performed By  Attending:        Fredia Fresh MD        Ref. Address:     [address]                                                             [address]  Performed By:     Jonette Nap        Location[facility]                                  [facility]  Referred By:      [facility] ---------------------------------------------------------------------- Orders  #  Description                           Code        Ordered By  1  US  MFM OB FOLLOW UP                   76816.01    YU FANG  2  US  MFM FETAL BPP WO NON               23180.98    YU FANG     STRESS ----------------------------------------------------------------------  #  Order #  Accession #                Episode #  1  [id number]                   [id number]                 [id number]  2  [id number]                   [id number]                 [id number] ---------------------------------------------------------------------- Indications  Obesity complicating pregnancy, third          O99.213  trimester (pregravid BMI 46)  Encounter for other antenatal screening        Z36.2  follow-up  Low risk NIPS; GTT wnl  [redacted] weeks gestation of pregnancy                Z3A.34 ---------------------------------------------------------------------- Fetal Evaluation  Num Of Fetuses:         1  Fetal Heart  Rate(bpm):  130  Cardiac Activity:       Observed  Presentation:           Cephalic  Placenta:               Posterior  P. Cord Insertion:      Previously seen  Amniotic Fluid  AFI FV:      Within normal limits  AFI Sum(cm)     %Tile       Largest Pocket(cm)  13.56           46          5.39  RUQ(cm)       RLQ(cm)       LUQ(cm)        LLQ(cm)  4.2           5.39          0.92           3.05 ---------------------------------------------------------------------- Biophysical Evaluation  Amniotic F.V:   Pocket => 2 cm             F. Tone:        Observed  F. Movement:    Observed                   Score:          8/8  F. Breathing:   Observed ---------------------------------------------------------------------- Biometry  BPD:      93.1  mm     G. Age:  37w 6d       > 99  %    CI:        79.73   %    70 - 86                                                          FL/HC:      19.5   %    20.1 - 22.3  HC:      329.5  mm     G. Age:  37w 3d         82  %    HC/AC:      1.00        0.93 -  1.11  AC:      329.5  mm     G. Age:  36w 6d         96  %    FL/BPD:     69.0   %    71 - 87  FL:       64.2  mm     G. Age:  33w 1d          9  %    FL/AC:      19.5   %    20 - 24  LV:        2.4  mm  Est. FW:    2853  gm      6 lb 5 oz     84  % ---------------------------------------------------------------------- OB History  Gravidity:    1         Term:   0        Prem:   0        SAB:   0  TOP:          0       Ectopic:  0        Living: 0 ---------------------------------------------------------------------- Gestational Age  LMP:           34w 5d        Date:  04/29/23                  EDD:   02/03/24  U/S Today:     36w 2d                                        EDD:   01/23/24  Best:          34w 5d     Det. By:  Early Ultrasound         EDD:   02/03/24                                      (07/02/23) ---------------------------------------------------------------------- Anatomy  Cranium:               Previously seen        Aortic  Arch:            Previously seen  Cavum:                 Previously seen        Ductal Arch:            Previously seen  Ventricles:            Previously seen        Diaphragm:              Appears normal  Choroid Plexus:        Previously seen        Stomach:                Appears normal, left  sided  Cerebellum:            Previously seen        Abdomen:                Previously seen  Posterior Fossa:       Previously seen        Abdominal Wall:         Previously seen  Face:                  Orbits and profile     Cord Vessels:           Previously seen                         previously seen  Lips:                  Previously seen        Kidneys:                Appear normal  Thoracic:              Previously seen        Bladder:                Appears normal  Heart:                 Previously seen        Spine:                  Previously seen  RVOT:                  Previously seen        Upper Extremities:      Previously seen  LVOT:                  Previously seen        Lower Extremities:      Previously seen ---------------------------------------------------------------------- Impression  Fetal growth is appropriate for gestational age .Abdominal  circumference measurement is at the 94th percentile.  Amniotic fluid is normal and good fetal activity is seen  .Antenatal testing is reassuring. BPP 8/8.   Patient does not have gestational diabetes . ---------------------------------------------------------------------- Recommendations  -An appointment was made for her to return in 4 weeks for  fetal growth assessment. ----------------------------------------------------------------------                 Fredia Fresh, MD Electronically Signed Final Report   12/28/2023 04:12 pm ----------------------------------------------------------------------    Assessment and Plan:  Pregnancy: G1P0000 at [redacted]w[redacted]d 1. Maternal morbid obesity,  antepartum (HCC) (Primary) Continue weekly antenatal testing and serial growth scans. Plan for IOL as per protocol.  2. [redacted] weeks gestation of pregnancy 3. Supervision of high risk pregnancy in third trimester Pelvic cultures done, will follow up results and manage accordingly. - Strep Gp B Culture+Rflx - Cervicovaginal ancillary only Preterm labor symptoms and general obstetric precautions including but not limited to vaginal bleeding, contractions, leaking of fluid and fetal movement were reviewed in detail with the patient. Please refer to After Visit Summary for other counseling recommendations.   Return in about 1 week (around 01/18/2024) for OB visits and antenatal testing as scheduled.  Future Appointments  Date Time Provider Department Center  01/18/2024  1:10 PM [facility] NST [facility]  01/18/2024  1:30 PM Izell Harari, MD [facility]  01/25/2024  7:30 AM [facility] US3 [facility]  01/25/2024 10:35 AM Kayce Betty, Gloris LABOR, MD [facility]    [name], MD

## 2024-01-11 NOTE — Patient Instructions (Signed)

## 2024-01-11 NOTE — Progress Notes (Signed)

## 2024-01-14 ENCOUNTER — Other Ambulatory Visit: Payer: Self-pay

## 2024-01-14 ENCOUNTER — Encounter (HOSPITAL_COMMUNITY): Payer: Self-pay | Admitting: Obstetrics and Gynecology

## 2024-01-14 ENCOUNTER — Inpatient Hospital Stay (HOSPITAL_COMMUNITY)
Admission: AD | Admit: 2024-01-14 | Discharge: 2024-01-18 | DRG: 787 | Disposition: A | Discharge status: Home / Self Care | Payer: 59 | Attending: Obstetrics and Gynecology | Admitting: Obstetrics and Gynecology

## 2024-01-14 DIAGNOSIS — Z833 Family history of diabetes mellitus: Secondary | ICD-10-CM

## 2024-01-14 DIAGNOSIS — O99824 Streptococcus B carrier state complicating childbirth: Secondary | ICD-10-CM | POA: Diagnosis present

## 2024-01-14 DIAGNOSIS — O134 Gestational [pregnancy-induced] hypertension without significant proteinuria, complicating childbirth: Principal | ICD-10-CM | POA: Diagnosis present

## 2024-01-14 DIAGNOSIS — E66813 Obesity, class 3: Secondary | ICD-10-CM | POA: Diagnosis present

## 2024-01-14 DIAGNOSIS — Z3A00 Weeks of gestation of pregnancy not specified: Secondary | ICD-10-CM | POA: Diagnosis not present

## 2024-01-14 DIAGNOSIS — D62 Acute posthemorrhagic anemia: Secondary | ICD-10-CM | POA: Diagnosis not present

## 2024-01-14 DIAGNOSIS — O99214 Obesity complicating childbirth: Secondary | ICD-10-CM | POA: Diagnosis present

## 2024-01-14 DIAGNOSIS — Z3A37 37 weeks gestation of pregnancy: Secondary | ICD-10-CM

## 2024-01-14 DIAGNOSIS — Z880 Allergy status to penicillin: Secondary | ICD-10-CM | POA: Diagnosis not present

## 2024-01-14 DIAGNOSIS — Z7982 Long term (current) use of aspirin: Secondary | ICD-10-CM

## 2024-01-14 DIAGNOSIS — O99344 Other mental disorders complicating childbirth: Secondary | ICD-10-CM | POA: Diagnosis not present

## 2024-01-14 DIAGNOSIS — O139 Gestational [pregnancy-induced] hypertension without significant proteinuria, unspecified trimester: Principal | ICD-10-CM | POA: Diagnosis present

## 2024-01-14 DIAGNOSIS — Z2882 Immunization not carried out because of caregiver refusal: Secondary | ICD-10-CM | POA: Diagnosis not present

## 2024-01-14 DIAGNOSIS — O9962 Diseases of the digestive system complicating childbirth: Secondary | ICD-10-CM | POA: Diagnosis present

## 2024-01-14 DIAGNOSIS — Z8616 Personal history of COVID-19: Secondary | ICD-10-CM

## 2024-01-14 DIAGNOSIS — K219 Gastro-esophageal reflux disease without esophagitis: Secondary | ICD-10-CM | POA: Diagnosis present

## 2024-01-14 DIAGNOSIS — O9081 Anemia of the puerperium: Secondary | ICD-10-CM | POA: Diagnosis not present

## 2024-01-14 DIAGNOSIS — Z8249 Family history of ischemic heart disease and other diseases of the circulatory system: Secondary | ICD-10-CM | POA: Diagnosis not present

## 2024-01-14 DIAGNOSIS — Z051 Observation and evaluation of newborn for suspected infectious condition ruled out: Secondary | ICD-10-CM | POA: Diagnosis not present

## 2024-01-14 DIAGNOSIS — Z98891 History of uterine scar from previous surgery: Principal | ICD-10-CM

## 2024-01-14 HISTORY — DX: Gestational (pregnancy-induced) hypertension without significant proteinuria, unspecified trimester: O13.9

## 2024-01-14 LAB — COMPREHENSIVE METABOLIC PANEL
ALT: 14 U/L (ref 0–44)
AST: 15 U/L (ref 15–41)
Albumin: 2.3 g/dL — ABNORMAL LOW (ref 3.5–5.0)
Alkaline Phosphatase: 91 U/L (ref 38–126)
Anion gap: 9 (ref 5–15)
BUN: 5 mg/dL — ABNORMAL LOW (ref 6–20)
CO2: 20 mmol/L — ABNORMAL LOW (ref 22–32)
Calcium: 9 mg/dL (ref 8.9–10.3)
Chloride: 109 mmol/L (ref 98–111)
Creatinine, Ser: 0.54 mg/dL (ref 0.44–1.00)
GFR, Estimated: >60 mL/min (ref >60)
Glucose, Bld: 107 mg/dL — ABNORMAL HIGH (ref 70–99)
Potassium: 3.6 mmol/L (ref 3.5–5.1)
Sodium: 138 mmol/L (ref 135–145)
Total Bilirubin: 0.3 mg/dL (ref 0.0–1.2)
Total Protein: 5.9 g/dL — ABNORMAL LOW (ref 6.5–8.1)

## 2024-01-14 LAB — TYPE AND SCREEN
ABO/RH(D): O POS
Antibody Screen: NEGATIVE

## 2024-01-14 LAB — CBC
HCT: 37.7 % (ref 36.0–46.0)
Hemoglobin: 12.1 g/dL (ref 12.0–15.0)
MCH: 24.7 pg — ABNORMAL LOW (ref 26.0–34.0)
MCHC: 32.1 g/dL (ref 30.0–36.0)
MCV: 77.1 fL — ABNORMAL LOW (ref 80.0–100.0)
Platelets: 382 10*3/uL (ref 150–400)
RBC: 4.89 MIL/uL (ref 3.87–5.11)
RDW: 15.2 % (ref 11.5–15.5)
WBC: 13.4 10*3/uL — ABNORMAL HIGH (ref 4.0–10.5)
nRBC: 0 % (ref 0.0–0.2)

## 2024-01-14 LAB — GROUP B STREP BY PCR: Group B strep by PCR: POSITIVE — AB

## 2024-01-14 LAB — PROTEIN / CREATININE RATIO, URINE
Creatinine, Urine: 75 mg/dL
Protein Creatinine Ratio: 0.19 mg/mg{creat} — ABNORMAL HIGH (ref 0.00–0.15)
Total Protein, Urine: 14 mg/dL

## 2024-01-14 IMAGING — CR DG CHEST 2V
2 series · 2 of 2 positions shown · non-contrast
Comparison: None.

CLINICAL DATA: Chest pain and short of breath.

EXAM:
CHEST - 2 VIEW

[chest pa]
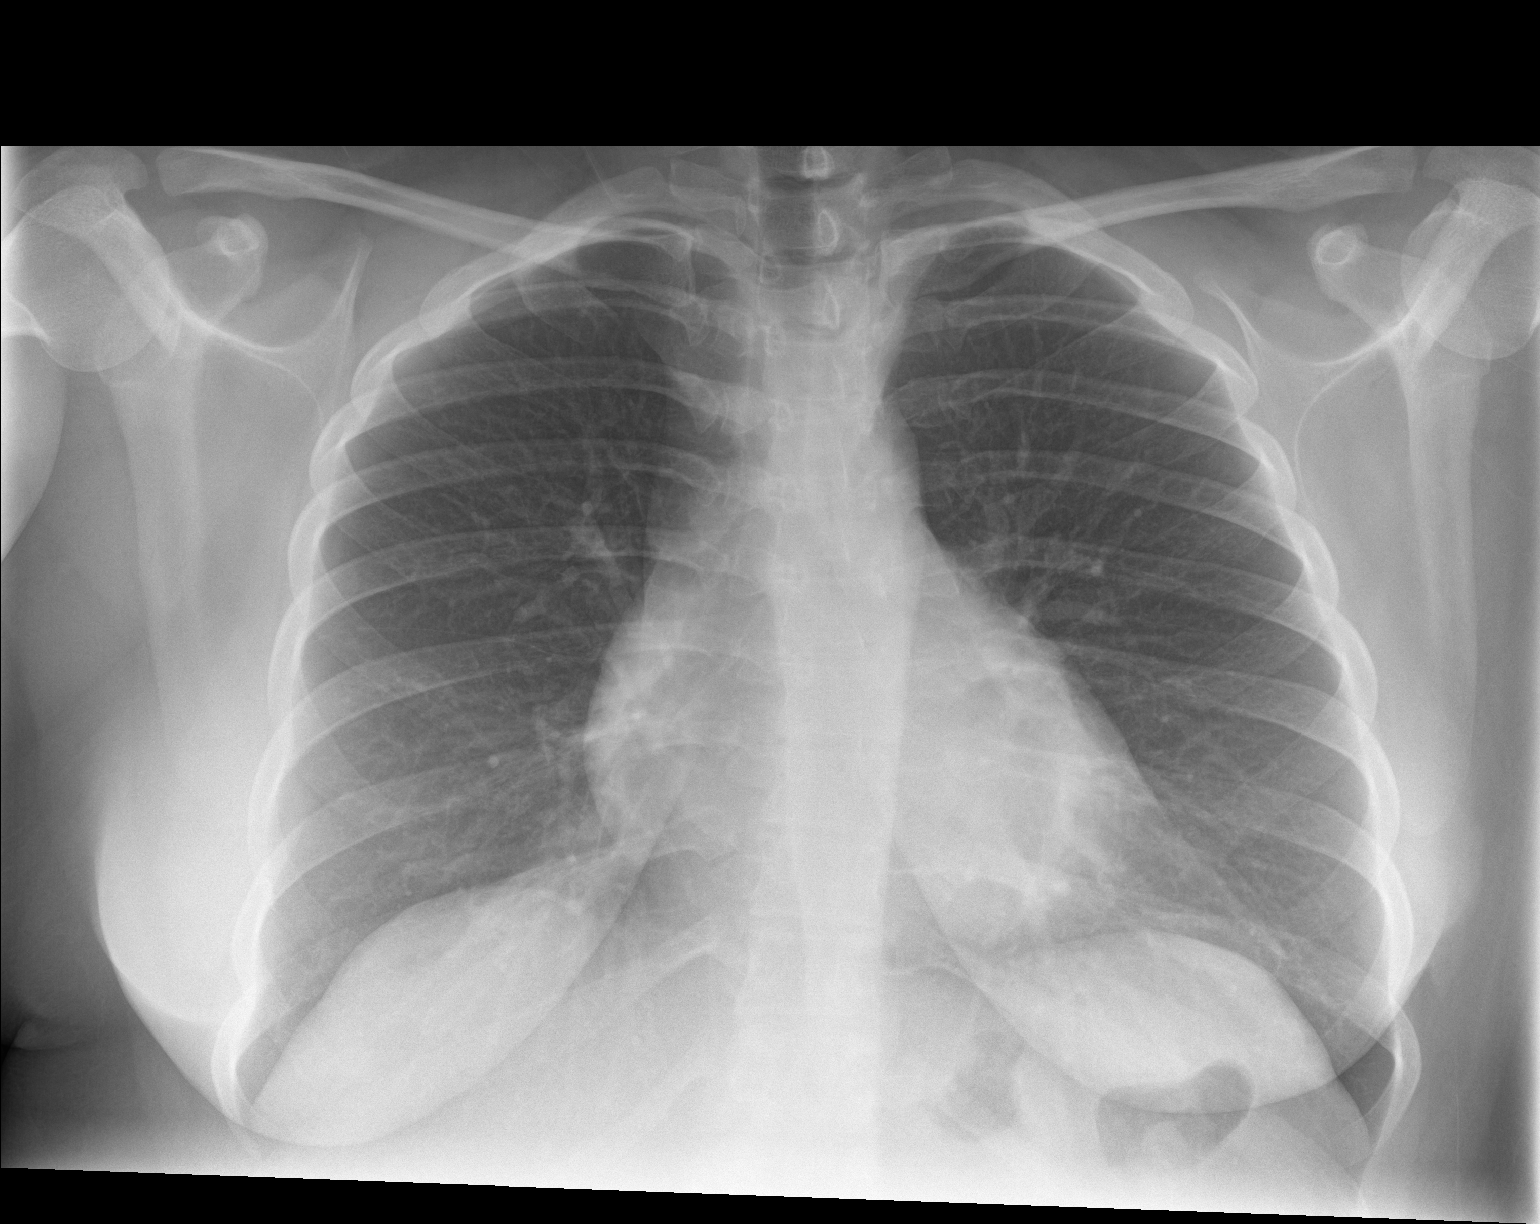

[chest lat]
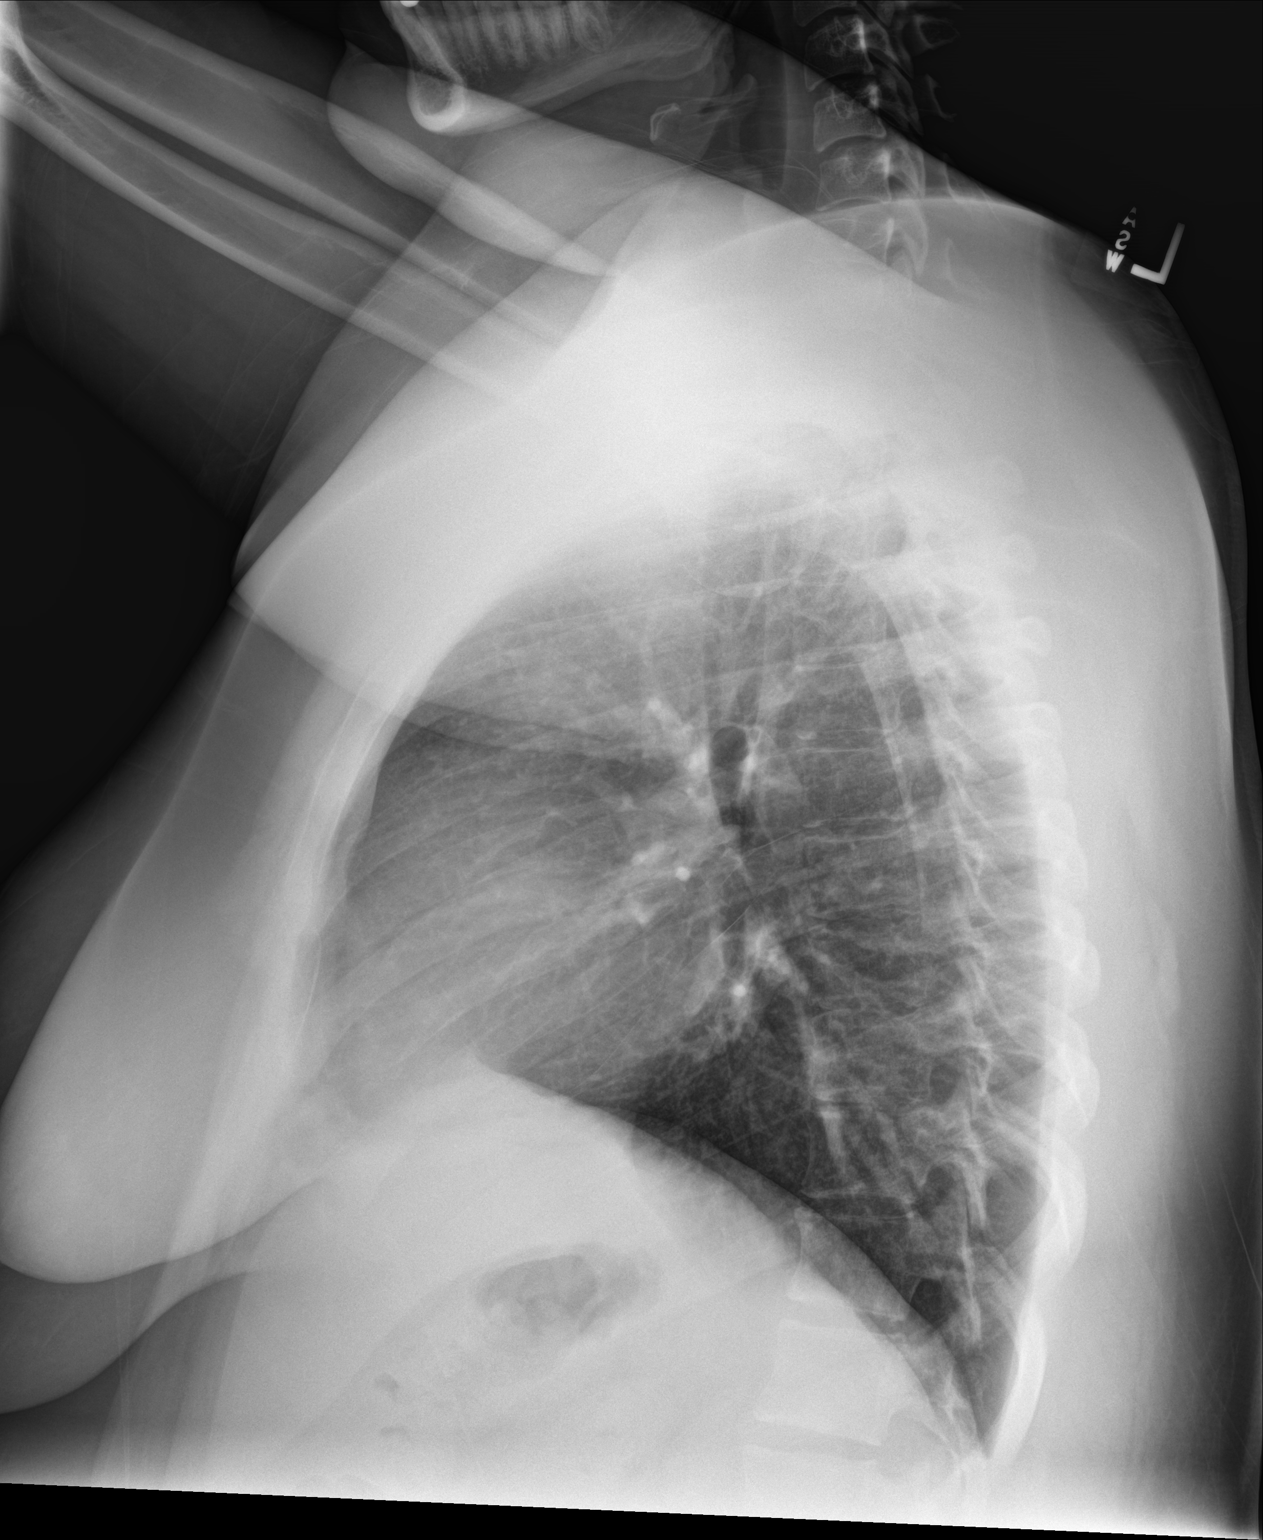

[2 of 2 positions shown; findings below may reference images not displayed]

FINDINGS: The heart size and mediastinal contours are within normal limits.
Both lungs are clear. The visualized skeletal structures are
unremarkable.
IMPRESSION: No active cardiopulmonary disease.

## 2024-01-14 MED ORDER — SOD CITRATE-CITRIC ACID 500-334 MG/5ML PO SOLN
30.0000 mL | ORAL | Status: DC | PRN
  Filled 2024-01-14: qty 30

## 2024-01-14 MED ORDER — LACTATED RINGERS IV SOLN: INTRAVENOUS | Status: AC

## 2024-01-14 MED ORDER — CEFAZOLIN SODIUM-DEXTROSE 1-4 GM/50ML-% IV SOLN
1.0000 g | Freq: Three times a day (TID) | INTRAVENOUS | Status: DC
  Administered 2024-01-15 – 2024-01-16 (×5): 1 g via INTRAVENOUS
  Filled 2024-01-14 (×5): qty 50

## 2024-01-14 MED ORDER — NIFEDIPINE ER OSMOTIC RELEASE 30 MG PO TB24
30.0000 mg | ORAL_TABLET | Freq: Every day | ORAL | Status: DC
  Administered 2024-01-14 – 2024-01-18 (×5): 30 mg via ORAL
  Filled 2024-01-14 (×5): qty 1

## 2024-01-14 MED ORDER — CEFAZOLIN SODIUM-DEXTROSE 2-4 GM/100ML-% IV SOLN
2.0000 g | Freq: Once | INTRAVENOUS | Status: AC
  Administered 2024-01-14: 2 g via INTRAVENOUS
  Filled 2024-01-14: qty 100

## 2024-01-14 MED ORDER — MISOPROSTOL 50MCG HALF TABLET
50.0000 ug | ORAL_TABLET | Freq: Once | ORAL | Status: AC
  Administered 2024-01-14: 50 ug via ORAL
  Filled 2024-01-14: qty 1

## 2024-01-14 MED ORDER — ONDANSETRON HCL 4 MG/2ML IJ SOLN
4.0000 mg | Freq: Four times a day (QID) | INTRAMUSCULAR | Status: DC | PRN
  Administered 2024-01-15: 4 mg via INTRAVENOUS
  Filled 2024-01-14: qty 2

## 2024-01-14 MED ORDER — LACTATED RINGERS IV SOLN: 500.0000 mL | INTRAVENOUS | Status: AC | PRN

## 2024-01-14 MED ORDER — OXYTOCIN-SODIUM CHLORIDE 30-0.9 UT/500ML-% IV SOLN
2.5000 [IU]/h | INTRAVENOUS | Status: DC
  Filled 2024-01-14: qty 500

## 2024-01-14 MED ORDER — TERBUTALINE SULFATE 1 MG/ML IJ SOLN: 0.2500 mg | Freq: Once | INTRAMUSCULAR | Status: DC | PRN

## 2024-01-14 MED ORDER — ACETAMINOPHEN 500 MG PO TABS
1000.0000 mg | ORAL_TABLET | Freq: Once | ORAL | Status: AC
  Administered 2024-01-14: 1000 mg via ORAL
  Filled 2024-01-14: qty 2

## 2024-01-14 MED ORDER — OXYCODONE-ACETAMINOPHEN 5-325 MG PO TABS: 2.0000 | ORAL_TABLET | ORAL | Status: DC | PRN

## 2024-01-14 MED ORDER — MISOPROSTOL 25 MCG QUARTER TABLET
25.0000 ug | ORAL_TABLET | Freq: Once | ORAL | Status: AC
  Administered 2024-01-14: 25 ug via VAGINAL
  Filled 2024-01-14: qty 1

## 2024-01-14 MED ORDER — OXYCODONE-ACETAMINOPHEN 5-325 MG PO TABS: 1.0000 | ORAL_TABLET | ORAL | Status: DC | PRN

## 2024-01-14 MED ORDER — ACETAMINOPHEN 325 MG PO TABS
650.0000 mg | ORAL_TABLET | ORAL | Status: DC | PRN
  Administered 2024-01-16: 650 mg via ORAL
  Filled 2024-01-14: qty 2

## 2024-01-14 MED ORDER — LIDOCAINE HCL (PF) 1 % IJ SOLN
30.0000 mL | INTRAMUSCULAR | Status: DC | PRN
  Filled 2024-01-14: qty 30

## 2024-01-14 MED ORDER — OXYTOCIN BOLUS FROM INFUSION: 333.0000 mL | Freq: Once | INTRAVENOUS | Status: DC

## 2024-01-14 MED ORDER — FENTANYL CITRATE (PF) 100 MCG/2ML IJ SOLN
50.0000 ug | INTRAMUSCULAR | Status: DC | PRN
Start: 2024-01-14 — End: 2024-01-17
  Administered 2024-01-15: 50 ug via INTRAVENOUS
  Administered 2024-01-15 (×3): 100 ug via INTRAVENOUS
  Filled 2024-01-14 (×5): qty 2

## 2024-01-14 NOTE — MAU Provider Note (Signed)
 MAU Provider Note  Chief Complaint: Hypertension and Headache   Event Date/Time   First Provider Initiated Contact with Patient 01/14/24 1826      SUBJECTIVE HPI: Catherine [name] is a 23 y.o. G1P0000 at [redacted]w[redacted]d by LMP who presents to maternity admissions reporting HBP. Pregnancy c/b obesity. Receives [facility] with [place].  Patient presents from home with HBP 156/108. Was feeling lightheaded/dizzy and decided to check BP. Notes ongoing headache with intermittent spots in vision. Notes lower extremity edema. +SOB. Denies RUQ pain, VB, LOF, contractions, DFM.   HPI  Past Medical History:  Diagnosis Date   Allergic rhinitis 04/01/2010   Qualifier: Diagnosis of   By: Mario MD, Katheryn         Allergy    Anxiety    Asthma    past hx   Blanching rash 10/19/2022   Childhood asthma, mild intermittent, uncomplicated 02/01/2007   Qualifier: Diagnosis of   By: Bari MD, Theodoro         Depression    Family history of brain aneurysm- father died around age 41  16-Apr-2020   Health care maintenance Apr 16, 2020   Care Gaps     High risk heterosexual behavior 16-Apr-2020   History of anemia 04/16/20   Menorrhagia with irregular cycle 04-16-2020   Migraine    Pelvic pain Apr 16, 2020   Polyuria 2020-04-16   Right lower quadrant pain 04/16/2020   Past Surgical History:  Procedure Laterality Date   TONSILLECTOMY Bilateral 08/11/2015   Procedure: CONTROL POST TONSILLECTOMY BLEEDING ;  Surgeon: Deward Argue, MD;  Location: [facility];  Service: ENT;  Laterality: Bilateral;   TONSILLECTOMY AND ADENOIDECTOMY N/A 07/31/2015   Procedure: TONSILLECTOMY AND ADENOIDECTOMY;  Surgeon: Chinita Hasten, MD;  Location: [facility];  Service: ENT;  Laterality: N/A;   WISDOM TOOTH EXTRACTION     Social History   Socioeconomic History   Marital status: Significant Other    Spouse name: Coletta   Number of children: Not on file   Years of education: Not on file   Highest education level: Not on file   Occupational History   Not on file  Tobacco Use   Smoking status: Never    Passive exposure: Never   Smokeless tobacco: Never  Vaping Use   Vaping status: Never Used  Substance and Sexual Activity   Alcohol use: Not Currently    Comment: once per month, 1-2 drinks   Drug use: Not Currently    Comment: last use feb 2024   Sexual activity: Yes    Birth control/protection: None  Other Topics Concern   Not on file  Social History Narrative   Not on file   Social Drivers of Health   Financial Resource Strain: Not on file  Food Insecurity: No Food Insecurity (01/18/2023)   Hunger Vital Sign    Worried About Running Out of Food in the Last Year: Never true    Ran Out of Food in the Last Year: Never true  Transportation Needs: No Transportation Needs (01/18/2023)   PRAPARE - Administrator, Civil Service (Medical): No    Lack of Transportation (Non-Medical): No  Physical Activity: Not on file  Stress: Not on file  Social Connections: Moderately Integrated (01/18/2023)   Social Connection and Isolation Panel [NHANES]    Frequency of Communication with Friends and Family: More than three times a week    Frequency of Social Gatherings with Friends and Family: More than three times a week  Attends Religious Services: More than 4 times per year    Active Member of Clubs or Organizations: No    Attends Banker Meetings: Never    Marital Status: Living with partner  Intimate Partner Violence: Not At Risk (01/18/2023)   Humiliation, Afraid, Rape, and Kick questionnaire    Fear of Current or Ex-Partner: No    Emotionally Abused: No    Physically Abused: No    Sexually Abused: No   No current facility-administered medications on file prior to encounter.   Current Outpatient Medications on File Prior to Encounter  Medication Sig Dispense Refill   aspirin EC 81 MG tablet Take 81 mg by mouth daily. Swallow whole.     prenatal vitamin w/FE, FA (PRENATAL 1 + 1)  27-1 MG TABS tablet Take 1 tablet by mouth daily at 12 noon.     chlorpheniramine-HYDROcodone  (TUSSIONEX) 10-8 MG/5ML Take 5 mLs by mouth 2 (two) times daily as needed for cough. 70 mL 0   NIFEdipine  (PROCARDIA ) 10 MG capsule Take 1 capsule (10 mg total) by mouth 3 (three) times daily as needed (more than 6 contractions per hour). (Patient not taking: Reported on 12/26/2023) 90 capsule 0   Allergies  Allergen Reactions   Amoxicillin  Hives    ROS:  Pertinent positives/negatives listed above.  I have reviewed patient's Past Medical Hx, Surgical Hx, Family Hx, Social Hx, medications and allergies.   Physical Exam  Patient Vitals for the past 24 hrs:  BP Temp Pulse Resp Height Weight  01/14/24 2001 (!) 142/77 -- (!) 102 -- -- --  01/14/24 1946 (!) 141/78 -- (!) 104 -- -- --  01/14/24 1931 138/77 -- 97 -- -- --  01/14/24 1916 138/73 -- 97 -- -- --  01/14/24 1901 (!) 140/81 -- 92 -- -- --  01/14/24 1846 139/81 -- 96 -- -- --  01/14/24 1831 (!) 150/88 -- 94 -- -- --  01/14/24 1822 (!) 147/86 -- (!) 103 -- -- --  01/14/24 1821 (!) 147/86 98.2 F (36.8 C) (!) 108 18 5' 3 (1.6 m) 126.9 kg   Constitutional: Well-developed, well-nourished female in no acute distress  Cardiovascular: normal rate Respiratory: normal effort GI: Abd soft, non-tender MS: Extremities nontender, no edema, normal ROM Neurologic: Alert and oriented x 4  GU: Neg CVAT  FHT:  Baseline 125, moderate variability, accelerations present, no decelerations Contractions: none  LAB RESULTS Results for orders placed or performed during the hospital encounter of 01/14/24 (from the past 24 hours)  CBC     Status: Abnormal   Collection Time: 01/14/24  6:25 PM  Result Value Ref Range   WBC 13.4 (H) 4.0 - 10.5 K/uL   RBC 4.89 3.87 - 5.11 MIL/uL   Hemoglobin 12.1 12.0 - 15.0 g/dL   HCT 62.2 63.9 - 53.9 %   MCV 77.1 (L) 80.0 - 100.0 fL   MCH 24.7 (L) 26.0 - 34.0 pg   MCHC 32.1 30.0 - 36.0 g/dL   RDW 84.7 88.4 - 84.4 %    Platelets 382 150 - 400 K/uL   nRBC 0.0 0.0 - 0.2 %  Comprehensive metabolic panel     Status: Abnormal   Collection Time: 01/14/24  6:25 PM  Result Value Ref Range   Sodium 138 135 - 145 mmol/L   Potassium 3.6 3.5 - 5.1 mmol/L   Chloride 109 98 - 111 mmol/L   CO2 20 (L) 22 - 32 mmol/L   Glucose, Bld 107 (H) 70 - 99  mg/dL   BUN 5 (L) 6 - 20 mg/dL   Creatinine, Ser 9.45 0.44 - 1.00 mg/dL   Calcium  9.0 8.9 - 10.3 mg/dL   Total Protein 5.9 (L) 6.5 - 8.1 g/dL   Albumin 2.3 (L) 3.5 - 5.0 g/dL   AST 15 15 - 41 U/L   ALT 14 0 - 44 U/L   Alkaline Phosphatase 91 38 - 126 U/L   Total Bilirubin 0.3 0.0 - 1.2 mg/dL   GFR, Estimated >39 >39 mL/min   Anion gap 9 5 - 15  Protein / creatinine ratio, urine     Status: Abnormal   Collection Time: 01/14/24  6:25 PM  Result Value Ref Range   Creatinine, Urine 75 mg/dL   Total Protein, Urine 14 mg/dL   Protein Creatinine Ratio 0.19 (H) 0.00 - 0.15 mg/mg[Cre]    --/--/O POS (07/17 1844)  IMAGING US  FETAL BPP W/NONSTRESS Result Date: 01/11/2024 ----------------------------------------------------------------------  OBSTETRICS REPORT                       (Signed Final 01/11/2024 03:50 pm) ---------------------------------------------------------------------- Patient Info  ID #:       [id number]                          D.O.B.:  2001-06-08 (22 yrs)(F)  Name:       Catherine [name]                    Visit Date: 01/11/2024 02:43 pm ---------------------------------------------------------------------- Performed By  Attending:        [name]         Ref. Address:     [address]                    [address]                                                             [address]  Performed By:     Wanda Buckles RN     [facility]  Referred By:       [facility]  [facility] ---------------------------------------------------------------------- Orders  #  Description                           Code        Ordered By  1  US  FETAL BPP W/NONSTRESS              23181.5     [name] ----------------------------------------------------------------------  #  Order #                     Accession #                Episode #  1  [id number]                   [id number]                 [id number] ---------------------------------------------------------------------- Indications  [redacted] weeks gestation of pregnancy                Z3A.36  Obesity complicating pregnancy                 O99.210 E66.9 ---------------------------------------------------------------------- Fetal Evaluation  Num Of Fetuses:         1  Cardiac Activity:       Observed  Presentation:           Cephalic  Amniotic Fluid  AFI FV:      Within normal limits  AFI Sum(cm)     %Tile       Largest Pocket(cm)  8.53            12          4.1  RUQ(cm)       RLQ(cm)       LUQ(cm)        LLQ(cm)  1.46          0.88          4.1            2.09 ---------------------------------------------------------------------- Biophysical Evaluation  Amniotic F.V:   Within normal limits       F. Tone:        Observed  F. Movement:    Observed                   N.S.T:          Reactive  F. Breathing:   Observed                   Score:          10/10 ---------------------------------------------------------------------- OB History  Gravidity:    1         Term:   0        Prem:   0        SAB:   0  TOP:          0       Ectopic:  0        Living: 0 ---------------------------------------------------------------------- Gestational Age  LMP:           36w 5d        Date:  04/29/23                  EDD:   02/03/24  Best:          36w 5d     Det. By:  Early Ultrasound  EDD:   02/03/24                                      (07/02/23) ---------------------------------------------------------------------- Impression  Antenatal testing is  reassuring with BPP 10/10.  Normal amniotic volume.  Cephalic presentation. ---------------------------------------------------------------------- Recommendations  Continue weekly antenatal testing till delivery . ----------------------------------------------------------------------              [name], MD Electronically Signed Final Report   01/11/2024 03:50 pm ----------------------------------------------------------------------   US  MFM OB FOLLOW UP Result Date: 12/28/2023 ----------------------------------------------------------------------  OBSTETRICS REPORT                       (Signed Final 12/28/2023 04:12 pm) ---------------------------------------------------------------------- Patient Info  ID #:       [id number]                          D.O.B.:  March 23, 2001 (22 yrs)(F)  Name:       Catherine [name]                    Visit Date: 12/28/2023 12:43 pm ---------------------------------------------------------------------- Performed By  Attending:        Fredia Fresh MD        Ref. Address:     [address]                                                             [address]  Performed By:     Jonette Nap        Location[facility]                                  [facility]  Referred By:      [facility] ---------------------------------------------------------------------- Orders  #  Description                           Code        Ordered By  1  US  MFM OB FOLLOW UP                   [id number]    YU  FANG  2  US  MFM FETAL BPP WO NON               76819.01    YU FANG     STRESS ----------------------------------------------------------------------  #  Order #                     Accession #                Episode #  1  [id number]                   [id number]                 [id number]  2  [id number]                    [id number]                 [id number] ---------------------------------------------------------------------- Indications  Obesity complicating pregnancy, third          O99.213  trimester (pregravid BMI 46)  Encounter for other antenatal screening        Z36.2  follow-up  Low risk NIPS; GTT wnl  [redacted] weeks gestation of pregnancy                Z3A.34 ---------------------------------------------------------------------- Fetal Evaluation  Num Of Fetuses:         1  Fetal Heart Rate(bpm):  130  Cardiac Activity:       Observed  Presentation:           Cephalic  Placenta:               Posterior  P. Cord Insertion:      Previously seen  Amniotic Fluid  AFI FV:      Within normal limits  AFI Sum(cm)     %Tile       Largest Pocket(cm)  13.56           46          5.39  RUQ(cm)       RLQ(cm)       LUQ(cm)        LLQ(cm)  4.2           5.39          0.92           3.05 ---------------------------------------------------------------------- Biophysical Evaluation  Amniotic F.V:   Pocket => 2 cm             F. Tone:        Observed  F. Movement:    Observed                   Score:          8/8  F. Breathing:   Observed ---------------------------------------------------------------------- Biometry  BPD:      93.1  mm     G. Age:  37w 6d       > 99  %    CI:        79.73   %    70 - 86                                                          FL/HC:  19.5   %    20.1 - 22.3  HC:      329.5  mm     G. Age:  37w 3d         82  %    HC/AC:      1.00        0.93 - 1.11  AC:      329.5  mm     G. Age:  36w 6d         96  %    FL/BPD:     69.0   %    71 - 87  FL:       64.2  mm     G. Age:  33w 1d          9  %    FL/AC:      19.5   %    20 - 24  LV:        2.4  mm  Est. FW:    2853  gm      6 lb 5 oz     84  % ---------------------------------------------------------------------- OB History  Gravidity:    1         Term:   0        Prem:   0        SAB:   0  TOP:          0       Ectopic:  0        Living: 0  ---------------------------------------------------------------------- Gestational Age  LMP:           34w 5d        Date:  04/29/23                  EDD:   02/03/24  U/S Today:     36w 2d                                        EDD:   01/23/24  Best:          34w 5d     Det. By:  Early Ultrasound         EDD:   02/03/24                                      (07/02/23) ---------------------------------------------------------------------- Anatomy  Cranium:               Previously seen        Aortic Arch:            Previously seen  Cavum:                 Previously seen        Ductal Arch:            Previously seen  Ventricles:            Previously seen        Diaphragm:              Appears normal  Choroid Plexus:        Previously seen        Stomach:                Appears  normal, left                                                                        sided  Cerebellum:            Previously seen        Abdomen:                Previously seen  Posterior Fossa:       Previously seen        Abdominal Wall:         Previously seen  Face:                  Orbits and profile     Cord Vessels:           Previously seen                         previously seen  Lips:                  Previously seen        Kidneys:                Appear normal  Thoracic:              Previously seen        Bladder:                Appears normal  Heart:                 Previously seen        Spine:                  Previously seen  RVOT:                  Previously seen        Upper Extremities:      Previously seen  LVOT:                  Previously seen        Lower Extremities:      Previously seen ---------------------------------------------------------------------- Impression  Fetal growth is appropriate for gestational age .Abdominal  circumference measurement is at the 94th percentile.  Amniotic fluid is normal and good fetal activity is seen  .Antenatal testing is reassuring. BPP 8/8.   Patient does not have gestational diabetes .  ---------------------------------------------------------------------- Recommendations  -An appointment was made for her to return in 4 weeks for  fetal growth assessment. ----------------------------------------------------------------------                 Fredia Fresh, MD Electronically Signed Final Report   12/28/2023 04:12 pm ----------------------------------------------------------------------   US  MFM FETAL BPP WO NON STRESS Result Date: 12/28/2023 ----------------------------------------------------------------------  OBSTETRICS REPORT                       (Signed Final 12/28/2023 04:12 pm) ---------------------------------------------------------------------- Patient Info  ID #:       [id number]                          D.O.B.:  [birth year] (22  yrs)(F)  Name:       Catherine [name]                    Visit Date: 12/28/2023 12:43 pm ---------------------------------------------------------------------- Performed By  Attending:        Fredia Fresh MD        Ref. Address:     [address]                                                             [address]  Performed By:     Jonette Nap        [facility]                                  [facility]  Referred By:      [facility] ---------------------------------------------------------------------- Orders  #  Description                           Code        Ordered By  1  US  MFM OB FOLLOW UP                   [id number]    YU FANG  2  US  MFM FETAL BPP WO NON               76819.01    YU FANG     STRESS ----------------------------------------------------------------------  #  Order #                     Accession #                Episode #  1  [id number]                   [id number]                 [id number]  2  [id number]                   [id number]                  [id number] ---------------------------------------------------------------------- Indications  Obesity complicating pregnancy, third          O99.213  trimester (  pregravid BMI 46)  Encounter for other antenatal screening        Z36.2  follow-up  Low risk NIPS; GTT wnl  [redacted] weeks gestation of pregnancy                Z3A.34 ---------------------------------------------------------------------- Fetal Evaluation  Num Of Fetuses:         1  Fetal Heart Rate(bpm):  130  Cardiac Activity:       Observed  Presentation:           Cephalic  Placenta:               Posterior  P. Cord Insertion:      Previously seen  Amniotic Fluid  AFI FV:      Within normal limits  AFI Sum(cm)     %Tile       Largest Pocket(cm)  13.56           46          5.39  RUQ(cm)       RLQ(cm)       LUQ(cm)        LLQ(cm)  4.2           5.39          0.92           3.05 ---------------------------------------------------------------------- Biophysical Evaluation  Amniotic F.V:   Pocket => 2 cm             F. Tone:        Observed  F. Movement:    Observed                   Score:          8/8  F. Breathing:   Observed ---------------------------------------------------------------------- Biometry  BPD:      93.1  mm     G. Age:  37w 6d       > 99  %    CI:        79.73   %    70 - 86                                                          FL/HC:      19.5   %    20.1 - 22.3  HC:      329.5  mm     G. Age:  37w 3d         82  %    HC/AC:      1.00        0.93 - 1.11  AC:      329.5  mm     G. Age:  36w 6d         96  %    FL/BPD:     69.0   %    71 - 87  FL:       64.2  mm     G. Age:  33w 1d          9  %    FL/AC:      19.5   %    20 - 24  LV:        2.4  mm  Est. FW:    2853  gm      6 lb 5 oz     84  % ---------------------------------------------------------------------- OB History  Gravidity:    1         Term:   0        Prem:   0        SAB:   0  TOP:          0       Ectopic:  0        Living: 0  ---------------------------------------------------------------------- Gestational Age  LMP:           34w 5d        Date:  04/29/23                  EDD:   02/03/24  U/S Today:     36w 2d                                        EDD:   01/23/24  Best:          34w 5d     Det. By:  Early Ultrasound         EDD:   02/03/24                                      (07/02/23) ---------------------------------------------------------------------- Anatomy  Cranium:               Previously seen        Aortic Arch:            Previously seen  Cavum:                 Previously seen        Ductal Arch:            Previously seen  Ventricles:            Previously seen        Diaphragm:              Appears normal  Choroid Plexus:        Previously seen        Stomach:                Appears normal, left                                                                        sided  Cerebellum:            Previously seen        Abdomen:                Previously seen  Posterior Fossa:       Previously seen        Abdominal Wall:         Previously seen  Face:                  Orbits and profile     Cord Vessels:  Previously seen                         previously seen  Lips:                  Previously seen        Kidneys:                Appear normal  Thoracic:              Previously seen        Bladder:                Appears normal  Heart:                 Previously seen        Spine:                  Previously seen  RVOT:                  Previously seen        Upper Extremities:      Previously seen  LVOT:                  Previously seen        Lower Extremities:      Previously seen ---------------------------------------------------------------------- Impression  Fetal growth is appropriate for gestational age .Abdominal  circumference measurement is at the 94th percentile.  Amniotic fluid is normal and good fetal activity is seen  .Antenatal testing is reassuring. BPP 8/8.   Patient does not have gestational diabetes .  ---------------------------------------------------------------------- Recommendations  -An appointment was made for her to return in 4 weeks for  fetal growth assessment. ----------------------------------------------------------------------                 Fredia Fresh, MD Electronically Signed Final Report   12/28/2023 04:12 pm ----------------------------------------------------------------------    MAU Management/MDM: Orders Placed This Encounter  Procedures   CBC   Comprehensive metabolic panel   Protein / creatinine ratio, urine    Meds ordered this encounter  Medications   acetaminophen  (TYLENOL ) tablet 1,000 mg     Available prenatal records reviewed.  Patient presents for elevated BP at [redacted]w[redacted]d with headache and vision changes. She has a history of labile pressures during pregnancy, however now meets criteria for gHTN. PreE labs obtained; tylenol  given for headache.  Labs pending upon transfer of care to Dr. Von at [id number] PM.  Almarie Moats, MD OB Fellow 01/14/2024  8:14 PM

## 2024-01-14 NOTE — MAU Note (Signed)
.  Catherine [name] is a 23 y.o. at [redacted]w[redacted]d here in MAU reporting: Has had a headache off and on for a few days has taken tylenol  without much relief. Stated today she was in the BR and started feeling fuzzy headed and head and chest started tohurt. Took B/P  and it was 156/108.  Stated she is having some blurred vision as well. Has not taken any tylenol  today. Good fetal movement reported no abd pain.   LMP:  Onset of complaint: this afternoon Pain score: 8 Vitals:   01/14/24 1821  BP: (!) 147/86  Pulse: (!) 108  Resp: 18  Temp: 98.2 F (36.8 C)     FHT: 145  Lab orders placed from triage: [facility] labs

## 2024-01-14 NOTE — Progress Notes (Signed)
 Labor Progress Note  22yo G1 at [redacted]w[redacted]d a/f IOL for gHTN.   In to see patient at 11:30p  Comfortable. Feels irregular cramping. Visual changes resolved. BP normotensive to mild range on procardia  XL 30mg    S/p dual cytotec  placement by RN Vertex by bedside US  Closed/long/high FHT 140, moderate, +accels, no decels Toco irritable  Attempted FB placement, unsuccessful. Pt tolerated well. Will re-eval at 3a and place FB if indicated. Continue IOL   Kieth Carolin, MD Obstetrician & Gynecologist, [facility], [facility]

## 2024-01-14 NOTE — H&P (Signed)
 OBSTETRIC ADMISSION HISTORY AND PHYSICAL  Catherine [name] is a 23 y.o. female G1P0000 with IUP at [redacted]w[redacted]d (dated by L/9, Estimated Date of Delivery: 02/03/24) presenting for IOL due to gHTN. Pt presented to [facility] due to elevated BP and onset of headache. Headache treated w Tylenol  w relief, however, pt with vision changes that are persistent and acute in onset despite improvement of headache. Describes vision changes as spots in vision and blurred vision.   She reports +FMs, No LOF, no VB, or peripheral edema, and RUQ pain.    She plans on breast and formula feeding. She request POPs for birth control.  She received her [facility]   Prenatal History/Complications:  - New onset gHTN - Migraine - GAD - BMI 49  Past Medical History: Past Medical History:  Diagnosis Date   Allergic rhinitis 04/01/2010   Qualifier: Diagnosis of   By: Mario MD, Lori         Allergy    Anxiety    Asthma    past hx   Blanching rash 10/19/2022   Childhood asthma, mild intermittent, uncomplicated 02/01/2007   Qualifier: Diagnosis of   By: Bari MD, Theodoro         Depression    Family history of brain aneurysm- father died around age 57  2020/04/12   Health care maintenance 2020/04/12   Care Gaps     High risk heterosexual behavior 04/12/2020   History of anemia 04/12/20   Menorrhagia with irregular cycle 04/12/2020   Migraine    Pelvic pain 04-12-2020   Polyuria Apr 12, 2020   Right lower quadrant pain 12-Apr-2020    Past Surgical History: Past Surgical History:  Procedure Laterality Date   TONSILLECTOMY Bilateral 08/11/2015   Procedure: CONTROL POST TONSILLECTOMY BLEEDING ;  Surgeon: Deward Argue, MD;  Location: [facility] ORS;  Service: ENT;  Laterality: Bilateral;   TONSILLECTOMY AND ADENOIDECTOMY N/A 07/31/2015   Procedure: TONSILLECTOMY AND ADENOIDECTOMY;  Surgeon: Chinita Hasten, MD;  Location: [facility];  Service: ENT;  Laterality: N/A;   WISDOM TOOTH EXTRACTION       Obstetrical History: OB History     Gravida  1   Para  0   Term  0   Preterm  0   AB  0   Living  0      SAB  0   IAB  0   Ectopic  0   Multiple  0   Live Births  0           Social History Social History   Socioeconomic History   Marital status: Significant Other    Spouse name: Coletta   Number of children: Not on file   Years of education: Not on file   Highest education level: Not on file  Occupational History   Not on file  Tobacco Use   Smoking status: Never    Passive exposure: Never   Smokeless tobacco: Never  Vaping Use   Vaping status: Never Used  Substance and Sexual Activity   Alcohol use: Not Currently    Comment: once per month, 1-2 drinks   Drug use: Not Currently    Comment: last use feb 2024   Sexual activity: Yes    Birth control/protection: None  Other Topics Concern   Not on file  Social History Narrative   Not on file   Social Drivers of Health   Financial Resource Strain: Not on file  Food Insecurity: No Food Insecurity (01/18/2023)  Hunger Vital Sign    Worried About Running Out of Food in the Last Year: Never true    Ran Out of Food in the Last Year: Never true  Transportation Needs: No Transportation Needs (01/18/2023)   PRAPARE - Administrator, Civil Service (Medical): No    Lack of Transportation (Non-Medical): No  Physical Activity: Not on file  Stress: Not on file  Social Connections: Moderately Integrated (01/18/2023)   Social Connection and Isolation Panel [NHANES]    Frequency of Communication with Friends and Family: More than three times a week    Frequency of Social Gatherings with Friends and Family: More than three times a week    Attends Religious Services: More than 4 times per year    Active Member of [employer] or Organizations: No    Attends Engineer, Structural: Never    Marital Status: Living with partner    Family History: Family History  Problem Relation Age of Onset    Heart disease Mother    Bipolar disorder Mother    Depression Mother    Cancer Mother    Schizophrenia Mother    Asthma Father    Hypertension Father    Aneurysm Father    Hypertension Brother    Diabetes Brother    Breast cancer Maternal Grandmother    Cancer Maternal Grandmother    Lung disease Maternal Grandmother    Heart disease Maternal Grandfather    Heart attack Maternal Grandfather    Aneurysm Paternal Grandfather     Allergies: Allergies  Allergen Reactions   Amoxicillin  Hives    Medications Prior to Admission  Medication Sig Dispense Refill Last Dose/Taking   aspirin EC 81 MG tablet Take 81 mg by mouth daily. Swallow whole.   01/13/2024   prenatal vitamin w/FE, FA (PRENATAL 1 + 1) 27-1 MG TABS tablet Take 1 tablet by mouth daily at 12 noon.   01/13/2024   chlorpheniramine-HYDROcodone  (TUSSIONEX) 10-8 MG/5ML Take 5 mLs by mouth 2 (two) times daily as needed for cough. 70 mL 0    NIFEdipine  (PROCARDIA ) 10 MG capsule Take 1 capsule (10 mg total) by mouth 3 (three) times daily as needed (more than 6 contractions per hour). (Patient not taking: Reported on 12/26/2023) 90 capsule 0      Review of Systems  All systems reviewed and negative except as stated in HPI.  Blood pressure (!) 142/77, pulse (!) 102, temperature 98.2 F (36.8 C), resp. rate 18, height 5' 3 (1.6 m), weight 126.9 kg, last menstrual period 04/29/2023. General appearance: alert and cooperative Lungs: breathing comfortably on room air Heart: mild tachycardia Abdomen: soft, non-tender; gravid Extremities: no edema of bilateral lower extremities DTR's 3+ bilateral ankle reflexes, intermittent 1-2 beats clonus right ankle, but not consistent Presentation: cephalic Fetal monitoring: 135/mod/+a/-d Uterine activity: none     Prenatal labs: ABO, Rh: --/--/O POS (07/17 1844) Antibody:  neg Rubella:  imm Varicella: non-imm RPR:  NR HBsAg:  neg HIV:  NR GBS:  unknown Passed 1' GTT Genetic  screening  MaterniT LR Anatomy US  Normal Last US : At [redacted]w[redacted]d - cephalic presentation, EFW 2853 (84 %tile), AC 96%tile  Prenatal Transfer Tool  Maternal Diabetes: No Genetic Screening: Normal Maternal Ultrasounds/Referrals: Normal Fetal Ultrasounds or other Referrals:  None Maternal Substance Abuse:  No Significant Maternal Medications:  Procardia  previously Rx'ed for preterm contractions Significant Maternal Lab Results:  Other: GBS unknown Number of Prenatal Visits:greater than 3 verified prenatal visits Other Comments:  None  Results for orders placed or performed during the hospital encounter of 01/14/24 (from the past 24 hours)  CBC   Collection Time: 01/14/24  6:25 PM  Result Value Ref Range   WBC 13.4 (H) 4.0 - 10.5 K/uL   RBC 4.89 3.87 - 5.11 MIL/uL   Hemoglobin 12.1 12.0 - 15.0 g/dL   HCT 62.2 63.9 - 53.9 %   MCV 77.1 (L) 80.0 - 100.0 fL   MCH 24.7 (L) 26.0 - 34.0 pg   MCHC 32.1 30.0 - 36.0 g/dL   RDW 84.7 88.4 - 84.4 %   Platelets 382 150 - 400 K/uL   nRBC 0.0 0.0 - 0.2 %  Comprehensive metabolic panel   Collection Time: 01/14/24  6:25 PM  Result Value Ref Range   Sodium 138 135 - 145 mmol/L   Potassium 3.6 3.5 - 5.1 mmol/L   Chloride 109 98 - 111 mmol/L   CO2 20 (L) 22 - 32 mmol/L   Glucose, Bld 107 (H) 70 - 99 mg/dL   BUN 5 (L) 6 - 20 mg/dL   Creatinine, Ser 9.45 0.44 - 1.00 mg/dL   Calcium  9.0 8.9 - 10.3 mg/dL   Total Protein 5.9 (L) 6.5 - 8.1 g/dL   Albumin 2.3 (L) 3.5 - 5.0 g/dL   AST 15 15 - 41 U/L   ALT 14 0 - 44 U/L   Alkaline Phosphatase 91 38 - 126 U/L   Total Bilirubin 0.3 0.0 - 1.2 mg/dL   GFR, Estimated >39 >39 mL/min   Anion gap 9 5 - 15  Protein / creatinine ratio, urine   Collection Time: 01/14/24  6:25 PM  Result Value Ref Range   Creatinine, Urine 75 mg/dL   Total Protein, Urine 14 mg/dL   Protein Creatinine Ratio 0.19 (H) 0.00 - 0.15 mg/mg[Cre]    Patient Active Problem List   Diagnosis Date Noted   COVID-19 affecting pregnancy in  third trimester 01/04/2024   Maternal morbid obesity, antepartum (HCC) 12/28/2023   Headache in pregnancy, antepartum 11/23/2023   Back pain affecting pregnancy 10/31/2023   Maternal varicella, non-immune 10/05/2023   Supervision of high risk pregnancy in third trimester 07/12/2023   Morbid obesity with BMI of 45.0-49.9, adult (HCC) 04/27/2023   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   GERD (gastroesophageal reflux disease) 09/22/2022   Anxiety with depression 06/09/2022   Vitamin D  insufficiency 04/03/2020   Body mass index (BMI) of 40.1-44.9 in adult (HCC) 04/03/2020   Migraine with aura and without status migrainosus, not intractable 04/03/2020    Assessment/Plan:  DANIESHA DRIVER is a 23 y.o. G1P0000 at [redacted]w[redacted]d here for IOL due to new onset gHTN  #Labor:Will start IOL with dual cytotec  #Pain: Per pt request #FWB: Cat I #ID:  GBS unknown, swab sent #MOF: Both #MOC: Planning on POPs #Circ:  N/A  #gHTN: PEC labs wnl, initially presented w HA which improved w Tylenol  and vague visual changes which she states are resolving as well  will start on Procardia  30mg  daily  plan to reassess closely for change in sxs, watch BP closely  #Hx of migraines: F/b Dr. Lane  #BMI 49: Most recent growth scan at [redacted]w[redacted]d EFW 2853 (84 %tile), AC 96%tile  #Anxiety/depression: Early PPD screening  #Varicella non-immune  Alain Sor, MD OB Fellow, Faculty Practice [facility], Center for [facility] 01/14/24 9:09 PM

## 2024-01-15 ENCOUNTER — Inpatient Hospital Stay (HOSPITAL_COMMUNITY): Payer: 59 | Admitting: Anesthesiology

## 2024-01-15 ENCOUNTER — Encounter: Payer: Self-pay | Admitting: Obstetrics & Gynecology

## 2024-01-15 LAB — CBC
HCT: 40.4 % (ref 36.0–46.0)
Hemoglobin: 13.4 g/dL (ref 12.0–15.0)
MCH: 25.1 pg — ABNORMAL LOW (ref 26.0–34.0)
MCHC: 33.2 g/dL (ref 30.0–36.0)
MCV: 75.7 fL — ABNORMAL LOW (ref 80.0–100.0)
Platelets: 460 10*3/uL — ABNORMAL HIGH (ref 150–400)
RBC: 5.34 MIL/uL — ABNORMAL HIGH (ref 3.87–5.11)
RDW: 15.3 % (ref 11.5–15.5)
WBC: 17.5 10*3/uL — ABNORMAL HIGH (ref 4.0–10.5)
nRBC: 0 % (ref 0.0–0.2)

## 2024-01-15 LAB — RPR: RPR Ser Ql: NONREACTIVE

## 2024-01-15 LAB — CERVICOVAGINAL ANCILLARY ONLY
Chlamydia: NEGATIVE
Comment: NEGATIVE
Comment: NORMAL
Neisseria Gonorrhea: NEGATIVE

## 2024-01-15 MED ORDER — EPHEDRINE 5 MG/ML INJ: 10.0000 mg | INTRAVENOUS | Status: DC | PRN

## 2024-01-15 MED ORDER — DIPHENHYDRAMINE HCL 50 MG/ML IJ SOLN: 12.5000 mg | INTRAMUSCULAR | Status: DC | PRN

## 2024-01-15 MED ORDER — TERBUTALINE SULFATE 1 MG/ML IJ SOLN: 0.2500 mg | Freq: Once | INTRAMUSCULAR | Status: DC | PRN

## 2024-01-15 MED ORDER — FENTANYL-BUPIVACAINE-NACL 0.5-0.125-0.9 MG/250ML-% EP SOLN
12.0000 mL/h | EPIDURAL | Status: DC | PRN
  Administered 2024-01-15 – 2024-01-16 (×2): 12 mL/h via EPIDURAL
  Filled 2024-01-15 (×2): qty 250

## 2024-01-15 MED ORDER — LIDOCAINE HCL (PF) 1 % IJ SOLN
INTRAMUSCULAR | Status: DC | PRN
  Administered 2024-01-15 (×2): 4 mL via EPIDURAL

## 2024-01-15 MED ORDER — PHENYLEPHRINE 80 MCG/ML (10ML) SYRINGE FOR IV PUSH (FOR BLOOD PRESSURE SUPPORT): 80.0000 ug | PREFILLED_SYRINGE | INTRAVENOUS | Status: DC | PRN

## 2024-01-15 MED ORDER — MISOPROSTOL 25 MCG QUARTER TABLET
25.0000 ug | ORAL_TABLET | Freq: Once | ORAL | Status: AC
  Administered 2024-01-15: 25 ug via VAGINAL
  Filled 2024-01-15: qty 1

## 2024-01-15 MED ORDER — OXYTOCIN-SODIUM CHLORIDE 30-0.9 UT/500ML-% IV SOLN
1.0000 m[IU]/min | INTRAVENOUS | Status: DC
  Administered 2024-01-15 – 2024-01-16 (×2): 2 m[IU]/min via INTRAVENOUS
  Filled 2024-01-15: qty 500

## 2024-01-15 MED ORDER — MISOPROSTOL 25 MCG QUARTER TABLET
25.0000 ug | ORAL_TABLET | ORAL | Status: DC | PRN
  Administered 2024-01-15: 25 ug via VAGINAL
  Filled 2024-01-15: qty 1

## 2024-01-15 MED ORDER — LACTATED RINGERS IV SOLN
500.0000 mL | Freq: Once | INTRAVENOUS | Status: AC
  Administered 2024-01-15: 500 mL via INTRAVENOUS

## 2024-01-15 NOTE — Progress Notes (Signed)
 Labor Progress Note Catherine [name] is a 23 y.o. G1P0000 at [redacted]w[redacted]d here for IOL in setting of new onset gHTN  S: Doing well, no concerns.  O:  BP 125/66   Pulse 96   Temp 98.6 F (37 C) (Oral)   Resp (!) 22   Ht 5\' 3"  (1.6 m)   Wt 126.9 kg   LMP 04/29/2023 (Exact Date)   SpO2 99%   BMI 49.56 kg/m  EFM: 130/mod/+a/-d  CVE: Dilation: 6.5 Effacement (%): 90 Cervical Position: Middle Station: -3 Presentation: Vertex Exam by:: Hubert Madden, MD   A&P: 23 y.o. G1P0000 [redacted]w[redacted]d admitted for IOL #Labor: Unchanged exam -- discussed placement of IUPC with pt, who provided verbal consent  IUPC placed  cont Pitocin , uptitrate as able  baby is asynclitic and OP -- Programmer, applications, Charity fundraiser, will work on position changes w pt #Pain: Epidural #FWB: Cat I #GBS positive; cefazolin  d/t PCN allergy  #gHTN: Normotensive to mild range Bps since afternoon  PEC labs neg  continue procardia  30 XL daily  Melanie Spires, MD 9:26 PM

## 2024-01-15 NOTE — Progress Notes (Signed)
 Labor Progress Note  Feeling more crampy but doing well. Normotensive.  SCE 1-2/long/hi FHR 130bpm, moderate variability, +accels, no decels FB placed and filled with 40cc, came out while applying traction. Fentanyl  50mcg given for FB placement.  SCE 3/long/high  Will give additional dose of cytotec  25mcg vaginally. Anticipate AROM/pit with next check in 4h or sooner prn. Continue IOL   Loralyn Rochester, MD Obstetrician & Gynecologist, [facility] for [facility], [facility]

## 2024-01-15 NOTE — Anesthesia Procedure Notes (Signed)
 Epidural Patient location during procedure: OB Start time: 01/15/2024 3:25 PM End time: 01/15/2024 3:31 PM  Staffing Anesthesiologist: Conard Decent, MD Performed: anesthesiologist   Preanesthetic Checklist Completed: patient identified, IV checked, site marked, risks and benefits discussed, surgical consent, monitors and equipment checked, pre-op evaluation and timeout performed  Epidural Patient position: sitting Prep: DuraPrep and site prepped and draped Patient monitoring: continuous pulse ox and blood pressure Approach: midline Location: L3-L4 Injection technique: LOR saline  Needle:  Needle type: Tuohy  Needle gauge: 17 G Needle length: 9 cm and 9 Needle insertion depth: 8 cm Catheter type: closed end flexible Catheter size: 19 Gauge Catheter at skin depth: 13 cm Test dose: negative  Assessment Events: blood not aspirated, no cerebrospinal fluid, injection not painful, no injection resistance, no paresthesia and negative IV test  Additional Notes The patient has requested an epidural for labor pain management. Risks and benefits including, but not limited to, infection, bleeding, local anesthetic toxicity, headache, hypotension, back pain, block failure, etc. were discussed with the patient. The patient expressed understanding and consented to the procedure. I confirmed that the patient has no bleeding disorders and is not taking blood thinners. I confirmed the patient's last platelet count with the nurse. A time-out was performed immediately prior to the procedure. Please see nursing documentation for vital signs. Sterile technique was used throughout the whole procedure. Once LOR achieved, the epidural catheter threaded easily without resistance. Aspiration of the catheter was negative for blood and CSF. The epidural was dosed slowly and an infusion was started.  1 attempt(s)Reason for block:procedure for pain

## 2024-01-15 NOTE — Anesthesia Preprocedure Evaluation (Signed)
 Anesthesia Evaluation  Patient identified by MRN, date of birth, ID band Patient awake    Reviewed: Allergy & Precautions, NPO status , Patient's Chart, lab work & pertinent test results  History of Anesthesia Complications Negative for: history of anesthetic complications  Airway Mallampati: III  TM Distance: >3 FB Neck ROM: Full    Dental   Pulmonary asthma    Pulmonary exam normal breath sounds clear to auscultation       Cardiovascular hypertension (gestational), Pt. on medications  Rhythm:Regular Rate:Normal     Neuro/Psych  Headaches PSYCHIATRIC DISORDERS Anxiety Depression     Neuromuscular disease (lumbar disc herniation with radiculopathy)    GI/Hepatic Neg liver ROS,GERD  ,,  Endo/Other  neg diabetes  Class 3 obesity  Renal/GU negative Renal ROS     Musculoskeletal   Abdominal  (+) + obese  Peds  Hematology Lab Results      Component                Value               Date                      WBC                      17.5 (H)            01/15/2024                HGB                      13.4                01/15/2024                HCT                      40.4                01/15/2024                MCV                      75.7 (L)            01/15/2024                PLT                      460 (H)             01/15/2024              Anesthesia Other Findings Takes baby aspirin   Reproductive/Obstetrics (+) Pregnancy                             Anesthesia Physical Anesthesia Plan  ASA: 3  Anesthesia Plan: Epidural   Post-op Pain Management:    Induction:   PONV Risk Score and Plan:   Airway Management Planned: Natural Airway  Additional Equipment:   Intra-op Plan:   Post-operative Plan:   Informed Consent: I have reviewed the patients History and Physical, chart, labs and discussed the procedure including the risks, benefits and alternatives for the  proposed anesthesia with the patient or authorized representative who has indicated his/her understanding and acceptance.  Plan Discussed with: Anesthesiologist  Anesthesia Plan Comments: (I have discussed risks of neuraxial anesthesia including but not limited to infection, bleeding, nerve injury, back pain, headache, seizures, and failure of block. Patient denies bleeding disorders and is not currently anticoagulated. Labs have been reviewed. Risks and benefits discussed. All patient's questions answered.  )       Anesthesia Quick Evaluation

## 2024-01-15 NOTE — Progress Notes (Signed)
 Labor Progress Note Catherine [name] is a 23 y.o. G1P0000 at [redacted]w[redacted]d presented for gHTN S: Coping well. Discussed risks, benefits and indications for AROM and/or Pitocin ; pt and support agreeable. Ample time provided for questions, comments and concerns.   O:  BP 135/80   Pulse 90   Temp 98 F (36.7 C) (Oral)   Resp (!) 22   Ht 5\' 3"  (1.6 m)   Wt 126.9 kg   LMP 04/29/2023 (Exact Date)   SpO2 100%   BMI 49.56 kg/m  EFM: 155/moderate variability/accels present/ no decels (intermittently monitoring maternal heart rate)  CVE: Dilation: 5 Effacement (%): 80 Cervical Position: Posterior Station: -2 Presentation: Vertex Exam by:: dr Cooper Denver   A&P: 23 y.o. G1P0000 [redacted]w[redacted]d admitted for IOL #Labor: Progressing well. AROM clear. Will start pitocin  2x2 if unchanged at next check or contractions don't pick up after AROM.  #Pain: Coping well, aware of options and to ask for them if/when ready #FWB: Cat I #GBS positive; cefazolin  d/t PCN allergy  #gHTN: mild range BP, labs wnl, continue procardia  30 XL daily. Pt aware of need for repeat labs prior to epidural if desired.  Darrow End, MD 12:42 PM

## 2024-01-16 ENCOUNTER — Encounter (HOSPITAL_COMMUNITY): Payer: Self-pay | Admitting: Obstetrics and Gynecology

## 2024-01-16 ENCOUNTER — Encounter: Payer: Self-pay | Admitting: Obstetrics & Gynecology

## 2024-01-16 ENCOUNTER — Encounter (HOSPITAL_COMMUNITY)
Admission: AD | Disposition: A | Discharge status: Home / Self Care | Payer: Self-pay | Source: Home / Self Care | Attending: Obstetrics and Gynecology

## 2024-01-16 DIAGNOSIS — O99344 Other mental disorders complicating childbirth: Secondary | ICD-10-CM | POA: Diagnosis not present

## 2024-01-16 DIAGNOSIS — O99820 Streptococcus B carrier state complicating pregnancy: Secondary | ICD-10-CM | POA: Insufficient documentation

## 2024-01-16 DIAGNOSIS — Z3A37 37 weeks gestation of pregnancy: Secondary | ICD-10-CM

## 2024-01-16 DIAGNOSIS — O134 Gestational [pregnancy-induced] hypertension without significant proteinuria, complicating childbirth: Secondary | ICD-10-CM | POA: Diagnosis not present

## 2024-01-16 LAB — STREP GP B SUSCEPTIBILITY

## 2024-01-16 LAB — STREP GP B CULTURE+RFLX: Strep Gp B Culture+Rflx: POSITIVE — AB

## 2024-01-16 SURGERY — Surgical Case
Anesthesia: Epidural | Site: Abdomen

## 2024-01-16 MED ORDER — KETOROLAC TROMETHAMINE 30 MG/ML IJ SOLN: 30.0000 mg | Freq: Once | INTRAMUSCULAR | Status: AC | PRN

## 2024-01-16 MED ORDER — OXYTOCIN-SODIUM CHLORIDE 30-0.9 UT/500ML-% IV SOLN
INTRAVENOUS | Status: DC | PRN
  Administered 2024-01-16: 300 mL via INTRAVENOUS

## 2024-01-16 MED ORDER — LACTATED RINGERS IV SOLN: INTRAVENOUS | Status: DC

## 2024-01-16 MED ORDER — FENTANYL CITRATE (PF) 100 MCG/2ML IJ SOLN: 25.0000 ug | INTRAMUSCULAR | Status: DC | PRN

## 2024-01-16 MED ORDER — SODIUM CHLORIDE 0.9 % IV SOLN: 3.0000 g | INTRAVENOUS | Status: DC

## 2024-01-16 MED ORDER — SODIUM CHLORIDE 0.9 % IV SOLN: 500.0000 mg | INTRAVENOUS | Status: DC

## 2024-01-16 MED ORDER — ACETAMINOPHEN 10 MG/ML IV SOLN: 1000.0000 mg | Freq: Once | INTRAVENOUS | Status: DC | PRN

## 2024-01-16 MED ORDER — ACETAMINOPHEN 10 MG/ML IV SOLN
INTRAVENOUS | Status: DC | PRN
  Administered 2024-01-16: 1000 mg via INTRAVENOUS

## 2024-01-16 MED ORDER — LIDOCAINE-EPINEPHRINE (PF) 2 %-1:200000 IJ SOLN
INTRAMUSCULAR | Status: DC | PRN
  Administered 2024-01-16 (×3): 2 mL via EPIDURAL
  Administered 2024-01-16: 5 mL via EPIDURAL
  Administered 2024-01-16: 3 mL via EPIDURAL

## 2024-01-16 MED ORDER — DEXAMETHASONE SODIUM PHOSPHATE 10 MG/ML IJ SOLN
INTRAMUSCULAR | Status: DC | PRN
  Administered 2024-01-16: 10 mg via INTRAVENOUS

## 2024-01-16 MED ORDER — FENTANYL CITRATE (PF) 100 MCG/2ML IJ SOLN
INTRAMUSCULAR | Status: DC | PRN
  Administered 2024-01-16: 100 ug via EPIDURAL

## 2024-01-16 MED ORDER — CLINDAMYCIN PHOSPHATE 900 MG/50ML IV SOLN
INTRAVENOUS | Status: AC
  Filled 2024-01-16: qty 50

## 2024-01-16 MED ORDER — MORPHINE SULFATE (PF) 0.5 MG/ML IJ SOLN
INTRAMUSCULAR | Status: DC | PRN
  Administered 2024-01-16: 3 mg via EPIDURAL

## 2024-01-16 MED ORDER — LACTATED RINGERS IV SOLN: INTRAVENOUS | Status: DC | PRN

## 2024-01-16 MED ORDER — ONDANSETRON HCL 4 MG/2ML IJ SOLN
INTRAMUSCULAR | Status: DC | PRN
  Administered 2024-01-16: 4 mg via INTRAVENOUS

## 2024-01-16 MED ORDER — PROMETHAZINE (PHENERGAN) 6.25MG IN NS 50ML IVPB: 6.2500 mg | INTRAVENOUS | Status: DC | PRN

## 2024-01-16 MED ORDER — CALCIUM CARBONATE ANTACID 500 MG PO CHEW
1.0000 | CHEWABLE_TABLET | Freq: Once | ORAL | Status: AC
  Administered 2024-01-16: 200 mg via ORAL
  Filled 2024-01-16: qty 1

## 2024-01-16 MED ORDER — MORPHINE SULFATE (PF) 0.5 MG/ML IJ SOLN
INTRAMUSCULAR | Status: AC
  Filled 2024-01-16: qty 10

## 2024-01-16 MED ORDER — SODIUM CHLORIDE 0.9 % IR SOLN
Status: DC | PRN
  Administered 2024-01-16: 1

## 2024-01-16 MED ORDER — DEXMEDETOMIDINE HCL IN NACL 80 MCG/20ML IV SOLN
INTRAVENOUS | Status: DC | PRN
  Administered 2024-01-16: 8 ug via INTRAVENOUS
  Administered 2024-01-16: 4 ug via INTRAVENOUS

## 2024-01-16 MED ORDER — STERILE WATER FOR IRRIGATION IR SOLN
Status: DC | PRN
  Administered 2024-01-16: 1000 mL

## 2024-01-16 MED ORDER — SOD CITRATE-CITRIC ACID 500-334 MG/5ML PO SOLN
30.0000 mL | ORAL | Status: AC
  Administered 2024-01-16: 30 mL via ORAL

## 2024-01-16 MED ORDER — DEXTROSE 5 % IV SOLN
INTRAVENOUS | Status: DC | PRN
  Administered 2024-01-16: 3 g via INTRAVENOUS

## 2024-01-16 MED ORDER — TRANEXAMIC ACID-NACL 1000-0.7 MG/100ML-% IV SOLN
INTRAVENOUS | Status: DC | PRN
  Administered 2024-01-16: 1000 mg via INTRAVENOUS

## 2024-01-16 MED ORDER — FENTANYL CITRATE (PF) 100 MCG/2ML IJ SOLN
INTRAMUSCULAR | Status: AC
  Filled 2024-01-16: qty 2

## 2024-01-16 SURGICAL SUPPLY — 23 items
CANISTER WOUND CARE 500ML ATS (WOUND CARE) IMPLANT
CHLORAPREP W/TINT 26 (MISCELLANEOUS) ×2 IMPLANT
DRESSING PREVENA PLUS CUSTOM (GAUZE/BANDAGES/DRESSINGS) IMPLANT
DRSG PREVENA PLUS CUSTOM (GAUZE/BANDAGES/DRESSINGS) ×1 IMPLANT
ELECT REM PT RETURN 9FT ADLT (ELECTROSURGICAL) ×1 IMPLANT
ELECTRODE REM PT RTRN 9FT ADLT (ELECTROSURGICAL) ×1 IMPLANT
GLOVE BIOGEL PI IND STRL 6.5 (GLOVE) ×1 IMPLANT
GLOVE BIOGEL PI IND STRL 7.0 (GLOVE) ×1 IMPLANT
GLOVE SURG SS PI 6.5 STRL IVOR (GLOVE) ×1 IMPLANT
GOWN STRL REUS W/TWL LRG LVL3 (GOWN DISPOSABLE) ×2 IMPLANT
KIT ABG SYR 3ML LUER SLIP (SYRINGE) IMPLANT
NDL HYPO 25X5/8 SAFETYGLIDE (NEEDLE) IMPLANT
NEEDLE HYPO 25X5/8 SAFETYGLIDE (NEEDLE) IMPLANT
NS IRRIG 1000ML POUR BTL (IV SOLUTION) ×1 IMPLANT
PACK C SECTION WH (CUSTOM PROCEDURE TRAY) ×1 IMPLANT
PAD OB MATERNITY 4.3X12.25 (PERSONAL CARE ITEMS) ×1 IMPLANT
RTRCTR C-SECT PINK 25CM LRG (MISCELLANEOUS) IMPLANT
SUT PLAIN 0 NONE (SUTURE) IMPLANT
SUT VIC AB 0 CT1 36 (SUTURE) ×4 IMPLANT
SUT VIC AB 4-0 KS 27 (SUTURE) ×1 IMPLANT
SUTURE PLAIN GUT 2.0 ETHICON (SUTURE) IMPLANT
TOWEL OR 17X24 6PK STRL BLUE (TOWEL DISPOSABLE) ×1 IMPLANT
WATER STERILE IRR 1000ML POUR (IV SOLUTION) ×1 IMPLANT

## 2024-01-16 NOTE — Progress Notes (Signed)
Labor Progress Note Catherine [name] is a 23 y.o. G1P0000 at [redacted]w[redacted]d here for IOL in setting of new onset gHTN  S: At bedside to recheck cx  O:  BP 127/88   Pulse 61   Temp 98.6 F (37 C) (Oral)   Resp 20   Ht 5\' 3"  (1.6 m)   Wt 126.9 kg   LMP 04/29/2023 (Exact Date)   SpO2 99%   BMI 49.56 kg/m  EFM: 125/mod/+a/+variables  CVE: Dilation: 7 Effacement (%): 90 Cervical Position: Middle Station: -3, -2 Presentation: Vertex Exam by:: Lucianne Muss, MD   A&P: 23 y.o. G1P0000 [redacted]w[redacted]d admitted for IOL #Labor: Cx with some change, baby still feels OP and very asynclitic, MVUs adequate but suboptimal ctx pattern, will cont position changes to encourage rotation of baby #Pain: Epidural #FWB: Cat I #GBS positive; cefazolin d/t PCN allergy  #gHTN: Normotensive  PEC labs neg  continue procardia 30 XL daily  Sundra Aland, MD 1:47 AM

## 2024-01-16 NOTE — Progress Notes (Signed)
Pt comfortable with epidural MVUs resistant to up titration of pitocin, minimal cervical change and approaching 24 hours ROM and Pitocin. EFM: Cat I  Blood pressure 137/85, pulse 83, temperature 97.9 F (36.6 C), temperature source Axillary, resp. rate 18, height 5\' 3"  (1.6 m), weight 279 lb 12.8 oz (126.9 kg), last menstrual period 04/29/2023, SpO2 99%.  Will trial 4 hour pitocin break; restart at 1400.   Wyn Forster, MD FMOB Fellow, Faculty practice [facility], Center for [facility]

## 2024-01-16 NOTE — Progress Notes (Signed)
Catherine [name] is a 23 y.o. G1P0000 at [redacted]w[redacted]d by admitted for induction of labor due to [facility].  Subjective: Patient is doing well and is without complaints  Objective: BP (!) 143/77   Pulse (!) 106   Temp 98.7 F (37.1 C)   Resp 16   Ht 5\' 3"  (1.6 m)   Wt 126.9 kg   LMP 04/29/2023 (Exact Date)   SpO2 99%   BMI 49.56 kg/m  I/O last 3 completed shifts: In: -  Out: 2275 [Urine:2275] Total I/O In: -  Out: 1300 [Urine:1300]  FHT:  baseline 135, mod variability, +accels, no decels UC:   regular, every 3 minutes SVE:   Dilation: 6.5 Effacement (%): 80 Station: -1 Exam by:: Gurleen Larrivee MD  Labs: Lab Results  Component Value Date   WBC 17.5 (H) 01/15/2024   HGB 13.4 01/15/2024   HCT 40.4 01/15/2024   MCV 75.7 (L) 01/15/2024   PLT 460 (H) 01/15/2024    Assessment / Plan: Arrest in active phase of labor  Labor: Cervical exam unchanged despite being on pitocin for over 8 hours. Recommend delivery via cesarean section due to failure to progress. Risks, benefits and alternatives were explained including but not limited to risks of bleeding, infection and damage to adjacent organs. Patient verbalized understanding and and all questions were answered. OR staff notified Fetal Wellbeing:  Category II Pain Control:  Epidural   Catalina Antigua, MD 01/16/2024, 10:20 PM

## 2024-01-16 NOTE — Progress Notes (Signed)
Brief labor note:  Cx rechecked by RN, unchanged from prior. Cat II but overall reassuring strip -- having occ variables. MVUs adequate, but ctx still spaced out every 1-5 min. Pitocin at 14. At four hour mark of unchanged cx, however given ctx pattern still not optimal, will continue to uptitrate Pitocin to ensure ctx closer together (every 2-3 min). If still unchanged despite better ctx pattern, would consider arrest of dilation and recommend proceeding w C/S. Patient discussed with Dr. Debroah Loop.  Sundra Aland, MD OB Fellow, Faculty Practice [facility], Center for [facility]

## 2024-01-16 NOTE — Op Note (Signed)
Catherine [name] PROCEDURE DATE: 01/14/2024 - 01/16/2024  PREOPERATIVE DIAGNOSIS: Intrauterine pregnancy at  [redacted]w[redacted]d weeks gestation; failure to progress: arrest of dilation with gestational hypertension  POSTOPERATIVE DIAGNOSIS: The same  PROCEDURE:     Cesarean Section  SURGEON:  Dr. Catalina Antigua  ASSISTANT: Dr. Alyse Low  An experienced assistant was required given the standard of surgical care given the complexity of the case.  This assistant was needed for exposure, dissection, suctioning, retraction, instrument exchange, assisting with delivery with administration of fundal pressure, and for overall help during the procedure.   INDICATIONS: Catherine [name] is a 23 y.o. G1P1001 at [redacted]w[redacted]d scheduled for cesarean section secondary to failure to progress: arrest of dilation.  The risks of cesarean section discussed with the patient included but were not limited to: bleeding which may require transfusion or reoperation; infection which may require antibiotics; injury to bowel, bladder, ureters or other surrounding organs; injury to the fetus; need for additional procedures including hysterectomy in the event of a life-threatening hemorrhage; placental abnormalities wth subsequent pregnancies, incisional problems, thromboembolic phenomenon and other postoperative/anesthesia complications. The patient concurred with the proposed plan, giving informed written consent for the procedure.    FINDINGS:  Viable female infant in cephalic presentation, nuchal cord x 1, easily reduced.  Apgars 9 and 9.  Clear amniotic fluid.  Intact placenta, three vessel cord.  Normal uterus, fallopian tubes and ovaries bilaterally.  ANESTHESIA:    Spinal INTRAVENOUS FLUIDS:1000 ml ESTIMATED BLOOD LOSS: 348 ml URINE OUTPUT:  100 ml SPECIMENS: Placenta sent to L&D COMPLICATIONS: None immediate  PROCEDURE IN DETAIL:  The patient received intravenous antibiotics and had sequential compression devices applied to her lower  extremities while in the preoperative area.  She was then taken to the operating room where anesthesia was induced and was found to be adequate. A foley catheter was placed into her bladder and attached to Catherine [name] gravity. She was then placed in a dorsal supine position with a leftward tilt, and prepped and draped in a sterile manner. After an adequate timeout was performed, a Pfannenstiel skin incision was made with scalpel and carried through to the underlying layer of fascia. The fascia was incised in the midline and this incision was extended bilaterally bluntly. The rectus muscles were separated in the midline bluntly and the peritoneum was entered bluntly. The Alexis self-retaining retractor was introduced into the abdominal cavity. Attention was turned to the lower uterine segment where a bladder flap was created, and a transverse hysterotomy was made with a scalpel and extended bilaterally bluntly. The infant was successfully delivered, and cord was clamped and cut and infant was handed over to awaiting neonatology team. Uterine massage was then administered and the placenta delivered intact with three-vessel cord. The uterus was cleared of clot and debris.  The hysterotomy was closed with 0 Vicryl in a running locked fashion, and an imbricating layer was also placed with a 0 Vicryl. Overall, excellent hemostasis was noted. The pelvis copiously irrigated and cleared of all clot and debris. Hemostasis was confirmed on all surfaces.  The peritoneum and the muscles were reapproximated using 0 vicryl interrupted stitches. The fascia was then closed using 0 Vicryl in a running fashion.  The subcutaneous layer was reapproximated with plain gut and the skin was closed in a subcuticular fashion using 3.0 Vicryl. The patient tolerated the procedure well. Sponge, lap, instrument and needle counts were correct x 2. She was taken to the recovery room in stable condition. Prevena wound vac was  applied over her  incision   Catherine [name] ConstantMD  01/16/2024 11:53 PM

## 2024-01-16 NOTE — Progress Notes (Signed)
Labor Progress Note Catherine [name] is a 23 y.o. G1P0000 at [redacted]w[redacted]d presented for IOL due to gHTN  S: Reports feeling more rectal pressure over past hour  O:  BP 136/89   Pulse (!) 113   Temp 98.7 F (37.1 C)   Resp 16   Ht 5\' 3"  (1.6 m)   Wt 126.9 kg   LMP 04/29/2023 (Exact Date)   SpO2 99%   BMI 49.56 kg/m  EFM: 135/mod/+a/occ variables  CVE: Dilation: 6.5 Effacement (%): 80 Cervical Position: Middle Station: -1 Presentation: Vertex Exam by:: B boyer RN   A&P: 23 y.o. G1P0000 [redacted]w[redacted]d here for IOL due to gHTN #Labor: Dilation unchanged, but station lower per RN  discussed possibility of arrest of descent now that we are 6 hours from re-initiation of pitocin and possible need for pLTCS  pt understanding that pLTCS may be required  will recheck 2 hours from last check (2200) and decide on possibility for C/S #Pain: Epidural #FWB: Cat II, but reassuring #GBS pos, on Ancef  #gHTN: Normotensive  PEC labs neg  continue procardia 30 XL daily   Sundra Aland, MD 9:47 PM

## 2024-01-17 ENCOUNTER — Encounter (HOSPITAL_COMMUNITY): Payer: Self-pay | Admitting: Obstetrics and Gynecology

## 2024-01-17 ENCOUNTER — Other Ambulatory Visit: Payer: Self-pay

## 2024-01-17 DIAGNOSIS — Z98891 History of uterine scar from previous surgery: Principal | ICD-10-CM

## 2024-01-17 LAB — CBC
HCT: 32.7 % — ABNORMAL LOW (ref 36.0–46.0)
Hemoglobin: 10.6 g/dL — ABNORMAL LOW (ref 12.0–15.0)
MCH: 25 pg — ABNORMAL LOW (ref 26.0–34.0)
MCHC: 32.4 g/dL (ref 30.0–36.0)
MCV: 77.1 fL — ABNORMAL LOW (ref 80.0–100.0)
Platelets: 341 10*3/uL (ref 150–400)
RBC: 4.24 MIL/uL (ref 3.87–5.11)
RDW: 15.3 % (ref 11.5–15.5)
WBC: 17.5 10*3/uL — ABNORMAL HIGH (ref 4.0–10.5)
nRBC: 0 % (ref 0.0–0.2)

## 2024-01-17 LAB — CREATININE, SERUM
Creatinine, Ser: 0.66 mg/dL (ref 0.44–1.00)
GFR, Estimated: >60 mL/min (ref >60)

## 2024-01-17 MED ORDER — MISOPROSTOL 200 MCG PO TABS
1000.0000 ug | ORAL_TABLET | Freq: Once | ORAL | Status: AC
  Administered 2024-01-17: 1000 ug via RECTAL

## 2024-01-17 MED ORDER — MISOPROSTOL 200 MCG PO TABS
ORAL_TABLET | ORAL | Status: AC
  Filled 2024-01-17: qty 5

## 2024-01-17 MED ORDER — PRENATAL MULTIVITAMIN CH
1.0000 | ORAL_TABLET | Freq: Every day | ORAL | Status: DC
  Administered 2024-01-17: 1 via ORAL
  Filled 2024-01-17: qty 1

## 2024-01-17 MED ORDER — LIDOCAINE-EPINEPHRINE (PF) 2 %-1:200000 IJ SOLN
INTRAMUSCULAR | Status: AC
Start: 2024-01-17 — End: ?
  Filled 2024-01-17: qty 20

## 2024-01-17 MED ORDER — SIMETHICONE 80 MG PO CHEW: 80.0000 mg | CHEWABLE_TABLET | ORAL | Status: DC | PRN

## 2024-01-17 MED ORDER — OXYTOCIN-SODIUM CHLORIDE 30-0.9 UT/500ML-% IV SOLN
2.5000 [IU]/h | INTRAVENOUS | Status: AC
  Administered 2024-01-17: 2.5 [IU]/h via INTRAVENOUS
  Filled 2024-01-17: qty 500

## 2024-01-17 MED ORDER — ARTIFICIAL TEARS OPHTHALMIC OINT
TOPICAL_OINTMENT | OPHTHALMIC | Status: AC
  Filled 2024-01-17: qty 3.5

## 2024-01-17 MED ORDER — FERROUS SULFATE 325 (65 FE) MG PO TABS
325.0000 mg | ORAL_TABLET | ORAL | Status: DC
  Administered 2024-01-17: 325 mg via ORAL
  Filled 2024-01-17: qty 1

## 2024-01-17 MED ORDER — SODIUM CHLORIDE 0.9 % IV SOLN
INTRAVENOUS | Status: AC
  Filled 2024-01-17: qty 3

## 2024-01-17 MED ORDER — SIMETHICONE 80 MG PO CHEW
80.0000 mg | CHEWABLE_TABLET | Freq: Three times a day (TID) | ORAL | Status: DC
  Administered 2024-01-17 – 2024-01-18 (×4): 80 mg via ORAL
  Filled 2024-01-17 (×4): qty 1

## 2024-01-17 MED ORDER — ENOXAPARIN SODIUM 40 MG/0.4ML IJ SOSY: 40.0000 mg | PREFILLED_SYRINGE | INTRAMUSCULAR | Status: DC

## 2024-01-17 MED ORDER — SCOPOLAMINE 1 MG/3DAYS TD PT72
MEDICATED_PATCH | TRANSDERMAL | Status: AC
  Filled 2024-01-17: qty 1

## 2024-01-17 MED ORDER — OXYTOCIN-SODIUM CHLORIDE 30-0.9 UT/500ML-% IV SOLN
INTRAVENOUS | Status: AC
  Filled 2024-01-17: qty 500

## 2024-01-17 MED ORDER — COCONUT OIL OIL: 1.0000 | TOPICAL_OIL | Status: DC | PRN

## 2024-01-17 MED ORDER — WITCH HAZEL-GLYCERIN EX PADS: 1.0000 | MEDICATED_PAD | CUTANEOUS | Status: DC | PRN

## 2024-01-17 MED ORDER — FUROSEMIDE 20 MG PO TABS
20.0000 mg | ORAL_TABLET | Freq: Every day | ORAL | Status: DC
  Administered 2024-01-17 – 2024-01-18 (×2): 20 mg via ORAL
  Filled 2024-01-17 (×2): qty 1

## 2024-01-17 MED ORDER — IBUPROFEN 600 MG PO TABS
600.0000 mg | ORAL_TABLET | Freq: Four times a day (QID) | ORAL | Status: DC
  Administered 2024-01-17 – 2024-01-18 (×5): 600 mg via ORAL
  Filled 2024-01-17 (×5): qty 1

## 2024-01-17 MED ORDER — ENOXAPARIN SODIUM 60 MG/0.6ML IJ SOSY: 60.0000 mg | PREFILLED_SYRINGE | INTRAMUSCULAR | Status: DC

## 2024-01-17 MED ORDER — MENTHOL 3 MG MT LOZG: 1.0000 | LOZENGE | OROMUCOSAL | Status: DC | PRN

## 2024-01-17 MED ORDER — KETOROLAC TROMETHAMINE 30 MG/ML IJ SOLN
INTRAMUSCULAR | Status: AC
  Filled 2024-01-17: qty 1

## 2024-01-17 MED ORDER — DIPHENHYDRAMINE HCL 25 MG PO CAPS: 25.0000 mg | ORAL_CAPSULE | Freq: Four times a day (QID) | ORAL | Status: DC | PRN

## 2024-01-17 MED ORDER — DEXAMETHASONE SODIUM PHOSPHATE 10 MG/ML IJ SOLN
INTRAMUSCULAR | Status: AC
  Filled 2024-01-17: qty 1

## 2024-01-17 MED ORDER — KETOROLAC TROMETHAMINE 30 MG/ML IJ SOLN
30.0000 mg | Freq: Once | INTRAMUSCULAR | Status: AC
  Administered 2024-01-17: 30 mg via INTRAMUSCULAR

## 2024-01-17 MED ORDER — ONDANSETRON HCL 4 MG/2ML IJ SOLN
INTRAMUSCULAR | Status: AC
  Filled 2024-01-17: qty 2

## 2024-01-17 MED ORDER — OXYCODONE HCL 5 MG PO TABS: 5.0000 mg | ORAL_TABLET | ORAL | Status: DC | PRN

## 2024-01-17 MED ORDER — SENNOSIDES-DOCUSATE SODIUM 8.6-50 MG PO TABS
2.0000 | ORAL_TABLET | Freq: Every day | ORAL | Status: DC
  Administered 2024-01-17 – 2024-01-18 (×2): 2 via ORAL
  Filled 2024-01-17 (×2): qty 2

## 2024-01-17 MED ORDER — DIBUCAINE (PERIANAL) 1 % EX OINT: 1.0000 | TOPICAL_OINTMENT | CUTANEOUS | Status: DC | PRN

## 2024-01-17 NOTE — Progress Notes (Signed)
POSTPARTUM PROGRESS NOTE  POD #1  Subjective:  Catherine [name] is a 23 y.o. G1P1001 s/p pLTCS at [redacted]w[redacted]d.  She reports she doing well. No acute events overnight. She reports she is doing well. She denies any problems with ambulating, voiding or po intake. Denies nausea or vomiting. She has not passed flatus. Pain is well controlled.  Lochia is appropriate .  Objective: Blood pressure 133/84, pulse (!) 102, temperature 97.7 F (36.5 C), temperature source Axillary, resp. rate 20, height 5\' 3"  (1.6 m), weight 126.9 kg, last menstrual period 04/29/2023, SpO2 98%, unknown if currently breastfeeding.  Physical Exam:  General: alert, cooperative and no distress Chest: no respiratory distress Heart:regular rate, distal pulses intact Abdomen: soft, nontender,  Uterine Fundus: firm, appropriately tender DVT Evaluation: No calf swelling or tenderness Extremities: trace edema Skin: warm, dry; incision clean/dry/intact w/ provena dressing in place  Recent Labs    01/15/24 1442 01/17/24 0114  HGB 13.4 10.6*  HCT 40.4 32.7*    Assessment/Plan: [name] is a 23 y.o. G1P1001 s/p pLTCS at [redacted]w[redacted]d for AOD.  POD#1 - Doing welll; pain well controlled. H/H appropriate  Routine postpartum care  OOB, ambulated  Lovenox for VTE prophylaxis Acute blood loss Anemia: asymptomatic  Start po ferrous sulfate every other day   Contraception: POPs> OP Mirena IUD Feeding: Breast  Dispo: Plan for discharge tomorrow or the next day .   LOS: 3 days   Derrel Nip, MD Attending Family Medicine Physician, [facility] for [facility], [facility]   01/17/2024, 11:01 AM

## 2024-01-17 NOTE — Anesthesia Postprocedure Evaluation (Signed)
Anesthesia Post Note  Patient: Catherine [name]  Procedure(s) Performed: CESAREAN SECTION (Abdomen)     Patient location during evaluation: PACU Anesthesia Type: Epidural Level of consciousness: awake Pain management: pain level controlled Vital Signs Assessment: post-procedure vital signs reviewed and stable Respiratory status: spontaneous breathing, nonlabored ventilation and respiratory function stable Cardiovascular status: stable Postop Assessment: no headache, no backache and epidural receding Anesthetic complications: no   No notable events documented.  Last Vitals:  Vitals:   01/17/24 0140 01/17/24 0145  BP:  (!) 142/84  Pulse: (!) 101 94  Resp: 17 (!) 21  Temp:    SpO2: 96% 96%    Last Pain:  Vitals:   01/17/24 0145  TempSrc:   PainSc: 0-No pain    LLE Motor Response: Purposeful movement (01/17/24 0145) LLE Sensation: Full sensation (01/17/24 0145) RLE Motor Response: Purposeful movement (01/17/24 0145) RLE Sensation: Full sensation (01/17/24 0145)      Linton Rump

## 2024-01-17 NOTE — Social Work (Signed)
CSW received consult for hx of Anxiety and Depression.  CSW met with MOB to offer support and complete assessment. CSW entered the room and observed MOB in bed and FOB at bedside holding the infant. CSW introduced self, CSW role and reason for visit. MOB allowed FOB to remain in the room during the assessment. 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 CSW inquired about current treatment, MOB  reported she no longer takes medication nor is she involved in therapy. MOB reported she and her partners relationship has improved and they are able  are able to talk things out which has helped with her MH. CSW assessed for safety, MOB denied any SI or HI. CSW provided education regarding the baby blues period vs. perinatal mood disorders, discussed treatment and gave resources for mental health follow up if concerns arise.  CSW recommends self-evaluation during the postpartum time period using the New Mom Checklist from Postpartum Progress and encouraged MOB to contact a medical professional if symptoms are noted at any time.  MOB identified FOB, her brother, sister and grandmother as her supports.   CSW provided review of Sudden Infant Death Syndrome (SIDS) precautions.  MOB reported she has all necessary items for the infant including a bassinet, crib and car seat.  CSW identifies no further need for intervention and no barriers to discharge at this time.  Wende Neighbors, LCSWA Clinical Social Worker [phone]

## 2024-01-17 NOTE — Transfer of Care (Signed)
Immediate Anesthesia Transfer of Care Note  Patient: Catherine [name]  Procedure(s) Performed: CESAREAN SECTION (Abdomen)  Patient Location: PACU  Anesthesia Type:Epidural  Level of Consciousness: awake, alert , and oriented  Airway & Oxygen Therapy: Patient Spontanous Breathing  Post-op Assessment: Report given to RN and Post -op Vital signs reviewed and stable  Post vital signs: Reviewed and stable  Last Vitals:  Vitals Value Taken Time  BP 151/90 01/17/24 0023  Temp    Pulse 102 01/17/24 0029  Resp 21 01/17/24 0029  SpO2 98 % 01/17/24 0029  Vitals shown include unfiled device data.  Last Pain:  Vitals:   01/16/24 2200  TempSrc:   PainSc: 0-No pain         Complications: No notable events documented.

## 2024-01-17 NOTE — Lactation Note (Signed)
This note was copied from a baby's chart. Lactation Consultation Note  Patient Name: Catherine [name] [id number] Date: 01/17/2024 Age:23 hours Reason for consult: Initial assessment;Primapara;1st time breastfeeding;Early term 37-38.6wks According to the doc flow sheets baby has latched x 1 3 mins after birth.  Per mom a pump was set up due to a difficult time latching.  Per doc flow sheets mom has pumped x 18-13, and 5 ml and fed back to baby.  Per mom the baby recently fed at 12 noon. Per mom would like to breast feed. LC recommended baby is showing feeding cues to call on the nurses light for Lactation for a feeding assessment, pump review and flange check.   Maternal Data Does the patient have breastfeeding experience prior to this delivery?: No  Feeding Mother's Current Feeding Choice: Breast Milk and Formula Nipple Type: Slow - flow  LATCH Score    Lactation Tools Discussed/Used Tools: Pump Breast pump type: Double-Electric Breast Pump Pump Education: Setup, frequency, and cleaning;Milk Storage Reason for Pumping: DL , and per mom the nurse set up the pump Pumped volume: 18 mL  Interventions Interventions: Education;DEBP  Discharge    Consult Status Consult Status: Follow-up Date: 01/17/24 Follow-up type: In-patient    Catherine [name] 01/17/2024, 12:55 PM

## 2024-01-17 NOTE — Lactation Note (Signed)
This note was copied from a baby's chart. Lactation Consultation Note  Patient Name: Catherine [name] [id number] Date: 01/17/2024 Age:23 hours Reason for consult: Follow-up assessment;Primapara;1st time breastfeeding;Early term 37-38.6wks;Mother's request;Breastfeeding assistance  P1- MOB requested latching assistance. Per MOB, infant has not been to the breast yet. LC assisted MOB with placing infant on the left breast in the football hold. LC demonstrated how to compress the breast to latch infant. Infant latched immediately and had a strong rhythmic suck. MOB denied feeling any pain or pinching while infant nursed. Infant was still nursing when [facility] left room. LC encouraged MOB to always latch infant to the breast, before offering formula. MOB denies having any questions or concerns. LC encouraged MOB to call for further assistance as needed.  Maternal Data Has patient been taught Hand Expression?: Yes Does the patient have breastfeeding experience prior to this delivery?: No  Feeding Mother's Current Feeding Choice: Breast Milk and Formula Nipple Type: Slow - flow  LATCH Score Latch: Grasps breast easily, tongue down, lips flanged, rhythmical sucking.  Audible Swallowing: A few with stimulation  Type of Nipple: Everted at rest and after stimulation  Comfort (Breast/Nipple): Soft / non-tender  Hold (Positioning): Assistance needed to correctly position infant at breast and maintain latch.  LATCH Score: 8   Lactation Tools Discussed/Used Tools: Pump Breast pump type: Double-Electric Breast Pump Pump Education: Milk Storage Reason for Pumping: DL , and per mom the nurse set up the pump Pumped volume: 18 mL  Interventions Interventions: Breast feeding basics reviewed;Assisted with latch;Breast compression;Adjust position;Support pillows;Position options;Education;LC Services brochure  Discharge Discharge Education: Warning signs for feeding baby Pump: DEBP;Hands  Free;Personal  Consult Status Consult Status: Follow-up Date: 01/18/24 Follow-up type: In-patient    Dema Severin BS, IBCLC 01/17/2024, 3:55 PM

## 2024-01-17 NOTE — Discharge Summary (Signed)
Postpartum Discharge Summary  Date of Service updated***     Patient Name: Catherine [name] DOB: [birth year] MRN: [id number]  Date of admission: 01/14/2024 Delivery date:01/16/2024 Delivering provider: CONSTANT, PEGGY Date of discharge: 01/17/2024  Admitting diagnosis: Gestational hypertension [O13.9] Intrauterine pregnancy: [redacted]w[redacted]d     Secondary diagnosis:  Principal Problem:   S/P primary low transverse C-section Active Problems:   Gestational hypertension  Additional problems: ***    Discharge diagnosis: Term Pregnancy Delivered and Gestational Hypertension                                              Post partum procedures:{Postpartum procedures:23558} Augmentation: AROM, Pitocin, Cytotec, and IP Foley Complications: {OB Labor/Delivery Complications:20784}  Hospital course: Induction of Labor With Cesarean Section   23 y.o. yo G1P1001 at [redacted]w[redacted]d was admitted to the hospital 01/14/2024 for induction of labor. Patient had a labor course significant for arrest of dilation at 6.5cm. The patient went for cesarean section due to Arrest of Dilation. Delivery details are as follows: Membrane Rupture Time/Date: 2:02 PM,01/15/2024  Delivery Method:C-Section, Low Transverse Operative Delivery:N/A Details of operation can be found in separate operative Note.  Patient had a postpartum course complicated by***. She is ambulating, tolerating a regular diet, passing flatus, and urinating well.  Patient is discharged home in stable condition on 01/17/24.      Newborn Data: Birth date:01/16/2024 Birth time:11:28 PM Gender:Female Living status:Living Apgars:9 ,9  Weight:3030 g                               Magnesium Sulfate received: {Mag received:30440022} BMZ received: No Rhophylac:N/A MMR:N/A T-DaP:Given prenatally Flu: Yes RSV Vaccine received: No Transfusion:{Transfusion received:30440034}  Immunizations received: Immunization History  Administered Date(s) Administered   Hepatitis A  07/26/2007   Influenza, Mdck, Trivalent,PF 6+ MOS(egg free) 10/05/2023   Tdap 11/09/2023    Physical exam  Vitals:   01/17/24 0024 01/17/24 0030 01/17/24 0045 01/17/24 0100  BP: (!) 151/90 (!) 141/87 139/80 (!) 145/95  Pulse: (!) 107 (!) 103 (!) 108 (!) 105  Resp: (!) 24 19 (!) 29 16  Temp: 98.1 F (36.7 C)  99.1 F (37.3 C)   TempSrc: Oral  Oral   SpO2: 99% 98% 97% 98%  Weight:      Height:       General: {Exam; general:21111117} Lochia: {Desc; appropriate/inappropriate:30686::"appropriate"} Uterine Fundus: {Desc; firm/soft:30687} Incision: {Exam; incision:21111123} DVT Evaluation: {Exam; WUJ:8119147} Labs: Lab Results  Component Value Date   WBC 17.5 (H) 01/15/2024   HGB 13.4 01/15/2024   HCT 40.4 01/15/2024   MCV 75.7 (L) 01/15/2024   PLT 460 (H) 01/15/2024      Latest Ref Rng & Units 01/14/2024    6:25 PM  CMP  Glucose 70 - 99 mg/dL 829   BUN 6 - 20 mg/dL 5   Creatinine 5.62 - 1.30 mg/dL 8.65   Sodium 784 - 696 mmol/L 138   Potassium 3.5 - 5.1 mmol/L 3.6   Chloride 98 - 111 mmol/L 109   CO2 22 - 32 mmol/L 20   Calcium 8.9 - 10.3 mg/dL 9.0   Total Protein 6.5 - 8.1 g/dL 5.9   Total Bilirubin 0.0 - 1.2 mg/dL 0.3   Alkaline Phos 38 - 126 U/L 91   AST 15 - 41 U/L 15  ALT 0 - 44 U/L 14    Edinburgh Score:     No data to display         No data recorded  After visit meds:  Allergies as of 01/17/2024       Reactions   Amoxicillin Hives     Med Rec must be completed prior to using this [facility]*        Discharge home in stable condition Infant Feeding: {Baby feeding:23562} Infant Disposition:{CHL IP OB HOME WITH GEXBMW:41324} Discharge instruction: per After Visit Summary and Postpartum booklet. Activity: Advance as tolerated. Pelvic rest for 6 weeks.  Diet: {OB MWNU:27253664} Future Appointments:No future appointments. Follow up Visit: Message sent 2/12  Please schedule this patient for a In person postpartum visit in 4 weeks with the  following provider: Any provider. Additional Postpartum F/U:Incision check 1 week and BP check 1 week  High risk pregnancy complicated by: HTN Delivery mode:  C-Section, Low Transverse Anticipated Birth Control:  POPs   01/17/2024 Sundra Aland, MD

## 2024-01-17 NOTE — Progress Notes (Signed)
RN called Dr. Lucianne Muss for increasing BP of 147/96 and 142/93. Pt with GHTN and currently asymptomatic.   Of note, pt with increased bleeding. 100 mL weighed over 2 hour span and 2 fundal assessments. Fundus 1 below umbilicus and firm, with slight trickling - small to moderate amounts.   OB busy in several deliveries, but requested secure chat to discuss plan of care. Per Dr. Lucianne Muss, RN to give Procardia dose early. See MAR. BP cuff size changed with slightly lower values. No new orders r/t bleeding, but MD aware.   Report given to daytime RN Heather.   Pt in no acute distress and all questions and concerns answered and addressed.    Elvia Collum, RN 01/17/24

## 2024-01-18 ENCOUNTER — Other Ambulatory Visit: Payer: 59

## 2024-01-18 ENCOUNTER — Encounter: Payer: 59 | Admitting: Obstetrics and Gynecology

## 2024-01-18 ENCOUNTER — Other Ambulatory Visit (HOSPITAL_COMMUNITY): Payer: Self-pay

## 2024-01-18 MED ORDER — FERROUS SULFATE 325 (65 FE) MG PO TABS
325.0000 mg | ORAL_TABLET | ORAL | 3 refills | Status: DC
  Filled 2024-01-18 – 2024-02-12 (×2): qty 30, 60d supply, fill #0

## 2024-01-18 MED ORDER — NIFEDIPINE ER 30 MG PO TB24
30.0000 mg | ORAL_TABLET | Freq: Every day | ORAL | 2 refills | Status: DC
  Filled 2024-01-18 – 2024-02-12 (×2): qty 30, 30d supply, fill #0

## 2024-01-18 MED ORDER — IBUPROFEN 600 MG PO TABS
600.0000 mg | ORAL_TABLET | Freq: Four times a day (QID) | ORAL | 0 refills | Status: DC | PRN
Start: 2024-01-18 — End: 2024-10-12
  Filled 2024-01-18: qty 30, 8d supply, fill #0

## 2024-01-18 MED ORDER — OXYCODONE HCL 5 MG PO TABS
5.0000 mg | ORAL_TABLET | ORAL | 0 refills | Status: DC | PRN
Start: 2024-01-18 — End: 2024-06-04
  Filled 2024-01-18: qty 30, 5d supply, fill #0

## 2024-01-18 MED ORDER — FUROSEMIDE 20 MG PO TABS
20.0000 mg | ORAL_TABLET | Freq: Every day | ORAL | 0 refills | Status: DC
  Filled 2024-01-18: qty 5, 5d supply, fill #0

## 2024-01-18 NOTE — Progress Notes (Signed)
Patient switched from hospital wound vac to the portable Prevena to send home with patient. Instructed discharge information and patient had no questions at this time.   Cindi Carbon, RN

## 2024-01-18 NOTE — Lactation Note (Signed)
This note was copied from a baby's chart. Lactation Consultation Note  Patient Name: Catherine [name] [id number] Date: 01/18/2024 Age:23 hours Reason for consult: Follow-up assessment;1st time breastfeeding;Early term 37-38.6wks  P1, Baby latched when LC entered room with intermittent swallows. Feed on demand with cues.  Goal 8-12+ times per day after first 24 hrs.  Place baby STS if not cueing.  Reviewed engorgement care and monitoring voids/stools. Encouraged offering the breast before formula to help establish mother's milk supply. Discussed if mother is offering formula she should pump in addition to stimulate her supply.   Maternal Data Does the patient have breastfeeding experience prior to this delivery?: No  Feeding Mother's Current Feeding Choice: Breast Milk and Formula  LATCH Score Latch: Grasps breast easily, tongue down, lips flanged, rhythmical sucking.  Audible Swallowing: A few with stimulation  Type of Nipple: Everted at rest and after stimulation  Comfort (Breast/Nipple): Soft / non-tender  Hold (Positioning): No assistance needed to correctly position infant at breast.  LATCH Score: 9   Lactation Tools Discussed/Used  DEBP  Interventions Interventions: Breast feeding basics reviewed;Education  Discharge Discharge Education: Engorgement and breast care;Warning signs for feeding baby Pump: DEBP;Hands Free  Consult Status Consult Status: Complete Date: 01/18/24    Hardie Pulley  RN, IBCLC 01/18/2024, 11:28 AM

## 2024-01-23 ENCOUNTER — Ambulatory Visit (INDEPENDENT_AMBULATORY_CARE_PROVIDER_SITE_OTHER): Payer: 59

## 2024-01-23 VITALS — BP 148/92 | HR 100

## 2024-01-23 DIAGNOSIS — Z0131 Encounter for examination of blood pressure with abnormal findings: Secondary | ICD-10-CM

## 2024-01-23 DIAGNOSIS — Z98891 History of uterine scar from previous surgery: Secondary | ICD-10-CM

## 2024-01-23 NOTE — Progress Notes (Signed)
 Subjective:  Catherine [name] is a 23 y.o. female here for BP check and incision check.  Hypertension ROS: taking medications as instructed, no medication side effects noted, no TIA's, no chest pain on exertion, no dyspnea on exertion, and no swelling of ankles. BP today was 148/92   Pt reports incision covered  Objective:  LMP 04/29/2023 (Exact Date)   Appearance alert, well appearing, and in no distress. Incision healing well   Assessment:   Blood Pressure today in office needs improvement.  Incision healed nicely, no signs of infection, scar looks good  Plan:  Follow up for BP check in 1 week .

## 2024-01-25 ENCOUNTER — Ambulatory Visit: Payer: 59

## 2024-01-25 ENCOUNTER — Encounter: Payer: 59 | Admitting: Obstetrics & Gynecology

## 2024-01-25 ENCOUNTER — Other Ambulatory Visit: Payer: 59

## 2024-01-30 ENCOUNTER — Ambulatory Visit (INDEPENDENT_AMBULATORY_CARE_PROVIDER_SITE_OTHER): Payer: 59

## 2024-01-30 VITALS — BP 123/82 | HR 80 | Temp 97.8°F

## 2024-01-30 DIAGNOSIS — Z0130 Encounter for examination of blood pressure without abnormal findings: Secondary | ICD-10-CM

## 2024-01-30 DIAGNOSIS — O165 Unspecified maternal hypertension, complicating the puerperium: Secondary | ICD-10-CM

## 2024-01-30 NOTE — Progress Notes (Signed)
 Subjective:  Catherine [name] is a 23 y.o. female here for BP check.  Notes HA's and Chills  Hypertension ROS: Patient denies any headaches, visual symptoms, RUQ/epigastric pain or other concerning symptoms.  Objective:  LMP 04/29/2023 (Exact Date)   Appearance alert, well appearing, and in no distress. General exam BP noted to be 123/82 P:80  today in office.    Assessment:   Blood Pressure well controlled.  Consulted with provider regarding patients complaints pt advised may be coming down with a cold and to monitor symptoms.  Discussed hypertension precautions pt advised to contact office with any concerns.   Plan:  Keep PP Appt .

## 2024-02-12 ENCOUNTER — Other Ambulatory Visit: Payer: Self-pay

## 2024-02-12 ENCOUNTER — Other Ambulatory Visit: Payer: Self-pay | Admitting: Advanced Practice Midwife

## 2024-02-13 ENCOUNTER — Other Ambulatory Visit (HOSPITAL_COMMUNITY): Payer: Self-pay

## 2024-02-13 ENCOUNTER — Other Ambulatory Visit: Payer: Self-pay

## 2024-02-16 ENCOUNTER — Encounter (HOSPITAL_COMMUNITY): Payer: Self-pay | Admitting: Pharmacist

## 2024-02-16 ENCOUNTER — Other Ambulatory Visit (HOSPITAL_COMMUNITY): Payer: Self-pay

## 2024-02-19 ENCOUNTER — Telehealth: Payer: Self-pay | Admitting: *Deleted

## 2024-02-19 NOTE — Telephone Encounter (Signed)
 RTC to patient states has been having ear pain in her left ear.  Has had some sharp head pain.  Refuses to go to the Urgent care .  No fever or sinus drainage.  Given appointment for #/19/2025 at 8:45 AM.

## 2024-02-20 NOTE — Telephone Encounter (Signed)
 Thank you, will see them soon.

## 2024-02-21 ENCOUNTER — Inpatient Hospital Stay (HOSPITAL_COMMUNITY)

## 2024-02-21 ENCOUNTER — Inpatient Hospital Stay (HOSPITAL_COMMUNITY)
Admission: AD | Admit: 2024-02-21 | Discharge: 2024-02-21 | Disposition: A | Discharge status: Home / Self Care | Attending: Obstetrics and Gynecology | Admitting: Obstetrics and Gynecology

## 2024-02-21 ENCOUNTER — Ambulatory Visit (INDEPENDENT_AMBULATORY_CARE_PROVIDER_SITE_OTHER): Admitting: Student

## 2024-02-21 ENCOUNTER — Encounter (HOSPITAL_COMMUNITY): Payer: Self-pay | Admitting: Obstetrics and Gynecology

## 2024-02-21 VITALS — BP 137/91 | HR 77 | Temp 97.8°F | Ht 62.0 in | Wt 258.8 lb

## 2024-02-21 DIAGNOSIS — R4182 Altered mental status, unspecified: Secondary | ICD-10-CM | POA: Insufficient documentation

## 2024-02-21 DIAGNOSIS — R569 Unspecified convulsions: Secondary | ICD-10-CM | POA: Diagnosis not present

## 2024-02-21 DIAGNOSIS — H538 Other visual disturbances: Secondary | ICD-10-CM | POA: Insufficient documentation

## 2024-02-21 DIAGNOSIS — O9089 Other complications of the puerperium, not elsewhere classified: Secondary | ICD-10-CM | POA: Diagnosis not present

## 2024-02-21 DIAGNOSIS — R519 Headache, unspecified: Secondary | ICD-10-CM | POA: Insufficient documentation

## 2024-02-21 DIAGNOSIS — O99893 Other specified diseases and conditions complicating puerperium: Secondary | ICD-10-CM | POA: Diagnosis not present

## 2024-02-21 LAB — COMPREHENSIVE METABOLIC PANEL
ALT: 15 U/L (ref 0–44)
AST: 21 U/L (ref 15–41)
Albumin: 3.6 g/dL (ref 3.5–5.0)
Alkaline Phosphatase: 66 U/L (ref 38–126)
Anion gap: 9 (ref 5–15)
BUN: 7 mg/dL (ref 6–20)
CO2: 25 mmol/L (ref 22–32)
Calcium: 9.9 mg/dL (ref 8.9–10.3)
Chloride: 106 mmol/L (ref 98–111)
Creatinine, Ser: 0.65 mg/dL (ref 0.44–1.00)
GFR, Estimated: >60 mL/min (ref >60)
Glucose, Bld: 88 mg/dL (ref 70–99)
Potassium: 4.3 mmol/L (ref 3.5–5.1)
Sodium: 140 mmol/L (ref 135–145)
Total Bilirubin: 0.2 mg/dL (ref 0.0–1.2)
Total Protein: 6.9 g/dL (ref 6.5–8.1)

## 2024-02-21 LAB — URINALYSIS, ROUTINE W REFLEX MICROSCOPIC
Bilirubin Urine: NEGATIVE
Glucose, UA: NEGATIVE mg/dL
Hgb urine dipstick: NEGATIVE
Ketones, ur: NEGATIVE mg/dL
Leukocytes,Ua: NEGATIVE
Nitrite: NEGATIVE
Protein, ur: NEGATIVE mg/dL
Specific Gravity, Urine: 1.008 (ref 1.005–1.030)
pH: 6 (ref 5.0–8.0)

## 2024-02-21 LAB — CBC
HCT: 42.9 % (ref 36.0–46.0)
Hemoglobin: 13.3 g/dL (ref 12.0–15.0)
MCH: 24.2 pg — ABNORMAL LOW (ref 26.0–34.0)
MCHC: 31 g/dL (ref 30.0–36.0)
MCV: 78.1 fL — ABNORMAL LOW (ref 80.0–100.0)
Platelets: 414 10*3/uL — ABNORMAL HIGH (ref 150–400)
RBC: 5.49 MIL/uL — ABNORMAL HIGH (ref 3.87–5.11)
RDW: 14.5 % (ref 11.5–15.5)
WBC: 8.7 10*3/uL (ref 4.0–10.5)
nRBC: 0 % (ref 0.0–0.2)

## 2024-02-21 LAB — D-DIMER, QUANTITATIVE: D-Dimer, Quant: 0.73 ug{FEU}/mL — ABNORMAL HIGH (ref 0.00–0.50)

## 2024-02-21 LAB — TSH: TSH: 1.01 u[IU]/mL (ref 0.350–4.500)

## 2024-02-21 MED ORDER — GADOBUTROL 1 MMOL/ML IV SOLN
10.0000 mL | Freq: Once | INTRAVENOUS | Status: AC | PRN
  Administered 2024-02-21: 10 mL via INTRAVENOUS

## 2024-02-21 MED ORDER — KETOROLAC TROMETHAMINE 60 MG/2ML IM SOLN
60.0000 mg | Freq: Once | INTRAMUSCULAR | Status: AC
  Administered 2024-02-21: 60 mg via INTRAMUSCULAR
  Filled 2024-02-21: qty 2

## 2024-02-21 NOTE — Progress Notes (Signed)
 RN called to coordinate time for EEG. Will give report to night shift for EEG to be performed ASAP. MRI is scheduled as outpatient per RN.

## 2024-02-21 NOTE — MAU Note (Signed)
 RN called EEG tech and inquired about EEG process for MAU. EEG tech informed RN that it would be best for patient to receive MRI first and then EEG, as it may take several hours. RN then called MRI to inquire about timeframe. MRI reports that they must wait 24 hours from the last time of contrast in order to do an additional scan. Dr. Lucianne Muss notified and instructed RN to go ahead and call EEG tech back to perform test and that patient's repeat MRI will likely be outpatient.

## 2024-02-21 NOTE — MAU Provider Note (Incomplete Revision)
 History     CSN: [id number]  Arrival date and time: 02/21/24 1009   Event Date/Time   First Provider Initiated Contact with Patient 02/21/2024 10:25 AM   Chief Complaint  Patient presents with   Headache   Possible Seizure    HPI  Catherine [name] is a 23 y.o. G1P1001 who is 5wk PP after pLTCS for arrest of dilation, who presents to the MAU for headache, vision changes, and reports isolated episode of altered mental status. Pt was diagnosed w gHTN postpartum, discharged with course of Lasix and Procardia 30mg , which she has been taking without issue. She had not reported any ASE to either of these meds. She reports starting 1-2 weeks ago, she started having more severe headaches, which she describes as moving around her head, but initially was radiating into her left ear. She initially thought she had an ear infection, but didn't have any other symptoms (fevers, chills, congestion), and ear pain does not always coincide with headaches. Does not report ear pain today. Describes blurred vision as well with headaches. She has a known hx of migraines, migraines were not very frequent prior to pregnancy, had not had any during pregnancy. She is unable to say for sure that if current sxs are c/w prior migraine hx.   She also describes an incident last week where she began to feel like her body was heavy and she was going to pass out. This happened around 1-2am. She told her boyfriend, who asked that she come out of the shower. She walked downstairs and was staring blankly and was not responding to her partner. Partner had not noticed any loss of consciousness, stereotyped movements, lip smacking, tonic/clonic type movements during episode. She states that she recalls being upstairs, but does not remember getting upstairs. Maybe had another episode the following day in the car, but none since.  Past Medical History:  Diagnosis Date   Allergic rhinitis 04/01/2010   Qualifier: Diagnosis of   By: Severiano Gilbert MD,  Lawson Fiscal         Allergy    Anxiety    Asthma    past hx   Blanching rash 10/19/2022   Childhood asthma, mild intermittent, uncomplicated 02/01/2007   Qualifier: Diagnosis of   By: Jeanice Lim MD, Kingsley Spittle         Depression    Family history of brain aneurysm- father died around age 60  2020-04-13   Health care maintenance 2020-04-13   Care Gaps     High risk heterosexual behavior Apr 13, 2020   History of anemia 2020-04-13   Menorrhagia with irregular cycle 04/13/2020   Migraine    Pelvic pain 04-13-20   Polyuria Apr 13, 2020   Right lower quadrant pain 2020-04-13    Past Surgical History:  Procedure Laterality Date   CESAREAN SECTION N/A 01/16/2024   Procedure: CESAREAN SECTION;  Surgeon: Catalina Antigua, MD;  Location: [facility];  Service: Obstetrics;  Laterality: N/A;   TONSILLECTOMY Bilateral 08/11/2015   Procedure: CONTROL POST TONSILLECTOMY BLEEDING ;  Surgeon: Vernie Murders, MD;  Location: [facility];  Service: ENT;  Laterality: Bilateral;   TONSILLECTOMY AND ADENOIDECTOMY N/A 07/31/2015   Procedure: TONSILLECTOMY AND ADENOIDECTOMY;  Surgeon: Linus Salmons, MD;  Location: [facility];  Service: ENT;  Laterality: N/A;   WISDOM TOOTH EXTRACTION      Family History  Problem Relation Age of Onset   Heart disease Mother    Bipolar disorder Mother    Depression Mother    Cancer Mother  Schizophrenia Mother    Asthma Father    Hypertension Father    Aneurysm Father    Hypertension Brother    Diabetes Brother    Breast cancer Maternal Grandmother    Cancer Maternal Grandmother    Lung disease Maternal Grandmother    Heart disease Maternal Grandfather    Heart attack Maternal Grandfather    Aneurysm Paternal Grandfather     Social History   Tobacco Use   Smoking status: Never    Passive exposure: Never   Smokeless tobacco: Never  Vaping Use   Vaping status: Never Used  Substance Use Topics   Alcohol use: Not Currently    Comment: once per month, 1-2 drinks    Drug use: Not Currently    Comment: last use feb 2024    Allergies:  Allergies  Allergen Reactions   Amoxicillin Hives    Medications Prior to Admission  Medication Sig Dispense Refill Last Dose/Taking   ibuprofen (ADVIL) 600 MG tablet Take 1 tablet (600 mg total) by mouth every 6 (six) hours as needed. 30 tablet 0 Past Week   NIFEdipine (ADALAT CC) 30 MG 24 hr tablet Take 1 tablet (30 mg total) by mouth daily. 30 tablet 2 02/21/2024 at  6:30 AM   ferrous sulfate 325 (65 FE) MG tablet Take 1 tablet (325 mg total) by mouth every other day. 30 tablet 3    furosemide (LASIX) 20 MG tablet Take 1 tablet (20 mg total) by mouth daily. (Patient not taking: Reported on 02/21/2024) 5 tablet 0 Not Taking   oxyCODONE (OXY IR/ROXICODONE) 5 MG immediate release tablet Take 1-2 tablets (5-10 mg total) by mouth every 4 (four) hours as needed for moderate pain (pain score 4-6). 30 tablet 0    prenatal vitamin w/FE, FA (PRENATAL 1 + 1) 27-1 MG TABS tablet Take 1 tablet by mouth daily at 12 noon.       ROS reviewed and pertinent positives and negatives as documented in HPI.  Physical Exam   Blood pressure 121/82, pulse 85, temperature 98.2 F (36.8 C), temperature source Oral, resp. rate 16, height 5\' 3"  (1.6 m), weight 116.1 kg, SpO2 100%, not currently breastfeeding.  Physical Exam Constitutional:      General: She is not in acute distress.    Appearance: Normal appearance.  HENT:     Head: Normocephalic and atraumatic.  Cardiovascular:     Rate and Rhythm: Normal rate and regular rhythm.     Heart sounds: Normal heart sounds.  Pulmonary:     Effort: Pulmonary effort is normal.     Breath sounds: Normal breath sounds.  Abdominal:     General: There is no distension.     Palpations: Abdomen is soft.     Tenderness: There is no abdominal tenderness. There is no right CVA tenderness or left CVA tenderness.  Musculoskeletal:        General: Normal range of motion.  Skin:    General: Skin is  warm and dry.     Findings: No rash.  Neurological:     General: No focal deficit present.     Mental Status: She is alert and oriented to person, place, and time. Mental status is at baseline.     Cranial Nerves: No cranial nerve deficit or dysarthria.     Sensory: Sensory deficit (decreased sensation of left side of face and left upper extremity) present.     Motor: No weakness.     Coordination: Coordination normal.  Deep Tendon Reflexes: Reflexes normal.  Psychiatric:        Mood and Affect: Mood normal.        Behavior: Behavior normal.     MAU Course  Procedures  MDM 22 y.o. G1P1001 who is 5 weeks postpartum reporting persistent headache, vision changes, and two isolated episodes of altered mentation. She is normotensive and her neurologic exam is notable for reported decreased sensation of left side of face and left upper extremity. The remainder of her neurologic exam is unremarkable. Given her reported altered mentation, elevated BP (in clinic earlier today and on arrival to MAU), will proceed with PEC w/up and obtain imaging to further evaluate headache, altered mentation, and decreased left sided sensation. Will give a dose of Toradol to tx headache. Will consider neuro consult pending results of w/up.   1428 MRI/MRA results back -- unremarkable MRI, no LVO or high-grade arterial stenosis of intracranial vessels, carotids, internal carotids, or vertebral arteries. Spoke with neurologist, Dr. Otelia Limes, who recommends proceeding with MRV and EEG. High suspicion this may be psychogenic, but will r/o other etiologies.  1536 Discussed update in plan of care w pt. Awaiting MRV and EEG. Per MRI tech, MRV unable to be completed given pt has received contrast already today. Will await EEG -- if this is unremarkable, could consider outpatient MRV as previously planned.  2047 EEG complete. Discussed imaging with neuroradiologist, Dr. Phill Myron, who upon review of images, does not see  any evidence concerning for central venous sinus thrombosis, however definitive test would be MRV. Recommends MRV w/o contrast, which was ordered.  2155 Patient care handed over to Wynelle Bourgeois, CNM.   Sundra Aland, MD OB Fellow, [facility], [facility]   Assessment and Plan

## 2024-02-21 NOTE — MAU Note (Signed)
 RN called lab to inquire about adding TSH onto previously drawn CMP. Lab tech located sample and informed RN that they would add it on.

## 2024-02-21 NOTE — MAU Provider Note (Cosign Needed Addendum)
 History     CSN: [id number]  Arrival date and time: 02/21/24 1009   Event Date/Time   First Provider Initiated Contact with Patient 02/21/2024 10:25 AM   Chief Complaint  Patient presents with   Headache   Possible Seizure    HPI  Catherine [name] is a 23 y.o. G1P1001 who is 5wk PP after pLTCS for arrest of dilation, who presents to the MAU for headache, vision changes, and reports isolated episode of altered mental status. Pt was diagnosed w gHTN postpartum, discharged with course of Lasix and Procardia 30mg , which she has been taking without issue. She had not reported any ASE to either of these meds. She reports starting 1-2 weeks ago, she started having more severe headaches, which she describes as moving around her head, but initially was radiating into her left ear. She initially thought she had an ear infection, but didn't have any other symptoms (fevers, chills, congestion), and ear pain does not always coincide with headaches. Does not report ear pain today. Describes blurred vision as well with headaches. She has a known hx of migraines, migraines were not very frequent prior to pregnancy, had not had any during pregnancy. She is unable to say for sure that if current sxs are c/w prior migraine hx.   She also describes an incident last week where she began to feel like her body was heavy and she was going to pass out. This happened around 1-2am. She told her boyfriend, who asked that she come out of the shower. She walked downstairs and was staring blankly and was not responding to her partner. Partner had not noticed any loss of consciousness, stereotyped movements, lip smacking, tonic/clonic type movements during episode. She states that she recalls being upstairs, but does not remember getting upstairs. Maybe had another episode the following day in the car, but none since.  Past Medical History:  Diagnosis Date   Allergic rhinitis 04/01/2010   Qualifier: Diagnosis of   By: Severiano Gilbert MD,  Lawson Fiscal         Allergy    Anxiety    Asthma    past hx   Blanching rash 10/19/2022   Childhood asthma, mild intermittent, uncomplicated 02/01/2007   Qualifier: Diagnosis of   By: Jeanice Lim MD, Kingsley Spittle         Depression    Family history of brain aneurysm- father died around age 62  2020-04-28   Health care maintenance 04-28-2020   Care Gaps     High risk heterosexual behavior 04-28-2020   History of anemia Apr 28, 2020   Menorrhagia with irregular cycle Apr 28, 2020   Migraine    Pelvic pain Apr 28, 2020   Polyuria 2020-04-28   Right lower quadrant pain 04-28-20    Past Surgical History:  Procedure Laterality Date   CESAREAN SECTION N/A 01/16/2024   Procedure: CESAREAN SECTION;  Surgeon: Catalina Antigua, MD;  Location: [facility];  Service: Obstetrics;  Laterality: N/A;   TONSILLECTOMY Bilateral 08/11/2015   Procedure: CONTROL POST TONSILLECTOMY BLEEDING ;  Surgeon: Vernie Murders, MD;  Location: [facility];  Service: ENT;  Laterality: Bilateral;   TONSILLECTOMY AND ADENOIDECTOMY N/A 07/31/2015   Procedure: TONSILLECTOMY AND ADENOIDECTOMY;  Surgeon: Linus Salmons, MD;  Location: [facility];  Service: ENT;  Laterality: N/A;   WISDOM TOOTH EXTRACTION      Family History  Problem Relation Age of Onset   Heart disease Mother    Bipolar disorder Mother    Depression Mother    Cancer Mother  Schizophrenia Mother    Asthma Father    Hypertension Father    Aneurysm Father    Hypertension Brother    Diabetes Brother    Breast cancer Maternal Grandmother    Cancer Maternal Grandmother    Lung disease Maternal Grandmother    Heart disease Maternal Grandfather    Heart attack Maternal Grandfather    Aneurysm Paternal Grandfather     Social History   Tobacco Use   Smoking status: Never    Passive exposure: Never   Smokeless tobacco: Never  Vaping Use   Vaping status: Never Used  Substance Use Topics   Alcohol use: Not Currently    Comment: once per month, 1-2 drinks    Drug use: Not Currently    Comment: last use feb 2024    Allergies:  Allergies  Allergen Reactions   Amoxicillin Hives    Medications Prior to Admission  Medication Sig Dispense Refill Last Dose/Taking   ibuprofen (ADVIL) 600 MG tablet Take 1 tablet (600 mg total) by mouth every 6 (six) hours as needed. 30 tablet 0 Past Week   NIFEdipine (ADALAT CC) 30 MG 24 hr tablet Take 1 tablet (30 mg total) by mouth daily. 30 tablet 2 02/21/2024 at  6:30 AM   ferrous sulfate 325 (65 FE) MG tablet Take 1 tablet (325 mg total) by mouth every other day. 30 tablet 3    furosemide (LASIX) 20 MG tablet Take 1 tablet (20 mg total) by mouth daily. (Patient not taking: Reported on 02/21/2024) 5 tablet 0 Not Taking   oxyCODONE (OXY IR/ROXICODONE) 5 MG immediate release tablet Take 1-2 tablets (5-10 mg total) by mouth every 4 (four) hours as needed for moderate pain (pain score 4-6). 30 tablet 0    prenatal vitamin w/FE, FA (PRENATAL 1 + 1) 27-1 MG TABS tablet Take 1 tablet by mouth daily at 12 noon.       ROS reviewed and pertinent positives and negatives as documented in HPI.  Physical Exam   Blood pressure 121/82, pulse 85, temperature 98.2 F (36.8 C), temperature source Oral, resp. rate 16, height 5\' 3"  (1.6 m), weight 116.1 kg, SpO2 100%, not currently breastfeeding.  Physical Exam Constitutional:      General: She is not in acute distress.    Appearance: Normal appearance.  HENT:     Head: Normocephalic and atraumatic.  Cardiovascular:     Rate and Rhythm: Normal rate and regular rhythm.     Heart sounds: Normal heart sounds.  Pulmonary:     Effort: Pulmonary effort is normal.     Breath sounds: Normal breath sounds.  Abdominal:     General: There is no distension.     Palpations: Abdomen is soft.     Tenderness: There is no abdominal tenderness. There is no right CVA tenderness or left CVA tenderness.  Musculoskeletal:        General: Normal range of motion.  Skin:    General: Skin is  warm and dry.     Findings: No rash.  Neurological:     General: No focal deficit present.     Mental Status: She is alert and oriented to person, place, and time. Mental status is at baseline.     Cranial Nerves: No cranial nerve deficit or dysarthria.     Sensory: Sensory deficit (decreased sensation of left side of face and left upper extremity) present.     Motor: No weakness.     Coordination: Coordination normal.  Deep Tendon Reflexes: Reflexes normal.  Psychiatric:        Mood and Affect: Mood normal.        Behavior: Behavior normal.     MAU Course  Procedures  MDM 22 y.o. G1P1001 who is 5 weeks postpartum reporting persistent headache, vision changes, and two isolated episodes of altered mentation. She is normotensive and her neurologic exam is notable for reported decreased sensation of left side of face and left upper extremity. The remainder of her neurologic exam is unremarkable. Given her reported altered mentation, elevated BP (in clinic earlier today and on arrival to MAU), will proceed with PEC w/up and obtain imaging to further evaluate headache, altered mentation, and decreased left sided sensation. Will give a dose of Toradol to tx headache. Will consider neuro consult pending results of w/up.   1428 MRI/MRA results back -- unremarkable MRI, no LVO or high-grade arterial stenosis of intracranial vessels, carotids, internal carotids, or vertebral arteries. Spoke with neurologist, Dr. Otelia Limes, who recommends proceeding with MRV and EEG. High suspicion this may be psychogenic, but will r/o other etiologies.  1536 Discussed update in plan of care w pt. Awaiting MRV and EEG. Per MRI tech, MRV unable to be completed given pt has received contrast already today. Will await EEG -- if this is unremarkable, could consider outpatient MRV as previously planned.  2047 EEG complete. Discussed imaging with neuroradiologist, Dr. Phill Myron, who upon review of images, does not see  any evidence concerning for central venous sinus thrombosis, however definitive test would be MRV. Recommends MRV w/o contrast, which was ordered.  2155 Patient care handed over to Wynelle Bourgeois, CNM.   Sundra Aland, MD OB Fellow, Faculty Practice [facility]   MR Venogram Head Result Date: 02/21/2024 CLINICAL DATA:  Initial evaluation for unremitting headache, recent postpartum. EXAM: MR VENOGRAM HEAD WITHOUT CONTRAST TECHNIQUE: Angiographic images of the intracranial venous structures were acquired using MRV technique without intravenous contrast. COMPARISON:  Comparison made with additional MRIs from earlier the same day. FINDINGS: Normal flow related signal seen throughout the superior sagittal sinus to the torcula. Transverse and sigmoid sinuses are patent as are the jugular bulbs and visualized proximal internal jugular veins. Right transverse sinus slightly dominant. Straight sinus, vein of Galen, internal cerebral veins, and basal veins of Rosenthal are patent. No evidence for dural venous sinus thrombosis. No dural venous sinus stenosis. No appreciable cortical vein abnormality. IMPRESSION: Normal intracranial MRV. No evidence for dural venous sinus thrombosis. Electronically Signed   By: Rise Mu M.D.   On: 02/21/2024 22:32   MR ANGIO HEAD WO CONTRAST Result Date: 02/21/2024 CLINICAL DATA:  Provided history: Headache, new or worsening. Additional history obtained from electronic MEDICAL RECORD NUMBERPatient recently postpartum (pregnancy complicated by preeclampsia/hypertension). The patient reports dizzy spells, blurred vision, left-sided hip pain radiating to left ear, possible seizure one week ago. EXAM: MRI HEAD WITHOUT CONTRAST MRA HEAD WITHOUT CONTRAST MRA NECK WITHOUT AND WITH CONTRAST TECHNIQUE: Multiplanar, multi-echo pulse sequences of the brain and surrounding structures were acquired without intravenous contrast. Angiographic images of the  Circle of Willis were acquired using MRA technique without intravenous contrast. Angiographic images of the neck were acquired using MRA technique without and with intravenous contrast. Carotid stenosis measurements (when applicable) are obtained utilizing NASCET criteria, using the distal internal carotid diameter as the denominator. CONTRAST:  10mL GADAVIST GADOBUTROL 1 MMOL/ML IV SOLN COMPARISON:  Head CT 07/08/2018. FINDINGS: MRI HEAD FINDINGS Brain: Cerebral volume is normal. No cortical  encephalomalacia is identified. No significant cerebral white matter disease. There is no acute infarct. No evidence of an intracranial mass. No chronic intracranial blood products. No extra-axial fluid collection. No midline shift. No pathologic intracranial enhancement identified. Vascular: Maintained flow voids within the proximal large arterial vessels. Skull and upper cervical spine: No focal worrisome marrow lesion. Sinuses/Orbits: No mass or acute finding within the imaged orbits. No significant paranasal sinus disease. MRA HEAD FINDINGS Anterior circulation: The intracranial internal carotid arteries are patent. The M1 middle cerebral arteries are patent. Asymmetrically early branching of the left middle cerebral artery M1 segment. No M2 proximal branch occlusion or high-grade proximal stenosis. The anterior cerebral arteries are patent. No intracranial aneurysm is identified. Posterior circulation: The intracranial vertebral arteries are patent. The basilar artery is patent. The posterior cerebral arteries are patent. Posterior communicating arteries are diminutive or absent, bilaterally. Anatomic variants: As described. MRA NECK FINDINGS Aortic arch: The visualized thoracic aorta is normal in caliber. Standard aortic branching. No hemodynamically significant innominate or proximal subclavian artery stenosis. Right carotid system: CCA and ICA patent within the neck without stenosis. Left carotid system: CCA and ICA  patent the neck without stenosis. Vertebral arteries: Codominant and patent within the neck without stenosis. IMPRESSION: MRI brain: Unremarkable MRI appearance of the brain. No evidence of an acute intracranial abnormality. MRA head: No proximal intracranial large vessel occlusion or high-grade proximal arterial stenosis. MRA neck: The common carotid, internal carotid and vertebral arteries are patent within the neck without stenosis. Electronically Signed   By: Jackey Loge D.O.   On: 02/21/2024 13:57   MR BRAIN W WO CONTRAST Result Date: 02/21/2024 CLINICAL DATA:  Provided history: Headache, new or worsening. Additional history obtained from electronic MEDICAL RECORD NUMBERPatient recently postpartum (pregnancy complicated by preeclampsia/hypertension). The patient reports dizzy spells, blurred vision, left-sided hip pain radiating to left ear, possible seizure one week ago. EXAM: MRI HEAD WITHOUT CONTRAST MRA HEAD WITHOUT CONTRAST MRA NECK WITHOUT AND WITH CONTRAST TECHNIQUE: Multiplanar, multi-echo pulse sequences of the brain and surrounding structures were acquired without intravenous contrast. Angiographic images of the Circle of Willis were acquired using MRA technique without intravenous contrast. Angiographic images of the neck were acquired using MRA technique without and with intravenous contrast. Carotid stenosis measurements (when applicable) are obtained utilizing NASCET criteria, using the distal internal carotid diameter as the denominator. CONTRAST:  10mL GADAVIST GADOBUTROL 1 MMOL/ML IV SOLN COMPARISON:  Head CT 07/08/2018. FINDINGS: MRI HEAD FINDINGS Brain: Cerebral volume is normal. No cortical encephalomalacia is identified. No significant cerebral white matter disease. There is no acute infarct. No evidence of an intracranial mass. No chronic intracranial blood products. No extra-axial fluid collection. No midline shift. No pathologic intracranial enhancement identified. Vascular: Maintained  flow voids within the proximal large arterial vessels. Skull and upper cervical spine: No focal worrisome marrow lesion. Sinuses/Orbits: No mass or acute finding within the imaged orbits. No significant paranasal sinus disease. MRA HEAD FINDINGS Anterior circulation: The intracranial internal carotid arteries are patent. The M1 middle cerebral arteries are patent. Asymmetrically early branching of the left middle cerebral artery M1 segment. No M2 proximal branch occlusion or high-grade proximal stenosis. The anterior cerebral arteries are patent. No intracranial aneurysm is identified. Posterior circulation: The intracranial vertebral arteries are patent. The basilar artery is patent. The posterior cerebral arteries are patent. Posterior communicating arteries are diminutive or absent, bilaterally. Anatomic variants: As described. MRA NECK FINDINGS Aortic arch: The visualized thoracic aorta is normal in caliber. Standard aortic branching. No  hemodynamically significant innominate or proximal subclavian artery stenosis. Right carotid system: CCA and ICA patent within the neck without stenosis. Left carotid system: CCA and ICA patent the neck without stenosis. Vertebral arteries: Codominant and patent within the neck without stenosis. IMPRESSION: MRI brain: Unremarkable MRI appearance of the brain. No evidence of an acute intracranial abnormality. MRA head: No proximal intracranial large vessel occlusion or high-grade proximal arterial stenosis. MRA neck: The common carotid, internal carotid and vertebral arteries are patent within the neck without stenosis. Electronically Signed   By: Jackey Loge D.O.   On: 02/21/2024 13:57   MR ANGIO NECK W WO CONTRAST Result Date: 02/21/2024 CLINICAL DATA:  Provided history: Headache, new or worsening. Additional history obtained from electronic MEDICAL RECORD NUMBERPatient recently postpartum (pregnancy complicated by preeclampsia/hypertension). The patient reports dizzy spells,  blurred vision, left-sided hip pain radiating to left ear, possible seizure one week ago. EXAM: MRI HEAD WITHOUT CONTRAST MRA HEAD WITHOUT CONTRAST MRA NECK WITHOUT AND WITH CONTRAST TECHNIQUE: Multiplanar, multi-echo pulse sequences of the brain and surrounding structures were acquired without intravenous contrast. Angiographic images of the Circle of Willis were acquired using MRA technique without intravenous contrast. Angiographic images of the neck were acquired using MRA technique without and with intravenous contrast. Carotid stenosis measurements (when applicable) are obtained utilizing NASCET criteria, using the distal internal carotid diameter as the denominator. CONTRAST:  10mL GADAVIST GADOBUTROL 1 MMOL/ML IV SOLN COMPARISON:  Head CT 07/08/2018. FINDINGS: MRI HEAD FINDINGS Brain: Cerebral volume is normal. No cortical encephalomalacia is identified. No significant cerebral white matter disease. There is no acute infarct. No evidence of an intracranial mass. No chronic intracranial blood products. No extra-axial fluid collection. No midline shift. No pathologic intracranial enhancement identified. Vascular: Maintained flow voids within the proximal large arterial vessels. Skull and upper cervical spine: No focal worrisome marrow lesion. Sinuses/Orbits: No mass or acute finding within the imaged orbits. No significant paranasal sinus disease. MRA HEAD FINDINGS Anterior circulation: The intracranial internal carotid arteries are patent. The M1 middle cerebral arteries are patent. Asymmetrically early branching of the left middle cerebral artery M1 segment. No M2 proximal branch occlusion or high-grade proximal stenosis. The anterior cerebral arteries are patent. No intracranial aneurysm is identified. Posterior circulation: The intracranial vertebral arteries are patent. The basilar artery is patent. The posterior cerebral arteries are patent. Posterior communicating arteries are diminutive or absent,  bilaterally. Anatomic variants: As described. MRA NECK FINDINGS Aortic arch: The visualized thoracic aorta is normal in caliber. Standard aortic branching. No hemodynamically significant innominate or proximal subclavian artery stenosis. Right carotid system: CCA and ICA patent within the neck without stenosis. Left carotid system: CCA and ICA patent the neck without stenosis. Vertebral arteries: Codominant and patent within the neck without stenosis. IMPRESSION: MRI brain: Unremarkable MRI appearance of the brain. No evidence of an acute intracranial abnormality. MRA head: No proximal intracranial large vessel occlusion or high-grade proximal arterial stenosis. MRA neck: The common carotid, internal carotid and vertebral arteries are patent within the neck without stenosis. Electronically Signed   By: Jackey Loge D.O.   On: 02/21/2024 13:57   Discussed negative MRI results Patient states she feels better  Assessment and Plan  A;  Postpartum x 5 weeks      Headache, likely migraine, now improved  P:  Discharge home       Discussed referral to migraine specialist       Pt to call office to get referral       Encouraged  to return if she develops worsening of symptoms, increase in pain, fever, or other concerning symptoms.   Aviva Signs, CNM

## 2024-02-21 NOTE — Patient Instructions (Addendum)
 Thank you, [name] for allowing [place] to provide your care today.  Given your medical history of recent delivery, preeclampsia, headaches, vision changes, and possible seizure within the last week we have decided it is best that you at the MAU for additional evaluation.  These may be post partum complications that would require urgent attention.  At the MAU they would likely reevaluate you and run additional studies.      We look forward to seeing you next time. Please call our clinic at [phone] if you have any questions or concerns. The best time to call is Monday-Friday from 9am-4pm, but there is someone available 24/7. If after hours or the weekend, call the main hospital number and ask for the Internal Medicine Resident On-Call. If you need medication refills, please notify your pharmacy one week in advance and they will send [place] a request.   Thank you for trusting me with your care. Wishing you the best!  Lovie Macadamia MD [facility]

## 2024-02-21 NOTE — MAU Note (Signed)
.  SEQUOYAH RAMONE is a 23 y.o. at Unknown here in MAU reporting: HA, dizzy spells, blurred vision, questionable seizure one week ago, and left sided pain in head that radiates to her left ear. She reports for the potential seizure she had gotten out of the shower and felt as if she was going to pass out. She reports she then went downstairs and reports her boyfriend states she was standing still and was "blankly staring and wouldn't talk." She reports the next thing she remembers was being upstairs but has no recollection of how she got upstairs. She reports her boyfriend states he "woke me up and walked me upstairs." Reports blurred vision when her HA's occur.  Last took 30 mg Nifedipine around 0630 this morning.  Onset of complaint: 1-2 weeks Pain score:  7/10 HA - woke up with it  Vitals:   02/21/24 1025  BP: (!) 135/94  Pulse: 87  Resp: 16  Temp: 98.2 F (36.8 C)  SpO2: 100%      Lab orders placed from triage: none

## 2024-02-21 NOTE — Progress Notes (Signed)
 EEG complete - results pending

## 2024-02-21 NOTE — MAU Note (Signed)
 1911- EEG team at bedside

## 2024-02-21 NOTE — MAU Note (Signed)
 Patient returned from MRI.

## 2024-02-21 NOTE — MAU Note (Signed)
 This RN called Reading Room to inquire ETA of MR Venogram being read; per Radiology tech will get assigned to neurologist to be read

## 2024-02-21 NOTE — MAU Note (Signed)
 Radiology tech called RN and informed that they would be sending for the patient soon.

## 2024-02-21 NOTE — Assessment & Plan Note (Addendum)
 This is a 23 year old female who recently delivered about 1 month ago.  Her pregnancy was complicated by preeclampsia and hypertension.  The patient states over the last 2 weeks she has had left-sided head and ear pain.  She says that these episodes are associated with blurry vision and sometimes make her eyes water.  About 1 week ago the patient endorses an episode of altered mental status where she stared off into space and was unresponsive.  She felt confused after this episode.  Her boyfriend witnessed this episode and was the one who made her aware of this.  On exam today, she does endorse decreased sensation to the left side of the face, but otherwise her neuroexam is relatively nonfocal.  Blood pressure is increased to 137/91 today, she states she has taken her nifedipine this morning.  Given her recent delivery, OB history, headaches, and seizure-like episodes last week, I reached out to her obstetrician who stated it would be best for her to be evaluated at the MAU today.  I did attempt to arrange a MR venogram, but with insurance authorization this will take some time to obtain.  This may be related to her known history of migraine, but would want to rule out alternative pathology such as eclampsia and dural venous sinus thrombosis.  This plan was discussed with the patient, and they were sent to the MAU for further evaluation and workup.

## 2024-02-21 NOTE — MAU Note (Signed)
 Patient transport at bedside to take patient to MRI via wheelchair.

## 2024-02-21 NOTE — Progress Notes (Signed)
 Subjective:  CC: Headache  HPI:  Catherine [name] is a 23 y.o. person with a past medical history stated below and presents today for the stated chief complaint. Please see problem based assessment and plan for additional details.  Past Medical History:  Diagnosis Date   Allergic rhinitis 04/01/2010   Qualifier: Diagnosis of   By: Severiano Gilbert MD, Lawson Fiscal         Allergy    Anxiety    Asthma    past hx   Blanching rash 10/19/2022   Childhood asthma, mild intermittent, uncomplicated 02/01/2007   Qualifier: Diagnosis of   By: Jeanice Lim MD, Kingsley Spittle         Depression    Family history of brain aneurysm- father died around age 83  Apr 05, 2020   Health care maintenance 04/05/2020   Care Gaps     High risk heterosexual behavior 04/05/20   History of anemia 05-Apr-2020   Menorrhagia with irregular cycle 04-05-2020   Migraine    Pelvic pain Apr 05, 2020   Polyuria 04/05/2020   Right lower quadrant pain 2020/04/05    Current Outpatient Medications on File Prior to Visit  Medication Sig Dispense Refill   ferrous sulfate 325 (65 FE) MG tablet Take 1 tablet (325 mg total) by mouth every other day. 30 tablet 3   furosemide (LASIX) 20 MG tablet Take 1 tablet (20 mg total) by mouth daily. 5 tablet 0   ibuprofen (ADVIL) 600 MG tablet Take 1 tablet (600 mg total) by mouth every 6 (six) hours as needed. 30 tablet 0   NIFEdipine (ADALAT CC) 30 MG 24 hr tablet Take 1 tablet (30 mg total) by mouth daily. 30 tablet 2   oxyCODONE (OXY IR/ROXICODONE) 5 MG immediate release tablet Take 1-2 tablets (5-10 mg total) by mouth every 4 (four) hours as needed for moderate pain (pain score 4-6). 30 tablet 0   prenatal vitamin w/FE, FA (PRENATAL 1 + 1) 27-1 MG TABS tablet Take 1 tablet by mouth daily at 12 noon.     No current facility-administered medications on file prior to visit.    Review of Systems: Please see assessment and plan for pertinent positives and negatives.  Objective:   Vitals:   02/21/24 0845  02/21/24 0914  BP: (!) 133/94 (!) 137/91  Pulse: 88 77  Temp: 97.8 F (36.6 C)   TempSrc: Oral   SpO2: 99%   Weight: 258 lb 12.8 oz (117.4 kg)   Height: 5\' 2"  (1.575 m)     Physical Exam: Constitutional: Well-appearing Cardiovascular: Regular rate and rhythm Pulmonary/Chest: lungs clear to auscultation bilaterally Extremities: No edema of the lower extremities bilaterally Psych: Pleasant affect Thought process is linear and is goal-directed. Neuro: Decreased sensation of the left side of the face.   Assessment & Plan:  Headache in pregnancy, antepartum This is a 23 year old female who recently delivered about 1 month ago.  Her pregnancy was complicated by preeclampsia and hypertension.  The patient states over the last 2 weeks she has had left-sided head and ear pain.  She says that these episodes are associated with blurry vision and sometimes make her eyes water.  About 1 week ago the patient endorses an episode of altered mental status where she stared off into space and was unresponsive.  She felt confused after this episode.  Her boyfriend witnessed this episode and was the one who made her aware of this.  On exam today, she does endorse decreased sensation to the left side of  the face, but otherwise her neuroexam is relatively nonfocal.  Blood pressure is increased to 137/91 today, she states she has taken her nifedipine this morning.  Given her recent delivery, OB history, headaches, and seizure-like episodes last week, I reached out to her obstetrician who stated it would be best for her to be evaluated at the MAU today.  I did attempt to arrange a MR venogram, but with insurance authorization this will take some time to obtain.  This may be related to her known history of migraine, but would want to rule out alternative pathology such as eclampsia and dural venous sinus thrombosis.  This plan was discussed with the patient, and they were sent to the MAU for further  evaluation and workup.   Patient discussed with Dr. Cleda Daub  Lovie Macadamia MD [facility]  PGY-1 Pager: [phone]  Phone: [phone] Date 02/21/2024  Time 10:30 AM

## 2024-02-22 NOTE — Procedures (Signed)
 Patient Name: Catherine [name]  MRN: [id number]  Epilepsy Attending: Charlsie Quest  Referring Physician/Provider: Caryl Pina, MD  Date: 02/21/2024 Duration: 33.25 mins  Patient history: 22yo  female patient endorses an episode of altered mental status where she stared off into space and was unresponsive. EEG to evaluate for seizure  Level of alertness: Awake  AEDs during EEG study: None  Technical aspects: This EEG study was done with scalp electrodes positioned according to the 10-20 International system of electrode placement. Electrical activity was reviewed with band pass filter of 1-70Hz , sensitivity of 7 uV/mm, display speed of 66mm/sec with a 60Hz  notched filter applied as appropriate. EEG data were recorded continuously and digitally stored.  Video monitoring was available and reviewed as appropriate.  Description: The posterior dominant rhythm consists of 8 Hz activity of moderate voltage (25-35 uV) seen predominantly in posterior head regions, symmetric and reactive to eye opening and eye closing. Hyperventilation did not show any EEG change.  Physiologic photic driving was not seen during photic stimulation.    IMPRESSION: This study is within normal limits. No seizures or epileptiform discharges were seen throughout the recording.  A normal interictal EEG does not exclude the diagnosis of epilepsy.  Ford Peddie Annabelle Harman

## 2024-02-27 ENCOUNTER — Ambulatory Visit: Payer: 59 | Admitting: Obstetrics and Gynecology

## 2024-02-29 ENCOUNTER — Encounter: Payer: Self-pay | Admitting: Obstetrics and Gynecology

## 2024-02-29 ENCOUNTER — Ambulatory Visit (INDEPENDENT_AMBULATORY_CARE_PROVIDER_SITE_OTHER): Admitting: Obstetrics and Gynecology

## 2024-02-29 VITALS — BP 121/83 | HR 80 | Wt 255.0 lb

## 2024-02-29 DIAGNOSIS — Z309 Encounter for contraceptive management, unspecified: Secondary | ICD-10-CM | POA: Diagnosis not present

## 2024-02-29 DIAGNOSIS — Z3202 Encounter for pregnancy test, result negative: Secondary | ICD-10-CM | POA: Diagnosis not present

## 2024-02-29 DIAGNOSIS — N926 Irregular menstruation, unspecified: Secondary | ICD-10-CM | POA: Insufficient documentation

## 2024-02-29 DIAGNOSIS — Z1339 Encounter for screening examination for other mental health and behavioral disorders: Secondary | ICD-10-CM | POA: Diagnosis not present

## 2024-02-29 LAB — POCT URINE PREGNANCY: Preg Test, Ur: NEGATIVE

## 2024-02-29 MED ORDER — BUTALBITAL-APAP-CAFFEINE 50-325-40 MG PO CAPS: 1.0000 | ORAL_CAPSULE | Freq: Every day | ORAL | 0 refills | Status: DC | PRN

## 2024-02-29 MED ORDER — BUTALBITAL-APAP-CAFFEINE 50-325-40 MG PO CAPS
1.0000 | ORAL_CAPSULE | Freq: Every day | ORAL | 0 refills | Status: DC | PRN
Start: 2024-02-29 — End: 2024-06-04

## 2024-02-29 NOTE — Progress Notes (Signed)
    Post Partum Visit Note  Catherine [name] is a 23 y.o. G1P1001 2/11 PLTCS at 37wks due to failed GHTN IOL (arrest of dilation).    Anesthesia: spinal. Postpartum course c/b acute on chronic migraines. Patient most recently seen in the MAU a week ago and had a negative EEG for ?seizure like activity with normal BPs on procardia, negative head MRIs and lab work up. Patient states she continues on the procardia and HAs still present; she does not have a neurologist.  Catherine [name] is doing well. Baby is feeding by bottle - [place] . Bleeding no bleeding. Bowel function is normal. Bladder function is normal. Patient is sexually active. Contraception method is condoms.Wants to discuss pills.  Postpartum depression screening: negative.    Edinburgh Postnatal Depression Scale - 02/29/24 1054       Edinburgh Postnatal Depression Scale:  In the Past 7 Days   I have been able to laugh and see the funny side of things. 0    I have looked forward with enjoyment to things. 0    I have blamed myself unnecessarily when things went wrong. 1    I have been anxious or worried for no good reason. 2    I have felt scared or panicky for no good reason. 1    Things have been getting on top of me. 1    I have been so unhappy that I have had difficulty sleeping. 0    I have felt sad or miserable. 0    I have been so unhappy that I have been crying. 0    The thought of harming myself has occurred to me. 0    Edinburgh Postnatal Depression Scale Total 5            Review of Systems Pertinent items are noted in HPI.  Objective:  BP 121/83   Pulse 80   Wt 255 lb (115.7 kg)   Breastfeeding No   BMI 45.17 kg/m    General: NAD Abdomen: benign, well healed incision  Assessment:  Patient stable  Plan:  *PP: patient breastfed for about a month and no period but states she has a h/o irregular periods usually about q49m pre pregnancy. She states she has never had a work up or been told she has PCOS. D/w her  re: RTC in 4-6wks for an annual and can talk to her about this, potential work up and birth control/regulating her cycles.  -pap neg 02/03/23 *HAs in pregnancy: patient believes it's related to the procardia which I suspect as well. Will d/c it and RTC 1 wk for BP check. Pt only drinks one 12oz caffeine drink per day and is gettting around 7h of sleep per night. 3 fioricet PRN given to see if this can help. May need to refer to Neuro if s/s not improved at 1wk BP check.   Catherine Bing, MD [facility] [employer], [facility]

## 2024-02-29 NOTE — Addendum Note (Signed)
 Addended by: [place] Bing on: 02/29/2024 02:20 PM   Modules accepted: Orders

## 2024-03-01 NOTE — Addendum Note (Signed)
 Addended by: Gust Rung on: 03/01/2024 09:23 PM   Modules accepted: Level of Service

## 2024-03-01 NOTE — Progress Notes (Signed)
 Internal Medicine Clinic Attending  Case discussed with the resident at the time of the visit.  We reviewed the resident's history and exam and pertinent patient test results.  I agree with the assessment, diagnosis, and plan of care documented in the resident's note.  As noted directed to ED/MAU

## 2024-03-07 ENCOUNTER — Ambulatory Visit (INDEPENDENT_AMBULATORY_CARE_PROVIDER_SITE_OTHER)

## 2024-03-07 VITALS — BP 126/84 | HR 51 | Wt 259.0 lb

## 2024-03-07 DIAGNOSIS — Z1339 Encounter for screening examination for other mental health and behavioral disorders: Secondary | ICD-10-CM

## 2024-03-07 DIAGNOSIS — O9089 Other complications of the puerperium, not elsewhere classified: Secondary | ICD-10-CM

## 2024-03-07 DIAGNOSIS — Z0130 Encounter for examination of blood pressure without abnormal findings: Secondary | ICD-10-CM

## 2024-03-07 DIAGNOSIS — O165 Unspecified maternal hypertension, complicating the puerperium: Secondary | ICD-10-CM

## 2024-03-07 NOTE — Progress Notes (Addendum)
 Subjective:  Catherine [name] is a 23 y.o. female here for BP check.   Hypertension ROS: Patient denies any headaches, visual symptoms, RUQ/epigastric pain or other concerning symptoms. Pt states HA's have improved.  Objective:  BP 126/84   Pulse (!) 51   Wt 259 lb (117.5 kg)   LMP 03/04/2024   Breastfeeding No   BMI 45.88 kg/m   Appearance alert, well appearing, and in no distress. General exam BP noted to be 126/84 today in office.    Assessment:   Blood Pressure well controlled.   Plan:  F/U PRN  .  Catherine [name]  [id number] here for postpartum depression screen. Pt is currently 7 weeks postpartum. Pt reports doing well and happy.   Edinburgh Postnatal Depression Scale - 03/07/24 1049       Edinburgh Postnatal Depression Scale:  In the Past 7 Days   I have been able to laugh and see the funny side of things. 0    I have looked forward with enjoyment to things. 0    I have blamed myself unnecessarily when things went wrong. 0    I have been anxious or worried for no good reason. 2    I have felt scared or panicky for no good reason. 1    Things have been getting on top of me. 0    I have been so unhappy that I have had difficulty sleeping. 0    I have felt sad or miserable. 0    I have been so unhappy that I have been crying. 0    The thought of harming myself has occurred to me. 0    Edinburgh Postnatal Depression Scale Total 3               Discussed with provider results of Edinburgh 3.  Pt to follow up PRN .

## 2024-04-11 ENCOUNTER — Ambulatory Visit: Admitting: Obstetrics & Gynecology

## 2024-05-20 ENCOUNTER — Encounter: Payer: Self-pay | Admitting: *Deleted

## 2024-05-25 MED ORDER — SODIUM CHLORIDE 0.9 % IV SOLN
INTRAVENOUS | Status: DC | PRN
  Administered 2024-01-16 – 2024-05-25 (×2): 500 mg via INTRAVENOUS

## 2024-05-25 NOTE — Addendum Note (Signed)
 Addendum  created 05/25/24 0759 by Mylo Asberry BIRCH, CRNA   Intraprocedure Meds edited

## 2024-06-04 ENCOUNTER — Ambulatory Visit (INDEPENDENT_AMBULATORY_CARE_PROVIDER_SITE_OTHER): Admitting: Obstetrics & Gynecology

## 2024-06-04 ENCOUNTER — Encounter: Payer: Self-pay | Admitting: Obstetrics & Gynecology

## 2024-06-04 VITALS — BP 137/85 | HR 90 | Wt 277.0 lb

## 2024-06-04 DIAGNOSIS — Z30011 Encounter for initial prescription of contraceptive pills: Secondary | ICD-10-CM

## 2024-06-04 DIAGNOSIS — Z01419 Encounter for gynecological examination (general) (routine) without abnormal findings: Secondary | ICD-10-CM | POA: Diagnosis not present

## 2024-06-04 DIAGNOSIS — Z1331 Encounter for screening for depression: Secondary | ICD-10-CM | POA: Diagnosis not present

## 2024-06-04 MED ORDER — NORETHINDRONE 0.35 MG PO TABS: 1.0000 | ORAL_TABLET | Freq: Every day | ORAL | 11 refills | Status: DC

## 2024-06-04 NOTE — Progress Notes (Signed)
 GYNECOLOGY ANNUAL PREVENTATIVE CARE ENCOUNTER NOTE  History:    Catherine [name] is a 23 y.o. G32P1001 female here for a routine annual gynecologic exam.  Current complaints: wants to start oral contraceptive pills.   Denies abnormal vaginal bleeding, discharge, pelvic pain, problems with intercourse or other gynecologic concerns.  Gynecologic History Patient's last menstrual period was 05/21/2024 (approximate). Contraception: none Last Pap: 02/03/2023. Result was normal with negative HPV  Obstetric History OB History  Gravida Para Term Preterm AB Living  1 1 1  0 0 1  SAB IAB Ectopic Multiple Live Births  0 0 0 0 1    # Outcome Date GA Lbr Len/2nd Weight Sex Type Anes PTL Lv  1 Term 01/16/24 [redacted]w[redacted]d  6 lb 10.9 oz (3.03 kg) F CS-LTranv EPI  LIV    Past Medical History:  Diagnosis Date   Allergic rhinitis 04/01/2010   Qualifier: Diagnosis of   By: Mario MD, Lori         Allergy    Anxiety    Asthma    past hx   Blanching rash 10/19/2022   Childhood asthma, mild intermittent, uncomplicated 02/01/2007   Qualifier: Diagnosis of   By: Bari MD, Theodoro         Depression    Family history of brain aneurysm- father died around age 40  04/26/20   Gestational hypertension 01/14/2024   Health care maintenance Apr 26, 2020   Care Gaps     High risk heterosexual behavior 26-Apr-2020   History of anemia Apr 26, 2020   Maternal varicella, non-immune 10/05/2023   Menorrhagia with irregular cycle 2020-04-26   Migraine    Pelvic pain 2020-04-26   Polyuria Apr 26, 2020   Right lower quadrant pain 2020/04/26    Past Surgical History:  Procedure Laterality Date   CESAREAN SECTION N/A 01/16/2024   Procedure: CESAREAN SECTION;  Surgeon: Alger Gong, MD;  Location: [facility];  Service: Obstetrics;  Laterality: N/A;   TONSILLECTOMY Bilateral 08/11/2015   Procedure: CONTROL POST TONSILLECTOMY BLEEDING ;  Surgeon: Deward Argue, MD;  Location: [facility] ORS;  Service: ENT;  Laterality: Bilateral;    TONSILLECTOMY AND ADENOIDECTOMY N/A 07/31/2015   Procedure: TONSILLECTOMY AND ADENOIDECTOMY;  Surgeon: Chinita Hasten, MD;  Location: [facility];  Service: ENT;  Laterality: N/A;   WISDOM TOOTH EXTRACTION      Current Outpatient Medications on File Prior to Visit  Medication Sig Dispense Refill   ibuprofen  (ADVIL ) 600 MG tablet Take 1 tablet (600 mg total) by mouth every 6 (six) hours as needed. 30 tablet 0   No current facility-administered medications on file prior to visit.    Allergies  Allergen Reactions   Amoxicillin  Hives    Social History:  reports that she has never smoked. She has never been exposed to tobacco smoke. She has never used smokeless tobacco. She reports that she does not currently use alcohol. She reports that she does not currently use drugs.  Family History  Problem Relation Age of Onset   Heart disease Mother    Bipolar disorder Mother    Depression Mother    Cancer Mother    Schizophrenia Mother    Asthma Father    Hypertension Father    Aneurysm Father    Hypertension Brother    Diabetes Brother    Breast cancer Maternal Grandmother    Cancer Maternal Grandmother    Lung disease Maternal Grandmother    Heart disease Maternal Grandfather    Heart attack Maternal Grandfather  Aneurysm Paternal Grandfather     The following portions of the patient's history were reviewed and updated as appropriate: allergies, current medications, past family history, past medical history, past social history, past surgical history and problem list.  Review of Systems Pertinent items noted in HPI and remainder of comprehensive ROS otherwise negative.  Physical Exam:  BP 137/85   Pulse 90   Wt 277 lb (125.6 kg)   LMP 05/21/2024 (Approximate)   BMI 49.07 kg/m  CONSTITUTIONAL: Well-developed, well-nourished female in no acute distress.  HENT:  Normocephalic, atraumatic, External right and left ear normal.  EYES: Conjunctivae and EOM are normal.  Pupils are equal, round, and reactive to light. No scleral icterus.  NECK: Normal range of motion, supple, no masses observed. SKIN: Skin is warm and dry. No rash noted. Not diaphoretic. No erythema. No pallor. MUSCULOSKELETAL: Normal range of motion. No tenderness.  No cyanosis, clubbing, or edema. NEUROLOGIC: Alert and oriented to person, place, and time. Normal muscle tone coordination.  PSYCHIATRIC: Normal mood and affect. Normal behavior. Normal judgment and thought content. CARDIOVASCULAR: Normal heart rate noted, regular rhythm RESPIRATORY: Clear to auscultation bilaterally. Effort and breath sounds normal, no problems with respiration noted. BREASTS: Deferred ABDOMEN: Soft, no distention noted.  No tenderness, rebound or guarding.  PELVIC: Deferred  Assessment and Plan:    1. Oral contraception initiation Given her history of likely Stage 1 hypertension (had GHTN recently) and migraines with aura, she is not a candidate for estrogen/combined OCPs.  Discussed progestin only methods, patient wanted to try pills.  Discussed use of the POPs, all questions answered.   - norethindrone  (MICRONOR ) 0.35 MG tablet; Take 1 tablet (0.35 mg total) by mouth daily.  Dispense: 28 tablet; Refill: 11  2. Well woman exam with routine gynecological exam (Primary) Up to date on pap smear.  Routine preventative health maintenance measures emphasized. Please refer to After Visit Summary for other counseling recommendations.      [name], MD, FACOG Obstetrician & Gynecologist, [facility] for [employer], [facility]

## 2024-06-21 ENCOUNTER — Emergency Department

## 2024-06-21 ENCOUNTER — Ambulatory Visit: Payer: Self-pay

## 2024-06-21 ENCOUNTER — Emergency Department
Admission: EM | Admit: 2024-06-21 | Discharge: 2024-06-21 | Disposition: A | Discharge status: Home / Self Care | Attending: Emergency Medicine | Admitting: Emergency Medicine

## 2024-06-21 ENCOUNTER — Other Ambulatory Visit: Payer: Self-pay

## 2024-06-21 ENCOUNTER — Encounter: Payer: Self-pay | Admitting: Emergency Medicine

## 2024-06-21 DIAGNOSIS — R0602 Shortness of breath: Secondary | ICD-10-CM | POA: Diagnosis not present

## 2024-06-21 DIAGNOSIS — R079 Chest pain, unspecified: Secondary | ICD-10-CM | POA: Diagnosis not present

## 2024-06-21 DIAGNOSIS — R42 Dizziness and giddiness: Secondary | ICD-10-CM | POA: Insufficient documentation

## 2024-06-21 DIAGNOSIS — R0902 Hypoxemia: Secondary | ICD-10-CM | POA: Diagnosis not present

## 2024-06-21 DIAGNOSIS — R519 Headache, unspecified: Secondary | ICD-10-CM | POA: Diagnosis not present

## 2024-06-21 DIAGNOSIS — R000 Tachycardia, unspecified: Secondary | ICD-10-CM | POA: Diagnosis not present

## 2024-06-21 DIAGNOSIS — R0789 Other chest pain: Secondary | ICD-10-CM | POA: Diagnosis not present

## 2024-06-21 LAB — CBC
HCT: 38.7 % (ref 36.0–46.0)
Hemoglobin: 12.4 g/dL (ref 12.0–15.0)
MCH: 24.2 pg — ABNORMAL LOW (ref 26.0–34.0)
MCHC: 32 g/dL (ref 30.0–36.0)
MCV: 75.6 fL — ABNORMAL LOW (ref 80.0–100.0)
Platelets: 439 K/uL — ABNORMAL HIGH (ref 150–400)
RBC: 5.12 MIL/uL — ABNORMAL HIGH (ref 3.87–5.11)
RDW: 15.3 % (ref 11.5–15.5)
WBC: 10.9 K/uL — ABNORMAL HIGH (ref 4.0–10.5)
nRBC: 0 % (ref 0.0–0.2)

## 2024-06-21 LAB — BASIC METABOLIC PANEL WITH GFR
Anion gap: 9 (ref 5–15)
BUN: 7 mg/dL (ref 6–20)
CO2: 21 mmol/L — ABNORMAL LOW (ref 22–32)
Calcium: 9.1 mg/dL (ref 8.9–10.3)
Chloride: 110 mmol/L (ref 98–111)
Creatinine, Ser: 0.76 mg/dL (ref 0.44–1.00)
GFR, Estimated: >60 mL/min (ref >60)
Glucose, Bld: 95 mg/dL (ref 70–99)
Potassium: 4 mmol/L (ref 3.5–5.1)
Sodium: 140 mmol/L (ref 135–145)

## 2024-06-21 LAB — D-DIMER, QUANTITATIVE: D-Dimer, Quant: 0.6 ug{FEU}/mL — ABNORMAL HIGH (ref 0.00–0.50)

## 2024-06-21 LAB — TROPONIN I (HIGH SENSITIVITY)
Troponin I (High Sensitivity): <2 ng/L (ref <18)
Troponin I (High Sensitivity): 3 ng/L (ref <18)

## 2024-06-21 LAB — POC URINE PREG, ED: Preg Test, Ur: NEGATIVE

## 2024-06-21 MED ORDER — LACTATED RINGERS IV BOLUS
1000.0000 mL | Freq: Once | INTRAVENOUS | Status: AC
  Administered 2024-06-21: 1000 mL via INTRAVENOUS

## 2024-06-21 MED ORDER — IOHEXOL 350 MG/ML SOLN
75.0000 mL | Freq: Once | INTRAVENOUS | Status: AC | PRN
  Administered 2024-06-21: 75 mL via INTRAVENOUS

## 2024-06-21 MED ORDER — KETOROLAC TROMETHAMINE 30 MG/ML IJ SOLN
15.0000 mg | Freq: Once | INTRAMUSCULAR | Status: AC
  Administered 2024-06-21: 15 mg via INTRAVENOUS
  Filled 2024-06-21: qty 1

## 2024-06-21 NOTE — ED Triage Notes (Signed)
 Patient to ED via [facility] from work for [employer]. Started around 1050. States she felt like her ears were full of water  and jittery. Denies cardiac hx.

## 2024-06-21 NOTE — ED Triage Notes (Signed)
 First nurse note: Pt here via [facility] from work with /[employer] of [employer] for past few hours.     152/97 HR: 110 100% RA   20G R AC 324 ASA  1 nitroglycerin spray

## 2024-06-21 NOTE — ED Provider Notes (Signed)
 [facility] Provider Note    Event Date/Time   First MD Initiated Contact with Patient 06/21/24 1436     (approximate)   History   Chest Pain   HPI  LIAH MORR is a 23 y.o. female who presents to the ED for evaluation of Chest Pain   Review of routine OB/GYN visit from 7/1.  G1, P1 who delivered back in February.  Started on Micronor   Patient presents to the ED alongside her husband for evaluation of chest discomfort, shortness of breath and dizziness that started few hours ago while she was at work today.  Felt normal this morning.  No recent illnesses.  No falls or syncope.  Chest pain is improved but still present.   Physical Exam   Triage Vital Signs: ED Triage Vitals  Encounter Vitals Group     BP 06/21/24 1319 (!) 143/107     Girls Systolic BP Percentile --      Girls Diastolic BP Percentile --      Boys Systolic BP Percentile --      Boys Diastolic BP Percentile --      Pulse Rate 06/21/24 1319 (!) 101     Resp 06/21/24 1319 18     Temp 06/21/24 1319 98 F (36.7 C)     Temp Source 06/21/24 1319 Oral     SpO2 06/21/24 1319 100 %     Weight 06/21/24 1317 270 lb (122.5 kg)     Height 06/21/24 1317 5' 3 (1.6 m)     Head Circumference --      Peak Flow --      Pain Score 06/21/24 1317 6     Pain Loc --      Pain Education --      Exclude from Growth Chart --     Most recent vital signs: Vitals:   06/21/24 1319  BP: (!) 143/107  Pulse: (!) 101  Resp: 18  Temp: 98 F (36.7 C)  SpO2: 100%    General: Awake, no distress.  CV:  Good peripheral perfusion.  RRR without murmur Resp:  Normal effort.  Clear Abd:  No distention.  Soft MSK:  No deformity noted.  Neuro:  No focal deficits appreciated. Other:     ED Results / Procedures / Treatments   Labs (all labs ordered are listed, but only abnormal results are displayed) Labs Reviewed  BASIC METABOLIC PANEL WITH GFR - Abnormal; Notable for the following components:       Result Value   CO2 21 (*)    All other components within normal limits  CBC - Abnormal; Notable for the following components:   WBC 10.9 (*)    RBC 5.12 (*)    MCV 75.6 (*)    MCH 24.2 (*)    Platelets 439 (*)    All other components within normal limits  D-DIMER, QUANTITATIVE - Abnormal; Notable for the following components:   D-Dimer, Quant 0.60 (*)    All other components within normal limits  POC URINE PREG, ED  TROPONIN I (HIGH SENSITIVITY)  TROPONIN I (HIGH SENSITIVITY)    EKG Sinus rhythm with a rate of 100 bpm.  Normal axis and intervals without clear signs of acute ischemia.  RADIOLOGY CXR interpreted by me without evidence of acute cardiopulmonary pathology. CTA chest interpreted by me without PE  Official radiology report(s): CT Angio Chest PE W and/or Wo Contrast Result Date: 06/21/2024 CLINICAL DATA:  Chest pain, shortness of  breath, and elevated D-dimer. Recently postpartum. EXAM: CT ANGIOGRAPHY CHEST WITH CONTRAST TECHNIQUE: Multidetector CT imaging of the chest was performed using the standard protocol during bolus administration of intravenous contrast. Multiplanar CT image reconstructions and MIPs were obtained to evaluate the vascular anatomy. RADIATION DOSE REDUCTION: This exam was performed according to the departmental dose-optimization program which includes automated exposure control, adjustment of the mA and/or kV according to patient size and/or use of iterative reconstruction technique. CONTRAST:  75mL OMNIPAQUE  IOHEXOL  350 MG/ML SOLN COMPARISON:  Same day chest radiograph, CTA chest dated 09/10/2022 FINDINGS: Cardiovascular: The study is high quality for the evaluation of pulmonary embolism. There are no filling defects in the central, lobar, segmental or subsegmental pulmonary artery branches to suggest acute pulmonary embolism. Great vessels are normal in course and caliber. Normal heart size. No significant pericardial fluid/thickening. Mediastinum/Nodes:  Imaged thyroid  gland without nodules meeting criteria for imaging follow-up by size. Normal esophagus. No pathologically enlarged axillary, supraclavicular, mediastinal, or hilar lymph nodes. Lungs/Pleura: The central airways are patent. No focal consolidation. No pneumothorax. No pleural effusion. Upper abdomen: Gallbladder is contracted. Again seen is mildly prominent liver. Musculoskeletal: No acute or abnormal lytic or blastic osseous lesions. Review of the MIP images confirms the above findings. IMPRESSION: No evidence of pulmonary embolism or other acute intrathoracic process. Electronically Signed   By: Limin  Xu M.D.   On: 06/21/2024 16:16   DG Chest 2 View Result Date: 06/21/2024 CLINICAL DATA:  Chest pain EXAM: CHEST - 2 VIEW COMPARISON:  Chest radiograph dated 01/16/2023 FINDINGS: Normal lung volumes. No focal consolidations. No pleural effusion or pneumothorax. The heart size and mediastinal contours are within normal limits. No acute osseous abnormality. IMPRESSION: No active cardiopulmonary disease. Electronically Signed   By: Limin  Xu M.D.   On: 06/21/2024 13:39    PROCEDURES and INTERVENTIONS:  .1-3 Lead EKG Interpretation  Performed by: Claudene Rover, MD Authorized by: Claudene Rover, MD     Interpretation: normal     ECG rate:  99   ECG rate assessment: normal     Rhythm: sinus rhythm     Ectopy: none     Conduction: normal     Medications  ketorolac  (TORADOL ) 30 MG/ML injection 15 mg (15 mg Intravenous Given 06/21/24 1549)  lactated ringers  bolus 1,000 mL (1,000 mLs Intravenous New Bag/Given 06/21/24 1548)  iohexol  (OMNIPAQUE ) 350 MG/ML injection 75 mL (75 mLs Intravenous Contrast Given 06/21/24 1556)     IMPRESSION / MDM / ASSESSMENT AND PLAN / ED COURSE  I reviewed the triage vital signs and the nursing notes.  Differential diagnosis includes, but is not limited to, ACS, PTX, PNA, muscle strain/spasm, PE, dissection, anxiety, pleural effusion  {Patient presents with  symptoms of an acute illness or injury that is potentially life-threatening.  Low risk patient presents with atypical chest discomfort with a benign workup and suitable for outpatient management.  Due to her tachycardia, cannot use PERC criteria.  D-dimer is elevated and so CTA chest obtained without evidence of PE.  2 negative troponins, normal hemoglobin and metabolic panel.  Pain resolved with Toradol  and she otherwise looks well and suitable for outpatient management  Clinical Course as of 06/21/24 1633  Fri Jun 21, 2024  1632 Reassessed. Feeling much better after the Toradol , we discussed reassuring workup, normal CTA chest and overall serum workup.  Discussed possible etiologies of symptoms and precautions [DS]    Clinical Course User Index [DS] Claudene Rover, MD     FINAL CLINICAL IMPRESSION(S) /  ED DIAGNOSES   Final diagnoses:  Other chest pain     Rx / DC Orders   ED Discharge Orders     None        Note:  This document was prepared using Dragon voice recognition software and may include unintentional dictation errors.   Claudene Rover, MD 06/21/24 [phone]

## 2024-06-21 NOTE — Telephone Encounter (Signed)
 FYI Only or Action Required?: FYI only for provider.  Patient was last seen in primary care on 02/21/2024 by Gabino Boga, MD.  Called Nurse Triage reporting Chest Pain.  Symptoms began today.  Interventions attempted: Nothing.  Symptoms are: gradually worsening.  Triage Disposition: Call EMS 911 Now  Patient/caregiver understands and will follow disposition?: Yes     Copied from CRM [id number]. Topic: Clinical - Red Word Triage >> Jun 21, 2024 12:27 PM Catherine [name] wrote: Red Word that prompted transfer to Nurse Triage: Patient states she came back from lunch and her chest was hurting and she's having headaches. States she went to the pharmacy and they took her blood pressure. It was 149/89. Patient states her ears feel like they are full of water , chest hurting & skin feels like it's vibrating. Not sure if she should go to the ER or not. Reason for Disposition  [1] Chest pain lasts > 5 minutes AND [2] described as crushing, pressure-like, or heavy  Answer Assessment - Initial Assessment Questions 1. LOCATION: Where does it hurt?       Middle of chest 2. RADIATION: Does the pain go anywhere else? (e.g., into neck, jaw, arms, back)     just 3. ONSET: When did the chest pain begin? (Minutes, hours or days)      Today  4. PATTERN: Does the pain come and go, or has it been constant since it started?  Does it get worse with exertion?      constant 5. DURATION: How long does it last (e.g., seconds, minutes, hours)     Started around 11 am this morning 6. SEVERITY: How bad is the pain?  (e.g., Scale 1-10; mild, moderate, or severe)     9/10 7. CARDIAC RISK FACTORS: Do you have any history of heart problems or risk factors for heart disease? (e.g., angina, prior heart attack; diabetes, high blood pressure, high cholesterol, smoker, or strong family history of heart disease)     Gestational hypertension 8. PULMONARY RISK FACTORS: Do you have any history of lung  disease?  (e.g., blood clots in lung, asthma, emphysema, birth control pills)     no 9. CAUSE: What do you think is causing the chest pain?     unknown 10. OTHER SYMPTOMS: Do you have any other symptoms? (e.g., dizziness, nausea, vomiting, sweating, fever, difficulty breathing, cough)       Ears feel full, dizziness, jittery. headache  Protocols used: Chest Pain-A-AH

## 2024-07-04 ENCOUNTER — Ambulatory Visit: Payer: Self-pay | Admitting: Student

## 2024-07-04 ENCOUNTER — Inpatient Hospital Stay: Payer: Self-pay | Admitting: Student

## 2024-07-04 VITALS — BP 125/90 | HR 84 | Temp 98.4°F | Ht 63.0 in | Wt 275.2 lb

## 2024-07-04 DIAGNOSIS — H9202 Otalgia, left ear: Secondary | ICD-10-CM | POA: Diagnosis not present

## 2024-07-04 DIAGNOSIS — Z6841 Body Mass Index (BMI) 40.0 and over, adult: Secondary | ICD-10-CM | POA: Diagnosis not present

## 2024-07-04 DIAGNOSIS — E785 Hyperlipidemia, unspecified: Secondary | ICD-10-CM | POA: Diagnosis not present

## 2024-07-04 DIAGNOSIS — R0789 Other chest pain: Secondary | ICD-10-CM | POA: Diagnosis not present

## 2024-07-04 NOTE — Assessment & Plan Note (Signed)
 Started 1 week ago, intermittently 8/10.

## 2024-07-04 NOTE — Assessment & Plan Note (Signed)
 See in ED on 7/19.  D-dimer was elevated but CTA chest without evidence of PE. 2 negative troponins, normal hemoglobin and metabolic panel. Pain resolved with Toradol . Today,

## 2024-07-04 NOTE — Progress Notes (Unsigned)
 CC: HFU  HPI:  Catherine [name] is a 23 y.o. female living with a history stated below and presents today for follow-up. Please see problem based assessment and plan for additional details.  Past Medical History:  Diagnosis Date   Allergic rhinitis 04/01/2010   Qualifier: Diagnosis of   By: Mario MD, Katheryn         Allergy    Anxiety    Asthma    past hx   Blanching rash 10/19/2022   Childhood asthma, mild intermittent, uncomplicated 02/01/2007   Qualifier: Diagnosis of   By: Bari MD, Theodoro         Depression    Family history of brain aneurysm- father died around age 2  2020/04/26   Gestational hypertension 01/14/2024   Health care maintenance 26-Apr-2020   Care Gaps     High risk heterosexual behavior 04/26/20   History of anemia 2020-04-26   Maternal varicella, non-immune 10/05/2023   Menorrhagia with irregular cycle 04-26-20   Migraine    Pelvic pain Apr 26, 2020   Polyuria 2020-04-26   Right lower quadrant pain 04-26-2020    Current Outpatient Medications on File Prior to Visit  Medication Sig Dispense Refill   ibuprofen  (ADVIL ) 600 MG tablet Take 1 tablet (600 mg total) by mouth every 6 (six) hours as needed. 30 tablet 0   norethindrone  (MICRONOR ) 0.35 MG tablet Take 1 tablet (0.35 mg total) by mouth daily. 28 tablet 11   No current facility-administered medications on file prior to visit.    Family History  Problem Relation Age of Onset   Heart disease Mother    Bipolar disorder Mother    Depression Mother    Cancer Mother    Schizophrenia Mother    Asthma Father    Hypertension Father    Aneurysm Father    Hypertension Brother    Diabetes Brother    Breast cancer Maternal Grandmother    Cancer Maternal Grandmother    Lung disease Maternal Grandmother    Heart disease Maternal Grandfather    Heart attack Maternal Grandfather    Aneurysm Paternal Grandfather     Social History   Socioeconomic History   Marital status: Significant Other     Spouse name: Coletta   Number of children: Not on file   Years of education: Not on file   Highest education level: Not on file  Occupational History   Not on file  Tobacco Use   Smoking status: Never    Passive exposure: Never   Smokeless tobacco: Never  Vaping Use   Vaping status: Never Used  Substance and Sexual Activity   Alcohol use: Not Currently    Comment: once per month, 1-2 drinks   Drug use: Not Currently    Comment: last use feb 2024   Sexual activity: Yes    Birth control/protection: None  Other Topics Concern   Not on file  Social History Narrative   Not on file   Social Drivers of Health   Financial Resource Strain: Not on file  Food Insecurity: No Food Insecurity (01/14/2024)   Hunger Vital Sign    Worried About Running Out of Food in the Last Year: Never true    Ran Out of Food in the Last Year: Never true  Transportation Needs: No Transportation Needs (01/14/2024)   PRAPARE - Administrator, Civil Service (Medical): No    Lack of Transportation (Non-Medical): No  Physical Activity: Not on file  Stress: Not  on file  Social Connections: Moderately Integrated (01/18/2023)   Social Connection and Isolation Panel    Frequency of Communication with Friends and Family: More than three times a week    Frequency of Social Gatherings with Friends and Family: More than three times a week    Attends Religious Services: More than 4 times per year    Active Member of [employer] or Organizations: No    Attends Banker Meetings: Never    Marital Status: Living with partner  Intimate Partner Violence: Not At Risk (01/17/2024)   Humiliation, Afraid, Rape, and Kick questionnaire    Fear of Current or Ex-Partner: No    Emotionally Abused: No    Physically Abused: No    Sexually Abused: No    Review of Systems: ROS negative except for what is noted on the assessment and plan.  Vitals:   07/04/24 0827  BP: (!) 125/90  Pulse: 84  Temp: 98.4 F (36.9  C)  TempSrc: Oral  SpO2: 97%  Weight: 275 lb 3.2 oz (124.8 kg)  Height: 5' 3 (1.6 m)    Physical Exam: Constitutional: well-appearing, sitting in chair, in no acute distress Cardiovascular: regular rate and rhythm, no m/r/g Pulmonary/Chest: normal work of breathing on room air, lungs clear to auscultation bilaterally Abdominal: soft, non-tender, non-distended MSK: normal bulk and tone Skin: warm and dry Psych: normal mood and behavior  Assessment & Plan:     Patient discussed with Dr. Jeanelle  Atypical chest pain See in ED on 7/19.  D-dimer was elevated but CTA chest without evidence of PE. 2 negative troponins, normal hemoglobin and metabolic panel. Pain resolved with Toradol . Today,   Ear pain, left Started 1 week ago, intermittently 8/10.    Norman Lobstein, D.O. [facility] Internal Medicine, PGY-2 Phone: [phone] Date 07/04/2024 Time 9:03 AM

## 2024-07-05 ENCOUNTER — Ambulatory Visit: Payer: Self-pay | Admitting: Student

## 2024-07-05 DIAGNOSIS — E785 Hyperlipidemia, unspecified: Secondary | ICD-10-CM | POA: Insufficient documentation

## 2024-07-05 LAB — HEMOGLOBIN A1C
Est. average glucose Bld gHb Est-mCnc: 108 mg/dL
Hgb A1c MFr Bld: 5.4 % (ref 4.8–5.6)

## 2024-07-05 LAB — LIPID PANEL
Chol/HDL Ratio: 5.3 ratio — ABNORMAL HIGH (ref 0.0–4.4)
Cholesterol, Total: 195 mg/dL (ref 100–199)
HDL: 37 mg/dL — ABNORMAL LOW (ref >39)
LDL Chol Calc (NIH): 140 mg/dL — ABNORMAL HIGH (ref 0–99)
Triglycerides: 98 mg/dL (ref 0–149)
VLDL Cholesterol Cal: 18 mg/dL (ref 5–40)

## 2024-07-05 NOTE — Assessment & Plan Note (Signed)
 LDL was 119, 4 years ago.  Will recheck lipid panel today and assess ASCVD risk.

## 2024-07-05 NOTE — Assessment & Plan Note (Signed)
 Will check A1c today to see if she would be a candidate for GLP-1.  I extensively counseled on diet and lifestyle recommendations which she will begin to improve upon.

## 2024-07-06 NOTE — Progress Notes (Signed)
 Internal Medicine Clinic Attending  I was physically present during the key portions of the resident provided service and participated in the medical decision making of patient's management care. I reviewed pertinent patient test results.  The assessment, diagnosis, and plan were formulated together and I agree with the documentation in the resident's note.  Carney Living, MD

## 2024-10-03 ENCOUNTER — Ambulatory Visit: Admitting: Obstetrics and Gynecology

## 2024-10-03 ENCOUNTER — Encounter: Payer: Self-pay | Admitting: Obstetrics and Gynecology

## 2024-10-04 NOTE — Progress Notes (Signed)
 Patient did not keep her OBGYN appointment for 10/03/2024.  Bebe Izell Raddle MD Attending [facility] [employer] [facility])

## 2024-10-12 ENCOUNTER — Inpatient Hospital Stay (HOSPITAL_COMMUNITY)
Admission: AD | Admit: 2024-10-12 | Discharge: 2024-10-12 | Disposition: A | Discharge status: Home / Self Care | Attending: Obstetrics and Gynecology | Admitting: Obstetrics and Gynecology

## 2024-10-12 ENCOUNTER — Encounter: Payer: Self-pay | Admitting: Student

## 2024-10-12 ENCOUNTER — Inpatient Hospital Stay (HOSPITAL_COMMUNITY)

## 2024-10-12 DIAGNOSIS — N8311 Corpus luteum cyst of right ovary: Secondary | ICD-10-CM | POA: Insufficient documentation

## 2024-10-12 DIAGNOSIS — O26899 Other specified pregnancy related conditions, unspecified trimester: Secondary | ICD-10-CM

## 2024-10-12 DIAGNOSIS — O468X1 Other antepartum hemorrhage, first trimester: Secondary | ICD-10-CM

## 2024-10-12 DIAGNOSIS — Z3A10 10 weeks gestation of pregnancy: Secondary | ICD-10-CM

## 2024-10-12 DIAGNOSIS — O418X10 Other specified disorders of amniotic fluid and membranes, first trimester, not applicable or unspecified: Secondary | ICD-10-CM

## 2024-10-12 DIAGNOSIS — O26891 Other specified pregnancy related conditions, first trimester: Secondary | ICD-10-CM

## 2024-10-12 DIAGNOSIS — Z3491 Encounter for supervision of normal pregnancy, unspecified, first trimester: Secondary | ICD-10-CM

## 2024-10-12 DIAGNOSIS — R109 Unspecified abdominal pain: Secondary | ICD-10-CM | POA: Diagnosis not present

## 2024-10-12 DIAGNOSIS — O3481 Maternal care for other abnormalities of pelvic organs, first trimester: Secondary | ICD-10-CM | POA: Diagnosis not present

## 2024-10-12 DIAGNOSIS — M545 Low back pain, unspecified: Secondary | ICD-10-CM | POA: Diagnosis not present

## 2024-10-12 LAB — CBC
HCT: 37 % (ref 36.0–46.0)
Hemoglobin: 11.8 g/dL — ABNORMAL LOW (ref 12.0–15.0)
MCH: 23.9 pg — ABNORMAL LOW (ref 26.0–34.0)
MCHC: 31.9 g/dL (ref 30.0–36.0)
MCV: 75.1 fL — ABNORMAL LOW (ref 80.0–100.0)
Platelets: 426 K/uL — ABNORMAL HIGH (ref 150–400)
RBC: 4.93 MIL/uL (ref 3.87–5.11)
RDW: 15.7 % — ABNORMAL HIGH (ref 11.5–15.5)
WBC: 11.2 K/uL — ABNORMAL HIGH (ref 4.0–10.5)
nRBC: 0 % (ref 0.0–0.2)

## 2024-10-12 LAB — URINALYSIS, ROUTINE W REFLEX MICROSCOPIC
Bilirubin Urine: NEGATIVE
Glucose, UA: NEGATIVE mg/dL
Hgb urine dipstick: NEGATIVE
Ketones, ur: 20 mg/dL — AB
Leukocytes,Ua: NEGATIVE
Nitrite: NEGATIVE
Protein, ur: 30 mg/dL — AB
Specific Gravity, Urine: 1.021 (ref 1.005–1.030)
pH: 5 (ref 5.0–8.0)

## 2024-10-12 LAB — WET PREP, GENITAL
Clue Cells Wet Prep HPF POC: NONE SEEN
Sperm: NONE SEEN
Trich, Wet Prep: NONE SEEN
WBC, Wet Prep HPF POC: <10 (ref <10)
Yeast Wet Prep HPF POC: NONE SEEN

## 2024-10-12 LAB — HCG, QUANTITATIVE, PREGNANCY: hCG, Beta Chain, Quant, S: 6964 m[IU]/mL — ABNORMAL HIGH (ref <5)

## 2024-10-12 LAB — POCT PREGNANCY, URINE: Preg Test, Ur: POSITIVE — AB

## 2024-10-12 NOTE — MAU Provider Note (Signed)
 History     CSN: [id number]  Arrival date and time: 10/12/24 1532   Event Date/Time   First Provider Initiated Contact with Patient 10/12/24 1634      Chief Complaint  Patient presents with   Abdominal Pain   HPI Catherine [name] is a 23 y.o. G2P1001 at [redacted]w[redacted]d by unsure LMP who presents for back & abdominal pain. Symptoms started last week. Reports intermittent lower back pain that wraps around her lower abdomen. Has been taking birth control pills & reports irregular periods. Unsure of last period. Reports positive pregnancy test at home last week. Denies n/v/d, constipation, dysuria, vaginal bleeding, or vaginal discharge.   OB History     Gravida  2   Para  1   Term  1   Preterm  0   AB  0   Living  1      SAB  0   IAB  0   Ectopic  0   Multiple  0   Live Births  1           Past Medical History:  Diagnosis Date   Anxiety    Asthma    past hx   Childhood asthma, mild intermittent, uncomplicated 02/01/2007   Qualifier: Diagnosis of   By: Bari MD, Theodoro         Depression    Family history of brain aneurysm- father died around age 57  04/24/20   Gestational hypertension 01/14/2024   History of anemia Apr 24, 2020   Menorrhagia with irregular cycle 24-Apr-2020   Migraine    Polyuria 04/24/20    Past Surgical History:  Procedure Laterality Date   CESAREAN SECTION N/A 01/16/2024   Procedure: CESAREAN SECTION;  Surgeon: Alger Gong, MD;  Location: [facility];  Service: Obstetrics;  Laterality: N/A;   TONSILLECTOMY Bilateral 08/11/2015   Procedure: CONTROL POST TONSILLECTOMY BLEEDING ;  Surgeon: Deward Argue, MD;  Location: [facility] ORS;  Service: ENT;  Laterality: Bilateral;   TONSILLECTOMY AND ADENOIDECTOMY N/A 07/31/2015   Procedure: TONSILLECTOMY AND ADENOIDECTOMY;  Surgeon: Chinita Hasten, MD;  Location: [facility];  Service: ENT;  Laterality: N/A;   WISDOM TOOTH EXTRACTION      Family History  Problem Relation Age of Onset    Heart disease Mother    Bipolar disorder Mother    Depression Mother    Cancer Mother    Schizophrenia Mother    Asthma Father    Hypertension Father    Aneurysm Father    Hypertension Brother    Diabetes Brother    Breast cancer Maternal Grandmother    Cancer Maternal Grandmother    Lung disease Maternal Grandmother    Heart disease Maternal Grandfather    Heart attack Maternal Grandfather    Aneurysm Paternal Grandfather     Social History   Tobacco Use   Smoking status: Never    Passive exposure: Never   Smokeless tobacco: Never  Vaping Use   Vaping status: Never Used  Substance Use Topics   Alcohol use: Not Currently    Comment: once per month, 1-2 drinks   Drug use: Not Currently    Comment: last use feb 2024    Allergies:  Allergies  Allergen Reactions   Amoxicillin  Hives    No medications prior to admission.    Review of Systems  All other systems reviewed and are negative.  Physical Exam   Blood pressure (!) 142/79, pulse 89, temperature 98.8 F (37.1 C), resp. rate 18,  height 5' 3 (1.6 m), weight 121.6 kg, last menstrual period 08/02/2024, not currently breastfeeding.  Physical Exam Vitals and nursing note reviewed.  Constitutional:      General: She is not in acute distress.    Appearance: She is well-developed. She is not ill-appearing.  HENT:     Head: Normocephalic and atraumatic.  Eyes:     General: No scleral icterus.       Right eye: No discharge.        Left eye: No discharge.     Conjunctiva/sclera: Conjunctivae normal.  Pulmonary:     Effort: Pulmonary effort is normal. No respiratory distress.  Neurological:     General: No focal deficit present.     Mental Status: She is alert.  Psychiatric:        Mood and Affect: Mood normal.        Behavior: Behavior normal.     MAU Course  Procedures Results for orders placed or performed during the hospital encounter of 10/12/24 (from the past 24 hours)  Pregnancy, urine POC      Status: Abnormal   Collection Time: 10/12/24  3:48 PM  Result Value Ref Range   Preg Test, Ur POSITIVE (A) NEGATIVE  Urinalysis, Routine w reflex microscopic -Urine, Clean Catch     Status: Abnormal   Collection Time: 10/12/24  3:56 PM  Result Value Ref Range   Color, Urine YELLOW YELLOW   APPearance CLEAR CLEAR   Specific Gravity, Urine 1.021 1.005 - 1.030   pH 5.0 5.0 - 8.0   Glucose, UA NEGATIVE NEGATIVE mg/dL   Hgb urine dipstick NEGATIVE NEGATIVE   Bilirubin Urine NEGATIVE NEGATIVE   Ketones, ur 20 (A) NEGATIVE mg/dL   Protein, ur 30 (A) NEGATIVE mg/dL   Nitrite NEGATIVE NEGATIVE   Leukocytes,Ua NEGATIVE NEGATIVE   RBC / HPF 0-5 0 - 5 RBC/hpf   WBC, UA 0-5 0 - 5 WBC/hpf   Bacteria, UA RARE (A) NONE SEEN   Squamous Epithelial / HPF 0-5 0 - 5 /HPF   Mucus PRESENT   Wet prep, genital     Status: None   Collection Time: 10/12/24  3:56 PM   Specimen: PATH Cytology Cervicovaginal Ancillary Only  Result Value Ref Range   Yeast Wet Prep HPF POC NONE SEEN NONE SEEN   Trich, Wet Prep NONE SEEN NONE SEEN   Clue Cells Wet Prep HPF POC NONE SEEN NONE SEEN   WBC, Wet Prep HPF POC <10 <10   Sperm NONE SEEN   CBC     Status: Abnormal   Collection Time: 10/12/24  4:46 PM  Result Value Ref Range   WBC 11.2 (H) 4.0 - 10.5 K/uL   RBC 4.93 3.87 - 5.11 MIL/uL   Hemoglobin 11.8 (L) 12.0 - 15.0 g/dL   HCT 62.9 63.9 - 53.9 %   MCV 75.1 (L) 80.0 - 100.0 fL   MCH 23.9 (L) 26.0 - 34.0 pg   MCHC 31.9 30.0 - 36.0 g/dL   RDW 84.2 (H) 88.4 - 84.4 %   Platelets 426 (H) 150 - 400 K/uL   nRBC 0.0 0.0 - 0.2 %  hCG, quantitative, pregnancy     Status: Abnormal   Collection Time: 10/12/24  4:46 PM  Result Value Ref Range   hCG, Beta Chain, Quant, S 6,964 (H) <5 mIU/mL   US  OB LESS THAN 14 WEEKS WITH OB TRANSVAGINAL Result Date: 10/12/2024 CLINICAL DATA:  Abdominal pain. EXAM: OBSTETRIC <14 WK US  AND TRANSVAGINAL  OB US  TECHNIQUE: Both transabdominal and transvaginal ultrasound examinations were  performed for complete evaluation of the gestation as well as the maternal uterus, adnexal regions, and pelvic cul-de-sac. Transvaginal technique was performed to assess early pregnancy. COMPARISON:  01/11/2024. FINDINGS: Intrauterine gestational sac: Single Yolk sac:  Yes Embryo:  No Cardiac Activity: No Heart Rate: None MSD: 8.3 mm   5 w   4 d Subchorionic hemorrhage: Small subchorionic hemorrhage measuring 0.7 x 0.3 x 0.3 cm. Maternal uterus/adnexae: A corpus luteal cyst is present on the right. The left ovary is within normal limits. Trace amount of free fluid in the cul-de-sac. IMPRESSION: 1. Probable early intrauterine gestational sac with yolk sac. No fetal pole or cardiac activity yet visualized. Recommend follow-up quantitative B-HCG levels and follow-up US  in 14 days to assess viability. This recommendation follows SRU consensus guidelines: Diagnostic Criteria for Nonviable Pregnancy Early in the First Trimester. LOISE Diedra PARAS Med 2013; 630:8556-48. 2. Small subchorionic hemorrhage. Electronically Signed   By: Leita Birmingham M.D.   On: 10/12/2024 18:14    MDM   Assessment and Plan   1. Abdominal pain affecting pregnancy   2. Subchorionic hematoma in first trimester, single or unspecified fetus   3. Currently pregnant in first trimester with unknown gestational age    -Ultrasound shows IUGS with yolk sac. Patient likely 5-[redacted] wks pregnant but won't adjust EDD until f/u ultrasound -Go[facility] to [facility] for care. Viability scan in 2 weeks - order placed & message sent to office to schedule -U/a & wet prep negative for infection -Reviewed reasons to return to MAU  Rocky Satterfield 10/12/2024, 7:48 PM

## 2024-10-12 NOTE — MAU Note (Signed)
 Catherine [name] is a 23 y.o. at Unknown here in MAU reporting: had a positive HPT 2 days ago. Is on birth control pills but reports periods are irregular with them as well.  Started having lower back pain that wraps around to the front and shrp pains in her RLq. Denies any vag bleeding or discharge.   LMP: 08/02/24 Onset of complaint: 1 week  Pain score: 6-7 Vitals:   10/12/24 1551  BP: (!) 142/79  Pulse: 89  Resp: 18  Temp: 98.8 F (37.1 C)     FHT: n/a   Lab orders placed from triage: UPT

## 2024-10-12 NOTE — Discharge Instructions (Signed)
 Return to care  If you have heavier bleeding that soaks through more than 2 pads per hour for an hour or more If you bleed so much that you feel like you might pass out or you do pass out If you have significant abdominal pain that is not improved with Tylenol 

## 2024-10-14 LAB — GC/CHLAMYDIA PROBE AMP (~~LOC~~) NOT AT ARMC
Chlamydia: NEGATIVE
Comment: NEGATIVE
Comment: NORMAL
Neisseria Gonorrhea: NEGATIVE

## 2024-10-24 ENCOUNTER — Other Ambulatory Visit: Payer: Self-pay

## 2024-10-24 ENCOUNTER — Ambulatory Visit: Admitting: *Deleted

## 2024-10-24 VITALS — BP 138/83 | HR 76 | Wt 268.0 lb

## 2024-10-24 DIAGNOSIS — Z3A01 Less than 8 weeks gestation of pregnancy: Secondary | ICD-10-CM | POA: Diagnosis not present

## 2024-10-24 DIAGNOSIS — Z3481 Encounter for supervision of other normal pregnancy, first trimester: Secondary | ICD-10-CM

## 2024-10-24 DIAGNOSIS — Z8759 Personal history of other complications of pregnancy, childbirth and the puerperium: Secondary | ICD-10-CM | POA: Insufficient documentation

## 2024-10-24 DIAGNOSIS — Z3491 Encounter for supervision of normal pregnancy, unspecified, first trimester: Secondary | ICD-10-CM

## 2024-10-24 DIAGNOSIS — O0990 Supervision of high risk pregnancy, unspecified, unspecified trimester: Secondary | ICD-10-CM | POA: Insufficient documentation

## 2024-10-24 DIAGNOSIS — O34219 Maternal care for unspecified type scar from previous cesarean delivery: Secondary | ICD-10-CM | POA: Insufficient documentation

## 2024-10-24 NOTE — Progress Notes (Signed)
 New OB Intake  I explained I am completing New OB Intake today. We discussed EDD of 06/07/2025, by Last Menstrual Period. Pt is G2P1001. I reviewed her allergies, medications and Medical/Surgical/OB history.    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  Patient also informed that the ultrasound is not being completed with the intent of assessing for fetal or placental anomalies or any pelvic abnormalities. Explained that the purpose of today's ultrasound is to assess for viability.  Patient acknowledges the purpose of the exam and the limitations of the study.     Delivery Plans Plans to deliver at [facility]. Discussed the nature of our practice with multiple providers including residents and students. Due to the size of the practice, the delivering provider may not be the same as those providing prenatal care.   MyChart/Babyscripts MyChart access verified. I explained pt will have some visits in office and some virtually. Babyscripts app discussed and ordered.   Blood Pressure Cuff Blood pressure cuff discussed and has cuff from last pregnancyDiscussed to be used for virtual visits and or if needed BP checks weekly. Advised to start checking her BP weekly due to hx of GTN.  Anatomy US  Explained first  scheduled US  will be around 19 weeks.   Last Pap Diagnosis  Date Value Ref Range Status  02/03/2023   Final   - Negative for intraepithelial lesion or malignancy (NILM)    First visit review I reviewed new OB appt with patient. Explained pt will be seen by Dr Izell at first visit. Discussed [facility] genetic screening with patient will get panorama. Routine prenatal labs to be collected at new OB.    Wanda Buckles, RN 10/24/2024  3:16 PM

## 2024-11-14 ENCOUNTER — Encounter: Payer: Self-pay | Admitting: Obstetrics and Gynecology

## 2024-11-14 ENCOUNTER — Ambulatory Visit: Admitting: Obstetrics and Gynecology

## 2024-11-14 ENCOUNTER — Other Ambulatory Visit: Payer: Self-pay

## 2024-11-14 VITALS — BP 139/88 | HR 80 | Wt 268.5 lb

## 2024-11-14 DIAGNOSIS — O34219 Maternal care for unspecified type scar from previous cesarean delivery: Secondary | ICD-10-CM

## 2024-11-14 DIAGNOSIS — O99210 Obesity complicating pregnancy, unspecified trimester: Secondary | ICD-10-CM

## 2024-11-14 DIAGNOSIS — O0991 Supervision of high risk pregnancy, unspecified, first trimester: Secondary | ICD-10-CM | POA: Diagnosis not present

## 2024-11-14 DIAGNOSIS — O10911 Unspecified pre-existing hypertension complicating pregnancy, first trimester: Secondary | ICD-10-CM

## 2024-11-14 DIAGNOSIS — O10919 Unspecified pre-existing hypertension complicating pregnancy, unspecified trimester: Secondary | ICD-10-CM

## 2024-11-14 DIAGNOSIS — G43109 Migraine with aura, not intractable, without status migrainosus: Secondary | ICD-10-CM

## 2024-11-14 DIAGNOSIS — O09891 Supervision of other high risk pregnancies, first trimester: Secondary | ICD-10-CM | POA: Diagnosis not present

## 2024-11-14 DIAGNOSIS — E559 Vitamin D deficiency, unspecified: Secondary | ICD-10-CM | POA: Diagnosis not present

## 2024-11-14 DIAGNOSIS — Z6841 Body Mass Index (BMI) 40.0 and over, adult: Secondary | ICD-10-CM | POA: Diagnosis not present

## 2024-11-14 DIAGNOSIS — O99211 Obesity complicating pregnancy, first trimester: Secondary | ICD-10-CM | POA: Diagnosis not present

## 2024-11-14 DIAGNOSIS — Z3A10 10 weeks gestation of pregnancy: Secondary | ICD-10-CM | POA: Diagnosis not present

## 2024-11-14 DIAGNOSIS — O0990 Supervision of high risk pregnancy, unspecified, unspecified trimester: Secondary | ICD-10-CM | POA: Diagnosis not present

## 2024-11-14 DIAGNOSIS — O09899 Supervision of other high risk pregnancies, unspecified trimester: Secondary | ICD-10-CM

## 2024-11-14 MED ORDER — MAGNESIUM MALATE 1250 (141.7 MG) MG PO TABS: 1.0000 | ORAL_TABLET | Freq: Every day | ORAL | Status: AC

## 2024-11-14 NOTE — Progress Notes (Signed)
 New OB Note  11/14/2024   Clinic: Center for [facility]  Chief Complaint: new OB  Transfer of Care Patient: no  History of Present Illness: Catherine [name] is a 23 y.o. G2P1001 at 10/5 weeks (EDC 7/4, based on Patient's last menstrual period was 08/31/2024 (exact date).=7wk u/s)  Pregnancy complicated by has Vitamin D  insufficiency; Migraine with aura and without status migrainosus, not intractable; Anxiety with depression; Lumbar disc herniation with radiculopathy; GERD (gastroesophageal reflux disease); BMI 45.0-49.9, adult (HCC); Obesity in pregnancy; Chronic hypertension during pregnancy, antepartum; Hyperlipidemia; Supervision of high risk pregnancy, antepartum; Previous cesarean delivery affecting pregnancy; and Short interval between pregnancies affecting pregnancy, antepartum on their problem list.   Discomfort at the c-section incision.   ROS: A 12-point review of systems was performed and negative, except as stated in the above HPI.  OBGYN History: As per HPI. OB History  Gravida Para Term Preterm AB Living  2 1 1  0 0 1  SAB IAB Ectopic Multiple Live Births  0 0 0 0 1    # Outcome Date GA Lbr Len/2nd Weight Sex Type Anes PTL Lv  2 Current           1 Term 01/16/24 [redacted]w[redacted]d  6 lb 10.9 oz (3.03 kg) F CS-LTranv EPI  LIV   History of pap smears: Yes. Last pap smear 2024 and results were negative   Past Medical History: Past Medical History:  Diagnosis Date   Anxiety    Asthma    past hx   Childhood asthma, mild intermittent, uncomplicated 02/01/2007   Qualifier: Diagnosis of   By: Bari MD, Theodoro         Depression    Family history of brain aneurysm- father died around age 64  2020/04/25   Gestational hypertension 01/14/2024   History of anemia 25-Apr-2020   History of gestational hypertension 10/24/2024   Hyperlipidemia 07/05/2024   Menorrhagia with irregular cycle 04/25/2020   Migraine    Polyuria 2020-04-25   Past Surgical History: Past Surgical  History:  Procedure Laterality Date   CESAREAN SECTION N/A 01/16/2024   Procedure: CESAREAN SECTION;  Surgeon: Alger Gong, MD;  Location: [facility];  Service: Obstetrics;  Laterality: N/A;   TONSILLECTOMY Bilateral 08/11/2015   Procedure: CONTROL POST TONSILLECTOMY BLEEDING ;  Surgeon: Deward Argue, MD;  Location: [facility] ORS;  Service: ENT;  Laterality: Bilateral;   TONSILLECTOMY AND ADENOIDECTOMY N/A 07/31/2015   Procedure: TONSILLECTOMY AND ADENOIDECTOMY;  Surgeon: Chinita Hasten, MD;  Location: [facility];  Service: ENT;  Laterality: N/A;   WISDOM TOOTH EXTRACTION     Family History:  Family History  Problem Relation Age of Onset   Heart disease Mother    Bipolar disorder Mother    Depression Mother    Cancer Mother    Schizophrenia Mother    Asthma Father    Hypertension Father    Aneurysm Father    Hypertension Brother    Diabetes Brother    Breast cancer Maternal Grandmother    Cancer Maternal Grandmother    Lung disease Maternal Grandmother    Heart disease Maternal Grandfather    Heart attack Maternal Grandfather    Aneurysm Paternal Grandfather    Social History:  Social History   Socioeconomic History   Marital status: Significant Other    Spouse name: Coletta   Number of children: Not on file   Years of education: Not on file   Highest education level: Not on file  Occupational History  Not on file  Tobacco Use   Smoking status: Never    Passive exposure: Never   Smokeless tobacco: Never  Vaping Use   Vaping status: Never Used  Substance and Sexual Activity   Alcohol use: Not Currently    Comment: once per month, 1-2 drinks   Drug use: Not Currently    Comment: last use feb 2024   Sexual activity: Yes    Birth control/protection: None  Other Topics Concern   Not on file  Social History Narrative   Not on file   Social Drivers of Health   Tobacco Use: Low Risk (06/21/2024)   Patient History    Smoking Tobacco Use: Never     Smokeless Tobacco Use: Never    Passive Exposure: Never  Financial Resource Strain: Not on file  Food Insecurity: No Food Insecurity (01/14/2024)   Hunger Vital Sign    Worried About Running Out of Food in the Last Year: Never true    Ran Out of Food in the Last Year: Never true  Transportation Needs: No Transportation Needs (01/14/2024)   PRAPARE - Administrator, Civil Service (Medical): No    Lack of Transportation (Non-Medical): No  Physical Activity: Not on file  Stress: Not on file  Social Connections: Moderately Integrated (01/18/2023)   Social Connection and Isolation Panel    Frequency of Communication with Friends and Family: More than three times a week    Frequency of Social Gatherings with Friends and Family: More than three times a week    Attends Religious Services: More than 4 times per year    Active Member of [employer] or Organizations: No    Attends Banker Meetings: Never    Marital Status: Living with partner  Intimate Partner Violence: Not At Risk (01/17/2024)   Humiliation, Afraid, Rape, and Kick questionnaire    Fear of Current or Ex-Partner: No    Emotionally Abused: No    Physically Abused: No    Sexually Abused: No  Depression (PHQ2-9): Low Risk (11/14/2024)   Depression (PHQ2-9)    PHQ-2 Score: 2  Alcohol Screen: Not on file  Housing: Unknown (01/14/2024)   Housing Stability Vital Sign    Unable to Pay for Housing in the Last Year: No    Number of Times Moved in the Last Year: Not on file    Homeless in the Last Year: No  Utilities: Not At Risk (01/14/2024)   AHC Utilities    Threatened with loss of utilities: No  Health Literacy: Not on file   Allergy: Allergies[1]  Current Outpatient Medications: Prenatal vitamin  Physical Exam:   BP 139/88   Pulse 80   Wt 268 lb 8 oz (121.8 kg)   LMP 08/31/2024 (Exact Date)   BMI 47.56 kg/m  Body mass index is 47.56 kg/m. Contractions: Not present Vag. Bleeding: None. Fundal height:  not applicable FHTs: 140s  General appearance: Well nourished, well developed female in no acute distress.  Neck:  Supple, normal appearance, and no thyromegaly  Cardiovascular: S1, S2 normal, no murmur, rub or gallop, regular rate and rhythm Respiratory:  Clear to auscultation bilateral. Normal respiratory effort Abdomen: positive bowel sounds and no masses, hernias; diffusely non tender to palpation, non distended. Well healed low transverse skin incisioin Neuro/Psych:  Normal mood and affect.  Skin:  Warm and dry.   Laboratory: none  Imaging:  As per HPI  Assessment: patient stable  Plan: 1. Supervision of high risk pregnancy, antepartum (  Primary) Recommended low dose ASA. Follow up next visit Desires genetics but given GA and weight, I told her to do it next visit  - US  OB Limited; Future - VITAMIN D  25 Hydroxy (Vit-D Deficiency, Fractures) - Hemoglobin A1c - Protein / creatinine ratio, urine - Culture, OB Urine - CBC/D/Plt+RPR+Rh+ABO+RubIgG... - US  MFM OB DETAIL +14 WK; Future - Comprehensive metabolic panel with GFR - TSH Rfx on Abnormal to Free T4  2. [redacted] weeks gestation of pregnancy - US  OB Limited; Future - VITAMIN D  25 Hydroxy (Vit-D Deficiency, Fractures) - Hemoglobin A1c - Protein / creatinine ratio, urine - Culture, OB Urine - CBC/D/Plt+RPR+Rh+ABO+RubIgG... - US  MFM OB DETAIL +14 WK; Future - Comprehensive metabolic panel with GFR - TSH Rfx on Abnormal to Free T4  3. BMI 45.0-49.9, adult (HCC) D/w her re: 25lbs total weight gain goal - VITAMIN D  25 Hydroxy (Vit-D Deficiency, Fractures) - Hemoglobin A1c - Protein / creatinine ratio, urine - Comprehensive metabolic panel with GFR - TSH Rfx on Abnormal to Free T4  4. Obesity in pregnancy - VITAMIN D  25 Hydroxy (Vit-D Deficiency, Fractures)  5. Chronic hypertension during pregnancy, antepartum Borderline BP. Follow up next visit - VITAMIN D  25 Hydroxy (Vit-D Deficiency, Fractures)  6. Previous  cesarean delivery affecting pregnancy 01/2024 PLTCS after arrest of dilation at 6-7cm after 37wk IOL for GHTN - VITAMIN D  25 Hydroxy (Vit-D Deficiency, Fractures)  7. Vitamin D  insufficiency - VITAMIN D  25 Hydroxy (Vit-D Deficiency, Fractures)  8. Migraine with aura and without status migrainosus, not intractable Behavioral interventions and qday Mg recommended. Pre-exisiting before pregnancy. Referral to [facility] made - VITAMIN D  25 Hydroxy (Vit-D Deficiency, Fractures)  9. Short interval between pregnancies affecting pregnancy, antepartum D/w her  Problem list reviewed and updated.  Follow up in 2 weeks.   >50% of 35 min visit spent on counseling and coordination of care.    Return in about 2 weeks (around 11/28/2024) for 2-3wk, bp check, low risk ob, in person, md or app. No future appointments.  Bebe Izell Overcast MD Attending Center for [facility])     [1]  Allergies Allergen Reactions   Amoxicillin  Hives

## 2024-11-14 NOTE — Progress Notes (Signed)

## 2024-11-15 LAB — VITAMIN D 25 HYDROXY (VIT D DEFICIENCY, FRACTURES): Vit D, 25-Hydroxy: 16.4 ng/mL — ABNORMAL LOW (ref 30.0–100.0)

## 2024-11-15 LAB — CBC/D/PLT+RPR+RH+ABO+RUBIGG...
Antibody Screen: NEGATIVE
Basophils Absolute: 0 x10E3/uL (ref 0.0–0.2)
Basos: 0 %
EOS (ABSOLUTE): 0.3 x10E3/uL (ref 0.0–0.4)
Eos: 3 %
HCV Ab: NONREACTIVE
HIV Screen 4th Generation wRfx: NONREACTIVE
Hematocrit: 42.9 % (ref 34.0–46.6)
Hemoglobin: 13.1 g/dL (ref 11.1–15.9)
Hepatitis B Surface Ag: NEGATIVE
Immature Grans (Abs): 0 x10E3/uL (ref 0.0–0.1)
Immature Granulocytes: 0 %
Lymphocytes Absolute: 2.5 x10E3/uL (ref 0.7–3.1)
Lymphs: 27 %
MCH: 24 pg — ABNORMAL LOW (ref 26.6–33.0)
MCHC: 30.5 g/dL — ABNORMAL LOW (ref 31.5–35.7)
MCV: 79 fL (ref 79–97)
Monocytes Absolute: 0.5 x10E3/uL (ref 0.1–0.9)
Monocytes: 5 %
Neutrophils Absolute: 6.1 x10E3/uL (ref 1.4–7.0)
Neutrophils: 65 %
Platelets: 446 x10E3/uL (ref 150–450)
RBC: 5.45 x10E6/uL — ABNORMAL HIGH (ref 3.77–5.28)
RDW: 15.2 % (ref 11.7–15.4)
RPR Ser Ql: NONREACTIVE
Rh Factor: POSITIVE
Rubella Antibodies, IGG: 1.92 {index} (ref >0.99)
WBC: 9.4 x10E3/uL (ref 3.4–10.8)

## 2024-11-15 LAB — HEMOGLOBIN A1C
Est. average glucose Bld gHb Est-mCnc: 114 mg/dL
Hgb A1c MFr Bld: 5.6 % (ref 4.8–5.6)

## 2024-11-15 LAB — CULTURE, OB URINE

## 2024-11-15 LAB — COMPREHENSIVE METABOLIC PANEL WITH GFR
ALT: 20 IU/L (ref 0–32)
AST: 15 IU/L (ref 0–40)
Albumin: 4.2 g/dL (ref 4.0–5.0)
Alkaline Phosphatase: 68 IU/L (ref 41–116)
BUN/Creatinine Ratio: 10 (ref 9–23)
BUN: 5 mg/dL — ABNORMAL LOW (ref 6–20)
Bilirubin Total: <0.2 mg/dL (ref 0.0–1.2)
CO2: 21 mmol/L (ref 20–29)
Calcium: 9.7 mg/dL (ref 8.7–10.2)
Chloride: 103 mmol/L (ref 96–106)
Creatinine, Ser: 0.52 mg/dL — ABNORMAL LOW (ref 0.57–1.00)
Globulin, Total: 2.6 g/dL (ref 1.5–4.5)
Glucose: 82 mg/dL (ref 70–99)
Potassium: 4.5 mmol/L (ref 3.5–5.2)
Sodium: 138 mmol/L (ref 134–144)
Total Protein: 6.8 g/dL (ref 6.0–8.5)
eGFR: 134 mL/min/1.73 (ref >59)

## 2024-11-15 LAB — PROTEIN / CREATININE RATIO, URINE
Creatinine, Urine: 64.2 mg/dL
Protein, Ur: 7.9 mg/dL
Protein/Creat Ratio: 123 mg/g{creat} (ref 0–200)

## 2024-11-15 LAB — HCV INTERPRETATION

## 2024-11-15 LAB — URINE CULTURE, OB REFLEX

## 2024-11-15 LAB — TSH RFX ON ABNORMAL TO FREE T4: TSH: 0.592 u[IU]/mL (ref 0.450–4.500)

## 2024-11-18 ENCOUNTER — Other Ambulatory Visit: Payer: Self-pay

## 2024-11-18 ENCOUNTER — Inpatient Hospital Stay (HOSPITAL_COMMUNITY)
Admission: AD | Admit: 2024-11-18 | Discharge: 2024-11-18 | Disposition: A | Discharge status: Home / Self Care | Attending: Obstetrics and Gynecology | Admitting: Obstetrics and Gynecology

## 2024-11-18 DIAGNOSIS — Z3A11 11 weeks gestation of pregnancy: Secondary | ICD-10-CM

## 2024-11-18 DIAGNOSIS — O26891 Other specified pregnancy related conditions, first trimester: Secondary | ICD-10-CM | POA: Diagnosis not present

## 2024-11-18 DIAGNOSIS — O26899 Other specified pregnancy related conditions, unspecified trimester: Secondary | ICD-10-CM | POA: Diagnosis not present

## 2024-11-18 DIAGNOSIS — M7918 Myalgia, other site: Secondary | ICD-10-CM

## 2024-11-18 DIAGNOSIS — R109 Unspecified abdominal pain: Secondary | ICD-10-CM

## 2024-11-18 DIAGNOSIS — W108XXA Fall (on) (from) other stairs and steps, initial encounter: Secondary | ICD-10-CM

## 2024-11-18 LAB — URINALYSIS, ROUTINE W REFLEX MICROSCOPIC
Bilirubin Urine: NEGATIVE
Glucose, UA: NEGATIVE mg/dL
Hgb urine dipstick: NEGATIVE
Ketones, ur: NEGATIVE mg/dL
Leukocytes,Ua: NEGATIVE
Nitrite: NEGATIVE
Protein, ur: NEGATIVE mg/dL
Specific Gravity, Urine: 1.001 — ABNORMAL LOW (ref 1.005–1.030)
pH: 7 (ref 5.0–8.0)

## 2024-11-18 MED ORDER — CYCLOBENZAPRINE HCL 5 MG PO TABS
10.0000 mg | ORAL_TABLET | Freq: Once | ORAL | Status: AC
  Administered 2024-11-18: 08:00:00 10 mg via ORAL
  Filled 2024-11-18: qty 2

## 2024-11-18 MED ORDER — CYCLOBENZAPRINE HCL 5 MG PO TABS: 5.0000 mg | ORAL_TABLET | Freq: Three times a day (TID) | ORAL | 0 refills | Status: DC | PRN

## 2024-11-18 MED ORDER — ACETAMINOPHEN 500 MG PO TABS
1000.0000 mg | ORAL_TABLET | Freq: Once | ORAL | Status: AC
  Administered 2024-11-18: 08:00:00 1000 mg via ORAL
  Filled 2024-11-18: qty 2

## 2024-11-18 NOTE — MAU Note (Signed)
.  Catherine [name] is a 23 y.o. at [redacted]w[redacted]d here in MAU reporting: Patient reports slipping down the stairs last night around 2200. Patient reports falling back on her butt and hitting her back on the step and going down about 4-5 steps. She thought she would be okay, but an hour later started having back pain and was having a hard time moving around. Patient reports throughout the night felt crampy and used heating pad without relief.   Onset of complaint: yesterday  Pain score: 8/10 Vitals:   11/18/24 0713  BP: 135/88  Pulse: 94  Resp: 20  Temp: 98 F (36.7 C)  SpO2: 100%     FHT: attempted in triage  Lab orders placed from triage:   ua

## 2024-11-18 NOTE — MAU Note (Cosign Needed Addendum)
 Maternal Assessment Unit Provider Note  Subjective: [name] is a 23 y.o. G2P1001 pregnant female at [redacted]w[redacted]d who presents to MAU today with complaint of pelvic cramping after fall.   Patient fell around 2200 last night after slipping down stars hitting butt and back. Reports has had pelvic cramping since 2300. Not improve with 1g acetaminophen  or heating pad last night. No vaginal bleeding.  Pain is 7/10 level  [place] care at [facility]. Prenatal records reviewed.  Pertinent items noted in HPI and remainder of comprehensive ROS otherwise negative.   Objective: BP 135/88 (BP Location: Right Arm)   Pulse 94   Temp 98 F (36.7 C)   Resp 20   Ht 5' 3 (1.6 m)   Wt 120.3 kg   LMP 08/31/2024 (Exact Date)   SpO2 100%   BMI 46.96 kg/m  Physical Exam Vitals and nursing note reviewed.  Constitutional:      General: She is not in acute distress.    Appearance: She is well-developed. She is not diaphoretic.  Eyes:     General: No scleral icterus. Pulmonary:     Effort: Pulmonary effort is normal. No respiratory distress.  Abdominal:     General: There is no distension.     Palpations: Abdomen is soft.     Tenderness: There is abdominal tenderness. There is no guarding or rebound.     Comments: Mild suprapubic TTP  Skin:    General: Skin is warm and dry.  Neurological:     Mental Status: She is alert.     Coordination: Coordination normal.     Results for orders placed or performed during the hospital encounter of 11/18/24 (from the past 24 hours)  Urinalysis, Routine w reflex microscopic -Urine, Clean Catch     Status: Abnormal   Collection Time: 11/18/24  7:50 AM  Result Value Ref Range   Color, Urine STRAW (A) YELLOW   APPearance CLEAR CLEAR   Specific Gravity, Urine 1.001 (L) 1.005 - 1.030   pH 7.0 5.0 - 8.0   Glucose, UA NEGATIVE NEGATIVE mg/dL   Hgb urine dipstick NEGATIVE NEGATIVE   Bilirubin Urine NEGATIVE NEGATIVE   Ketones, ur NEGATIVE NEGATIVE mg/dL    Protein, ur NEGATIVE NEGATIVE mg/dL   Nitrite NEGATIVE NEGATIVE   Leukocytes,Ua NEGATIVE NEGATIVE     MDM: moderate risk  MAU Course: 0720 Bedside ultrasound performed for fetal heart rate and maternal reassurance (fetal heart rate was visualized and documented in good fetal movement was visualized). Single IUP visualized. No subchorionic hemorrhage noted. Unremarkable adnexa.    Pt informed that the ultrasound is considered a limited OB ultrasound and is not intended to be a complete ultrasound exam.  Patient also informed that the ultrasound is not being completed with the intent of assessing for fetal or placental anomalies or any pelvic abnormalities.  Explained that the purpose of todays ultrasound is to assess for  FHR  and viability.  Patient acknowledges the purpose of the exam and the limitations of the study.   FHR at 162 bpm via ultrasound at the Bedside and fetus visually moving on ultrasound.     Ordered 10mg  of cyclobenzaprine  and 1g acetaminophen . Patient gave urine sample for UA.  Transferred care of patient to Camie Rote CNM at 0800.    Trudy Leeroy NOVAK, MD 11/18/2024 8:31 AM      Assessment Medical screening exam complete    ICD-10-CM   1. Cramping affecting pregnancy, antepartum  O26.899  R10.9     2. Fall (on) (from) other stairs and steps, initial encounter  W10.8XXA     3. [redacted] weeks gestation of pregnancy  Z3A.11        Plan Discharge from MAU in stable condition with strict return/ER precautions Follow up at [facility] as scheduled for ongoing prenatal care  Allergies as of 11/18/2024       Reactions   Amoxicillin  Hives        Medication List     TAKE these medications    Magnesium  Malate 1250 (141.7 Mg) MG Tabs Take 1 tablet by mouth daily.   multivitamin-prenatal 27-0.8 MG Tabs tablet Take 1 tablet by mouth daily at 12 noon.         Trudy Leeroy NOVAK, MD 11/18/2024 8:31 AM   Handoff of care received from  Dr. Trudy at [facility] 11/18/24.   Patient with major improvement in pain after administration of Flexeril .  AP  1. Cramping affecting pregnancy, antepartum (Primary) - Discharge patient  2. Fall (on) (from) other stairs and steps, initial encounter  3. [redacted] weeks gestation of pregnancy  4. Musculoskeletal pain  - Flexeril  5mg  TID PO PRN for musculoskeletal pain sent to preferred pharmacy.  - Return precautions advised.  Camie Rote, MSN, CNM, RNC-OB Certified Nurse Midwife, [facility] 11/18/2024 9:35 AM

## 2024-11-21 ENCOUNTER — Ambulatory Visit: Payer: Self-pay | Admitting: Obstetrics and Gynecology

## 2024-11-21 DIAGNOSIS — O0990 Supervision of high risk pregnancy, unspecified, unspecified trimester: Secondary | ICD-10-CM

## 2024-11-21 MED ORDER — VITAMIN D (ERGOCALCIFEROL) 1.25 MG (50000 UNIT) PO CAPS: 50000.0000 [IU] | ORAL_CAPSULE | ORAL | 0 refills | Status: AC

## 2024-12-04 ENCOUNTER — Ambulatory Visit: Admitting: Obstetrics and Gynecology

## 2024-12-04 VITALS — BP 130/85 | HR 83 | Wt 263.2 lb

## 2024-12-04 DIAGNOSIS — O26891 Other specified pregnancy related conditions, first trimester: Secondary | ICD-10-CM | POA: Diagnosis not present

## 2024-12-04 DIAGNOSIS — O99210 Obesity complicating pregnancy, unspecified trimester: Secondary | ICD-10-CM

## 2024-12-04 DIAGNOSIS — Z1379 Encounter for other screening for genetic and chromosomal anomalies: Secondary | ICD-10-CM

## 2024-12-04 DIAGNOSIS — O99211 Obesity complicating pregnancy, first trimester: Secondary | ICD-10-CM

## 2024-12-04 DIAGNOSIS — O0991 Supervision of high risk pregnancy, unspecified, first trimester: Secondary | ICD-10-CM

## 2024-12-04 DIAGNOSIS — R519 Headache, unspecified: Secondary | ICD-10-CM | POA: Diagnosis not present

## 2024-12-04 DIAGNOSIS — Z3A13 13 weeks gestation of pregnancy: Secondary | ICD-10-CM

## 2024-12-04 DIAGNOSIS — O0990 Supervision of high risk pregnancy, unspecified, unspecified trimester: Secondary | ICD-10-CM

## 2024-12-04 MED ORDER — ASPIRIN 81 MG PO CHEW: 81.0000 mg | CHEWABLE_TABLET | Freq: Every day | ORAL | 7 refills | Status: AC

## 2024-12-04 NOTE — Progress Notes (Unsigned)
" °  HIGH-RISK PREGNANCY OFFICE VISIT Patient name: Catherine [name] MRN [id number]  Date of birth: 07-13-2001 Chief Complaint:   Routine Prenatal Visit  History of Present Illness:   Catherine [name] is a 23 y.o. G42P1001 female at [redacted]w[redacted]d with an Estimated Date of Delivery: 06/07/25 being seen today for ongoing management of a high-risk pregnancy complicated by cHTN on no meds, obesity and chronic migraines Today she reports headache. She reports taking Magnesium  has helped lessen the H/As, but does not completely take them away. She has tried Excedrin Tension H/A without any relief. Contractions: Not present. Vag. Bleeding: None.  Movement: Present. denies leaking of fluid.  Review of Systems:   Pertinent items are noted in HPI Denies abnormal vaginal discharge w/ itching/[name]/irritation, headaches, visual changes, shortness of breath, chest pain, abdominal pain, severe nausea/vomiting, or problems with urination or bowel movements unless otherwise stated above. Pertinent History Reviewed:  Reviewed past medical,surgical, social, obstetrical and family history.  Reviewed problem list, medications and allergies. Physical Assessment:   Vitals:   12/04/24 1509  BP: 130/85  Pulse: 83  Weight: 263 lb 4 oz (119.4 kg)  Body mass index is 46.63 kg/m.           Physical Examination:   General appearance: alert, well appearing, and in no distress, oriented to person, place, and time, and overweight  Mental status: alert, oriented to person, place, and time, normal mood, behavior, speech, dress, motor activity, and thought processes  Skin: warm & dry   Extremities: Edema: None    Cardiovascular: normal heart rate noted  Respiratory: normal respiratory effort, no distress  Abdomen: gravid, soft, non-tender  Pelvic: Cervical exam deferred         Fetal Status: Fetal Heart Rate (bpm): 156   Movement: Present    Fetal Surveillance Testing today: none   No results found for this or any previous visit (from  the past 24 hours).  Assessment & Plan:  1) High-risk pregnancy G2P1001 at [redacted]w[redacted]d with an Estimated Date of Delivery: 06/07/25   2) Supervision of high risk pregnancy, antepartum (Primary) - PANORAMA PRENATAL TEST - Doing well  3) Obesity in pregnancy - Advised that she meets the criteria to start taking a daily bASA to help reduce chances of PEC later in pregnancy - Prescription for: aspirin 81 MG chewable tablet; Chew 1 tablet (81 mg total) by mouth daily.  Dispense: 30 tablet; Refill: 7  4) Genetic screening - PANORAMA PRENATAL TEST  5) Headache in Pregnancy - Referral to see Darice Ege on Friday 12/06/2024  6) [redacted] weeks gestation of pregnancy    Meds:  Meds ordered this encounter  Medications   aspirin 81 MG chewable tablet    Sig: Chew 1 tablet (81 mg total) by mouth daily.    Dispense:  30 tablet    Refill:  7    Supervising Provider:   PRATT, TANYA S [[id number]]    Labs/procedures today: Panorama  Treatment Plan:  follow MFM guidelines for obesity and cHTN  Reviewed: Preterm labor symptoms and general obstetric precautions including but not limited to vaginal bleeding, contractions, leaking of fluid and fetal movement were reviewed in detail with the patient.  All questions were answered. Has home bp cuff. Check bp weekly, let us  know if >140/90.   Follow-up: Return in about 4 weeks (around 01/01/2025) for Return OB visit.  Orders Placed This Encounter  Procedures   PANORAMA PRENATAL TEST   Ala Cart MSN, CNM 12/04/2024 3:33 PM "

## 2024-12-06 ENCOUNTER — Ambulatory Visit: Admitting: Physician Assistant

## 2024-12-06 ENCOUNTER — Encounter: Payer: Self-pay | Admitting: Physician Assistant

## 2024-12-06 VITALS — BP 121/81 | HR 94 | Wt 260.0 lb

## 2024-12-06 DIAGNOSIS — O26899 Other specified pregnancy related conditions, unspecified trimester: Secondary | ICD-10-CM

## 2024-12-06 DIAGNOSIS — G43009 Migraine without aura, not intractable, without status migrainosus: Secondary | ICD-10-CM

## 2024-12-06 DIAGNOSIS — R519 Headache, unspecified: Secondary | ICD-10-CM | POA: Diagnosis not present

## 2024-12-06 MED ORDER — PROMETHAZINE HCL 12.5 MG PO TABS: 12.5000 mg | ORAL_TABLET | Freq: Four times a day (QID) | ORAL | 1 refills | Status: AC | PRN

## 2024-12-06 MED ORDER — BUTALBITAL-APAP-CAFFEINE 50-325-40 MG PO CAPS: 1.0000 | ORAL_CAPSULE | Freq: Four times a day (QID) | ORAL | 1 refills | Status: AC | PRN

## 2024-12-06 MED ORDER — CYCLOBENZAPRINE HCL 10 MG PO TABS: 10.0000 mg | ORAL_TABLET | Freq: Three times a day (TID) | ORAL | 2 refills | Status: AC | PRN

## 2024-12-06 NOTE — Progress Notes (Signed)
 New Headache  History:  Catherine [name] is a 24 y.o. G2P1001 who presents to clinic today for new headache eval.  She saw someone regarding headaches back when she was around 31.  Her headaches can be unilateral or bilateral, located around the eyes or temple.  There is throbbing and it becomes worse with movement.  It can last up to all day or more than a day.  It can be triggered by light and made worse with sound/smells. There is nausea and vomiting. She may feel better with vomiting.  She has to loosen her hair due to pain and she may have tooth pain.  She sometimes sees lights/has blurry vision/dizziness during the headache. Pt using mag oxide 250mg .  Prior to pregnancy, she took naproxen /ibuprofen  with minimal benefit.  Excedrin helped minimally.  She has taken tylenol , tension headache. Pt's pregnancy complicated by hypertension - stable today.   HIT6:62 Number of days in the last 4 weeks with:  Severe headache: 8 Moderate headache: 7 Mild headache: 7  No headache: 6   Past Medical History:  Diagnosis Date   Anxiety    Asthma    past hx   Childhood asthma, mild intermittent, uncomplicated 02/01/2007   Qualifier: Diagnosis of   By: Bari MD, Theodoro         Depression    Family history of brain aneurysm- father died around age 69  2020-04-10   Gestational hypertension 01/14/2024   History of anemia Apr 10, 2020   History of gestational hypertension 10/24/2024   Hyperlipidemia 07/05/2024   Menorrhagia with irregular cycle 04/10/2020   Migraine    Polyuria Apr 10, 2020    Social History   Socioeconomic History   Marital status: Significant Other    Spouse name: Coletta   Number of children: Not on file   Years of education: Not on file   Highest education level: Not on file  Occupational History   Not on file  Tobacco Use   Smoking status: Never    Passive exposure: Never   Smokeless tobacco: Never  Vaping Use   Vaping status: Never Used  Substance and Sexual Activity    Alcohol use: Not Currently    Comment: once per month, 1-2 drinks   Drug use: Not Currently    Comment: last use feb 2024   Sexual activity: Yes    Birth control/protection: None  Other Topics Concern   Not on file  Social History Narrative   Not on file   Social Drivers of Health   Tobacco Use: Low Risk (06/21/2024)   Patient History    Smoking Tobacco Use: Never    Smokeless Tobacco Use: Never    Passive Exposure: Never  Financial Resource Strain: Not on file  Food Insecurity: No Food Insecurity (01/14/2024)   Hunger Vital Sign    Worried About Running Out of Food in the Last Year: Never true    Ran Out of Food in the Last Year: Never true  Transportation Needs: No Transportation Needs (01/14/2024)   PRAPARE - Administrator, Civil Service (Medical): No    Lack of Transportation (Non-Medical): No  Physical Activity: Not on file  Stress: Not on file  Social Connections: Moderately Integrated (01/18/2023)   Social Connection and Isolation Panel    Frequency of Communication with Friends and Family: More than three times a week    Frequency of Social Gatherings with Friends and Family: More than three times a week    Attends Religious Services:  More than 4 times per year    Active Member of Clubs or Organizations: No    Attends Banker Meetings: Never    Marital Status: Living with partner  Intimate Partner Violence: Not At Risk (01/17/2024)   Humiliation, Afraid, Rape, and Kick questionnaire    Fear of Current or Ex-Partner: No    Emotionally Abused: No    Physically Abused: No    Sexually Abused: No  Depression (PHQ2-9): Low Risk (11/14/2024)   Depression (PHQ2-9)    PHQ-2 Score: 2  Alcohol Screen: Not on file  Housing: Unknown (01/14/2024)   Housing Stability Vital Sign    Unable to Pay for Housing in the Last Year: No    Number of Times Moved in the Last Year: Not on file    Homeless in the Last Year: No  Utilities: Not At Risk (01/14/2024)   AHC  Utilities    Threatened with loss of utilities: No  Health Literacy: Not on file    Family History  Problem Relation Age of Onset   Heart disease Mother    Bipolar disorder Mother    Depression Mother    Cancer Mother    Schizophrenia Mother    Asthma Father    Hypertension Father    Aneurysm Father    Hypertension Brother    Diabetes Brother    Breast cancer Maternal Grandmother    Cancer Maternal Grandmother    Lung disease Maternal Grandmother    Heart disease Maternal Grandfather    Heart attack Maternal Grandfather    Aneurysm Paternal Grandfather     Allergies[1]  Medications Ordered Prior to Encounter[2]   Review of Systems:  All pertinent positive/negative included in HPI, all other review of systems are negative   Objective:  Physical Exam BP 121/81   Pulse 94   Wt 260 lb (117.9 kg)   LMP 08/31/2024 (Exact Date)   BMI 46.06 kg/m  CONSTITUTIONAL: Well-developed, well-nourished female in no acute distress.  EYES: EOM intact ENT: Normocephalic CARDIOVASCULAR: Regular rate  RESPIRATORY: Normal rate.  MUSCULOSKELETAL: Normal ROM SKIN: Warm, dry without erythema  NEUROLOGICAL: Alert, oriented, CN II-XII grossly intact, Appropriate balance PSYCH: Normal behavior, mood  Past Imaging Reviewed IMPRESSION: MRI brain:   Unremarkable MRI appearance of the brain. No evidence of an acute intracranial abnormality.   MRA head:   No proximal intracranial large vessel occlusion or high-grade proximal arterial stenosis.   MRA neck:   The common carotid, internal carotid and vertebral arteries are patent within the neck without stenosis.     Electronically Signed   By: Rockey Childs D.O.   On: 02/21/2024 13:57 Assessment & Plan:  Assessment: 1. Migraine without aura and without status migrainosus, not intractable   2. Headache in pregnancy, antepartum      Plan: Flexeril  - use 1/2 to 1 nightly for one week to reduce baseline spasm and then as needed  for muscle tension OR headache.  Limit of 3/day with sedation precautions in place Fioricet - 1-2 tabs q 4-6 hours as needed for headache - limit use to 2 days per week to avoid dependence.  Do NOT use other tylenol  products in combination with this medication Phenergan  - use 1-2 tab to treat nausea/or for headache rescue.Sedation precautions given Self care encouraged,  topical products such as Biofreeze may help, use heat/cold topically, meditation and yoga also helpful when designed for stage of pregnancy Increase magnesium  (currently oxide 250mg  1 every day) Follow-up PRN  62  minutes spent face to face with patient this encounter.  Nance Gretta Darice FORBES, PA-C 12/06/2024 9:31 AM     [1]  Allergies Allergen Reactions   Amoxicillin  Hives  [2]  Current Outpatient Medications on File Prior to Visit  Medication Sig Dispense Refill   aspirin 81 MG chewable tablet Chew 1 tablet (81 mg total) by mouth daily. 30 tablet 7   cyclobenzaprine  (FLEXERIL ) 5 MG tablet Take 1 tablet (5 mg total) by mouth 3 (three) times daily as needed. 20 tablet 0   Magnesium  Malate 1250 (141.7 Mg) MG TABS Take 1 tablet by mouth daily.     Prenatal Vit-Fe Fumarate-FA (MULTIVITAMIN-PRENATAL) 27-0.8 MG TABS tablet Take 1 tablet by mouth daily at 12 noon.     Vitamin D , Ergocalciferol , (DRISDOL ) 1.25 MG (50000 UNIT) CAPS capsule Take 1 capsule (50,000 Units total) by mouth every 7 (seven) days for 12 doses. 12 capsule 0   No current facility-administered medications on file prior to visit.

## 2024-12-11 ENCOUNTER — Ambulatory Visit: Payer: Self-pay | Admitting: Obstetrics and Gynecology

## 2024-12-11 LAB — PANORAMA PRENATAL TEST FULL PANEL:PANORAMA TEST PLUS 5 ADDITIONAL MICRODELETIONS: FETAL FRACTION: 9.2

## 2024-12-13 ENCOUNTER — Other Ambulatory Visit: Payer: Self-pay

## 2024-12-13 ENCOUNTER — Inpatient Hospital Stay (HOSPITAL_COMMUNITY)

## 2024-12-13 ENCOUNTER — Inpatient Hospital Stay (HOSPITAL_COMMUNITY)
Admission: AD | Admit: 2024-12-13 | Discharge: 2024-12-13 | Disposition: A | Discharge status: Home / Self Care | Attending: Obstetrics & Gynecology | Admitting: Obstetrics & Gynecology

## 2024-12-13 DIAGNOSIS — O99282 Endocrine, nutritional and metabolic diseases complicating pregnancy, second trimester: Secondary | ICD-10-CM | POA: Diagnosis not present

## 2024-12-13 DIAGNOSIS — O99612 Diseases of the digestive system complicating pregnancy, second trimester: Secondary | ICD-10-CM | POA: Diagnosis present

## 2024-12-13 DIAGNOSIS — Z7982 Long term (current) use of aspirin: Secondary | ICD-10-CM | POA: Insufficient documentation

## 2024-12-13 DIAGNOSIS — R12 Heartburn: Secondary | ICD-10-CM | POA: Diagnosis not present

## 2024-12-13 DIAGNOSIS — O10912 Unspecified pre-existing hypertension complicating pregnancy, second trimester: Secondary | ICD-10-CM | POA: Insufficient documentation

## 2024-12-13 DIAGNOSIS — E785 Hyperlipidemia, unspecified: Secondary | ICD-10-CM | POA: Diagnosis not present

## 2024-12-13 DIAGNOSIS — K219 Gastro-esophageal reflux disease without esophagitis: Secondary | ICD-10-CM | POA: Diagnosis not present

## 2024-12-13 DIAGNOSIS — O10919 Unspecified pre-existing hypertension complicating pregnancy, unspecified trimester: Secondary | ICD-10-CM

## 2024-12-13 DIAGNOSIS — O26892 Other specified pregnancy related conditions, second trimester: Secondary | ICD-10-CM | POA: Diagnosis not present

## 2024-12-13 DIAGNOSIS — Z3A14 14 weeks gestation of pregnancy: Secondary | ICD-10-CM | POA: Diagnosis not present

## 2024-12-13 DIAGNOSIS — R1030 Lower abdominal pain, unspecified: Secondary | ICD-10-CM | POA: Diagnosis not present

## 2024-12-13 LAB — CBC WITH DIFFERENTIAL/PLATELET
Abs Immature Granulocytes: 0.05 K/uL (ref 0.00–0.07)
Basophils Absolute: 0 K/uL (ref 0.0–0.1)
Basophils Relative: 0 %
Eosinophils Absolute: 0.3 K/uL (ref 0.0–0.5)
Eosinophils Relative: 2 %
HCT: 36.1 % (ref 36.0–46.0)
Hemoglobin: 11.9 g/dL — ABNORMAL LOW (ref 12.0–15.0)
Immature Granulocytes: 0 %
Lymphocytes Relative: 27 %
Lymphs Abs: 3.3 K/uL (ref 0.7–4.0)
MCH: 24.8 pg — ABNORMAL LOW (ref 26.0–34.0)
MCHC: 33 g/dL (ref 30.0–36.0)
MCV: 75.4 fL — ABNORMAL LOW (ref 80.0–100.0)
Monocytes Absolute: 0.6 K/uL (ref 0.1–1.0)
Monocytes Relative: 5 %
Neutro Abs: 7.8 K/uL — ABNORMAL HIGH (ref 1.7–7.7)
Neutrophils Relative %: 66 %
Platelets: 364 K/uL (ref 150–400)
RBC: 4.79 MIL/uL (ref 3.87–5.11)
RDW: 15.3 % (ref 11.5–15.5)
WBC: 12 K/uL — ABNORMAL HIGH (ref 4.0–10.5)
nRBC: 0 % (ref 0.0–0.2)

## 2024-12-13 LAB — COMPREHENSIVE METABOLIC PANEL WITH GFR
ALT: 12 U/L (ref 0–44)
AST: 11 U/L — ABNORMAL LOW (ref 15–41)
Albumin: 3.7 g/dL (ref 3.5–5.0)
Alkaline Phosphatase: 60 U/L (ref 38–126)
Anion gap: 9 (ref 5–15)
BUN: 8 mg/dL (ref 6–20)
CO2: 23 mmol/L (ref 22–32)
Calcium: 9.4 mg/dL (ref 8.9–10.3)
Chloride: 104 mmol/L (ref 98–111)
Creatinine, Ser: 0.58 mg/dL (ref 0.44–1.00)
GFR, Estimated: >60 mL/min
Glucose, Bld: 90 mg/dL (ref 70–99)
Potassium: 4.5 mmol/L (ref 3.5–5.1)
Sodium: 137 mmol/L (ref 135–145)
Total Bilirubin: <0.2 mg/dL (ref 0.0–1.2)
Total Protein: 6.4 g/dL — ABNORMAL LOW (ref 6.5–8.1)

## 2024-12-13 LAB — PRO BRAIN NATRIURETIC PEPTIDE: Pro Brain Natriuretic Peptide: <50 pg/mL

## 2024-12-13 LAB — LIPASE, BLOOD: Lipase: 16 U/L (ref 11–51)

## 2024-12-13 LAB — TROPONIN T, HIGH SENSITIVITY: Troponin T High Sensitivity: <15 ng/L (ref 0–19)

## 2024-12-13 MED ORDER — FAMOTIDINE 20 MG PO TABS
20.0000 mg | ORAL_TABLET | Freq: Once | ORAL | Status: AC
  Administered 2024-12-13: 20 mg via ORAL
  Filled 2024-12-13: qty 1

## 2024-12-13 MED ORDER — FAMOTIDINE 20 MG PO TABS: 20.0000 mg | ORAL_TABLET | Freq: Every day | ORAL | 4 refills | Status: AC

## 2024-12-13 NOTE — MAU Note (Signed)
 Catherine [name] is a 24 y.o. at [redacted]w[redacted]d here in MAU reporting: she began having chest pain this morning at 0530.  Reports chest pain has continued throughout the day and radiates into left shoulder.  Reports pain is a constant ache that she thought was gas pains.  States took BP @ 1728 this evening and BP 132/100.  Denies VB and LOF, has some lower abdominal cramping.  LMP: 08/31/2024 Onset of complaint: today Pain score: 8 Vitals:   12/13/24 1900  BP: (!) 141/78  Pulse: 93  Resp: 18  Temp: 98.5 F (36.9 C)  SpO2: 100%     FHT: 158 bpm  Lab orders placed from triage: EKG

## 2024-12-13 NOTE — MAU Provider Note (Signed)
 Chief Complaint:  Chest Pain   HPI   None     Catherine [name] is a 24 y.o. G2P1001 at [redacted]w[redacted]d who presents to maternity admissions reporting chest pain.  She reports waking up with chest pain this morning..  The pain has continued throughout the day and radiates to her left shoulder.  She describes the pain as a constant ache that she initially thought was gas pains.  She took her blood pressure at home this evening at 1728 with a reading of 132/100.  Denies vaginal bleeding, leaking of fluid.  Endorses this.  Some lower abdominal cramping.  Pregnancy Course: Receives care at [facility] for [facility] . Prenatal records reviewed.  Pregnancy complicated by chronic hypertension, migraine, GERD,  BMI 45.  She is not currently taking any antihypertensive medication but is taking aspirin  81 mg daily for preeclampsia prophylaxis.  Past Medical History:  Diagnosis Date   Anxiety    Asthma    past hx   Childhood asthma, mild intermittent, uncomplicated 02/01/2007   Qualifier: Diagnosis of   By: Bari MD, Theodoro         Depression    Family history of brain aneurysm- father died around age 60  Apr 11, 2020   Gestational hypertension 01/14/2024   History of anemia 04/11/20   History of gestational hypertension 10/24/2024   Hyperlipidemia 07/05/2024   Menorrhagia with irregular cycle 11-Apr-2020   Migraine    Polyuria 2020/04/11   OB History  Gravida Para Term Preterm AB Living  2 1 1  0 0 1  SAB IAB Ectopic Multiple Live Births  0 0 0 0 1    # Outcome Date GA Lbr Len/2nd Weight Sex Type Anes PTL Lv  2 Current           1 Term 01/16/24 [redacted]w[redacted]d  3030 g F CS-LTranv EPI  LIV   Past Surgical History:  Procedure Laterality Date   CESAREAN SECTION N/A 01/16/2024   Procedure: CESAREAN SECTION;  Surgeon: Alger Gong, MD;  Location: [facility];  Service: Obstetrics;  Laterality: N/A;   TONSILLECTOMY Bilateral 08/11/2015   Procedure: CONTROL POST TONSILLECTOMY BLEEDING ;  Surgeon:  Deward Argue, MD;  Location: [facility] ORS;  Service: ENT;  Laterality: Bilateral;   TONSILLECTOMY AND ADENOIDECTOMY N/A 07/31/2015   Procedure: TONSILLECTOMY AND ADENOIDECTOMY;  Surgeon: Chinita Hasten, MD;  Location: [facility];  Service: ENT;  Laterality: N/A;   WISDOM TOOTH EXTRACTION     Family History  Problem Relation Age of Onset   Heart disease Mother    Bipolar disorder Mother    Depression Mother    Cancer Mother    Schizophrenia Mother    Asthma Father    Hypertension Father    Aneurysm Father    Hypertension Brother    Diabetes Brother    Breast cancer Maternal Grandmother    Cancer Maternal Grandmother    Lung disease Maternal Grandmother    Heart disease Maternal Grandfather    Heart attack Maternal Grandfather    Aneurysm Paternal Grandfather    Social History[1] Allergies[2] Medications Prior to Admission  Medication Sig Dispense Refill Last Dose/Taking   aspirin  81 MG chewable tablet Chew 1 tablet (81 mg total) by mouth daily. 30 tablet 7 12/12/2024 at  8:00 PM   cyclobenzaprine  (FLEXERIL ) 10 MG tablet Take 1 tablet (10 mg total) by mouth 3 (three) times daily as needed for muscle spasms (headache). 40 tablet 2 12/12/2024   Magnesium  Malate 1250 (141.7 Mg)  MG TABS Take 1 tablet by mouth daily.   12/12/2024   Prenatal Vit-Fe Fumarate-FA (MULTIVITAMIN-PRENATAL) 27-0.8 MG TABS tablet Take 1 tablet by mouth daily at 12 noon.   12/12/2024   Butalbital -APAP-Caffeine  50-325-40 MG capsule Take 1-2 capsules by mouth every 6 (six) hours as needed for headache. 30 capsule 1    promethazine  (PHENERGAN ) 12.5 MG tablet Take 1 tablet (12.5 mg total) by mouth every 6 (six) hours as needed for nausea or vomiting. 40 tablet 1    Vitamin D , Ergocalciferol , (DRISDOL ) 1.25 MG (50000 UNIT) CAPS capsule Take 1 capsule (50,000 Units total) by mouth every 7 (seven) days for 12 doses. 12 capsule 0 12/09/2024    I have reviewed patient's Past Medical Hx, Surgical Hx, Family Hx, Social Hx,  medications and allergies.   ROS  Pertinent items noted in HPI and remainder of comprehensive ROS otherwise negative.   PHYSICAL EXAM  Patient Vitals for the past 24 hrs:  BP Temp Temp src Pulse Resp SpO2 Height Weight  12/13/24 1900 (!) 141/78 98.5 F (36.9 C) Oral 93 18 100 % -- --  12/13/24 1853 -- -- -- -- -- -- 5' 3 (1.6 m) 117.7 kg    Constitutional: Well-developed, well-nourished female in no acute distress.  HEENT: atraumatic, normocephalic. Neck has normal ROM. EOM intact. Cardiovascular: normal rate & rhythm, warm and well-perfused Respiratory: normal effort, no problems with respiration noted GI: Abd soft, non-tender, non-distended MSK: Extremities nontender, no edema, normal ROM Skin: warm and dry. Acyanotic, no jaundice or pallor. Neurologic: Alert and oriented x 4. No abnormal coordination. Psychiatric: Normal mood. Speech not slurred, not rapid/pressured. Patient is cooperative.  Labs: Results for orders placed or performed during the hospital encounter of 12/13/24 (from the past 24 hours)  Pro Brain natriuretic peptide     Status: None   Collection Time: 12/13/24  8:36 PM  Result Value Ref Range   Pro Brain Natriuretic Peptide <50.0 <300.0 pg/mL  Troponin T, High Sensitivity     Status: None   Collection Time: 12/13/24  8:36 PM  Result Value Ref Range   Troponin T High Sensitivity <15 0 - 19 ng/L  Comprehensive metabolic panel     Status: Abnormal   Collection Time: 12/13/24  8:36 PM  Result Value Ref Range   Sodium 137 135 - 145 mmol/L   Potassium 4.5 3.5 - 5.1 mmol/L   Chloride 104 98 - 111 mmol/L   CO2 23 22 - 32 mmol/L   Glucose, Bld 90 70 - 99 mg/dL   BUN 8 6 - 20 mg/dL   Creatinine, Ser 9.41 0.44 - 1.00 mg/dL   Calcium  9.4 8.9 - 10.3 mg/dL   Total Protein 6.4 (L) 6.5 - 8.1 g/dL   Albumin 3.7 3.5 - 5.0 g/dL   AST 11 (L) 15 - 41 U/L   ALT 12 0 - 44 U/L   Alkaline Phosphatase 60 38 - 126 U/L   Total Bilirubin <0.2 0.0 - 1.2 mg/dL   GFR, Estimated  >39 >39 mL/min   Anion gap 9 5 - 15  CBC with Differential/Platelet     Status: Abnormal   Collection Time: 12/13/24  8:36 PM  Result Value Ref Range   WBC 12.0 (H) 4.0 - 10.5 K/uL   RBC 4.79 3.87 - 5.11 MIL/uL   Hemoglobin 11.9 (L) 12.0 - 15.0 g/dL   HCT 63.8 63.9 - 53.9 %   MCV 75.4 (L) 80.0 - 100.0 fL   MCH 24.8 (L) 26.0 -  34.0 pg   MCHC 33.0 30.0 - 36.0 g/dL   RDW 84.6 88.4 - 84.4 %   Platelets 364 150 - 400 K/uL   nRBC 0.0 0.0 - 0.2 %   Neutrophils Relative % 66 %   Neutro Abs 7.8 (H) 1.7 - 7.7 K/uL   Lymphocytes Relative 27 %   Lymphs Abs 3.3 0.7 - 4.0 K/uL   Monocytes Relative 5 %   Monocytes Absolute 0.6 0.1 - 1.0 K/uL   Eosinophils Relative 2 %   Eosinophils Absolute 0.3 0.0 - 0.5 K/uL   Basophils Relative 0 %   Basophils Absolute 0.0 0.0 - 0.1 K/uL   Immature Granulocytes 0 %   Abs Immature Granulocytes 0.05 0.00 - 0.07 K/uL    Imaging:  DG Chest 2 View Result Date: 12/13/2024 CLINICAL DATA:  Short of breath, chest pain EXAM: DG CHEST 2V COMPARISON:  06/21/2024 FINDINGS: The heart size and mediastinal contours are within normal limits. Both lungs are clear. The visualized skeletal structures are unremarkable. IMPRESSION: No active cardiopulmonary disease. Electronically Signed   By: Ozell Daring M.D.   On: 12/13/2024 21:38   EKG: EKG: unchanged from previous tracings, normal sinus rhythm. I personally reviewed this EKG for intrepretation.  MDM & MAU COURSE  MDM: High  MAU Course: -Mildly elevated BP, otherwise within normal limits. -BNP for heart failure, EKG and troponin for ACS, CBC for infection, CMP for electrolytes and hepatobiliary pathology, lipase for pancreatitis. -CXR for cardiopulmonary pathology. -EKG negative for STEMI. Unchanged from previous tracing on 06/24/2024. Troponin within normal limits. -BNP within normal limits. -CBC and CMP within normal limits for pregnancy. -CXR negative for pneumonia, pneumothorax, active cardiopulmonary  disease. -Trial famotidine  for possible GERD. -Patient reports symptoms have resolved after famotidine .  Differential diagnosis considered for chest pain includes but is not limited to: ACS, heart failure exacerbation, costochondritis, pneumothorax, pneumonia, chest wall contusion, pericarditis, myocarditis, arrhythmia, aortic dissection   Orders Placed This Encounter  Procedures   DG Chest 2 View   Pro Brain natriuretic peptide   Comprehensive metabolic panel   CBC with Differential/Platelet   Lipase, blood   ED EKG   Discharge patient   Meds ordered this encounter  Medications   famotidine  (PEPCID ) tablet 20 mg   famotidine  (PEPCID ) 20 MG tablet    Sig: Take 1 tablet (20 mg total) by mouth at bedtime.    Dispense:  30 tablet    Refill:  4    ASSESSMENT   1. Heartburn during pregnancy in second trimester   2. Chronic hypertension during pregnancy, antepartum   3. [redacted] weeks gestation of pregnancy     PLAN  Discharge home in stable condition with return precautions.  Start famotidine  for GERD.     Allergies as of 12/13/2024       Reactions   Amoxicillin  Hives        Medication List     TAKE these medications    aspirin  81 MG chewable tablet Chew 1 tablet (81 mg total) by mouth daily.   Butalbital -APAP-Caffeine  50-325-40 MG capsule Take 1-2 capsules by mouth every 6 (six) hours as needed for headache.   cyclobenzaprine  10 MG tablet Commonly known as: FLEXERIL  Take 1 tablet (10 mg total) by mouth 3 (three) times daily as needed for muscle spasms (headache).   famotidine  20 MG tablet Commonly known as: PEPCID  Take 1 tablet (20 mg total) by mouth at bedtime.   Magnesium  Malate 1250 (141.7 Mg) MG Tabs Take 1 tablet  by mouth daily.   multivitamin-prenatal 27-0.8 MG Tabs tablet Take 1 tablet by mouth daily at 12 noon.   promethazine  12.5 MG tablet Commonly known as: PHENERGAN  Take 1 tablet (12.5 mg total) by mouth every 6 (six) hours as needed for nausea  or vomiting.   Vitamin D  (Ergocalciferol ) 1.25 MG (50000 UNIT) Caps capsule Commonly known as: DRISDOL  Take 1 capsule (50,000 Units total) by mouth every 7 (seven) days for 12 doses.        Joesph DELENA Sear, PA      [1]  Social History Tobacco Use   Smoking status: Never    Passive exposure: Never   Smokeless tobacco: Never  Vaping Use   Vaping status: Never Used  Substance Use Topics   Alcohol use: Not Currently    Comment: once per month, 1-2 drinks   Drug use: Not Currently    Comment: last use feb 2024  [2]  Allergies Allergen Reactions   Amoxicillin  Hives

## 2024-12-13 NOTE — Discharge Instructions (Signed)
 HEARTBURN: START famotidine  20 mg once daily. You can use TUMS as needed for any breakthrough symptoms. Eating and drinking Do not drink alcohol while you are pregnant. Learn which foods and drinks make you feel worse, and avoid them. Avoid drinking a lot of liquid with your meals. Avoid eating meals during the 2-3 hours before you go to bed. Avoid lying down right after you eat. Do not exercise right after you eat. Drinks to avoid Coffee and tea, with or without caffeine . Energy drinks and sports drinks. Carbonated drinks or sodas. Citrus fruit juices. Foods to avoid Spicy foods and foods that have a lot of acid in them. These include: Peppers, chili powder, curry powder, vinegar, hot sauces, and barbecue sauce. Citrus fruits, such as oranges, lemons, and limes. Tomato-based foods, such as red sauce, chili, and salsa. High-fat foods, such as: Hot dogs, precooked or cured meats, sausage, ham, and bacon. Whole milk, butter, and cheese. Fried and fatty foods, such as donuts, french fries, potato chips, and high-fat dressings. Chocolate and cocoa. Mint. General instructions If helpful, you can try to raise the head of your bed about 6 inches (15 cm). You can do this by putting blocks under the legs. Sleeping with more pillows does not help with heartburn. Do not smoke or use any products that contain nicotine or tobacco. If you need help quitting, ask your doctor. Wear loose-fitting clothing. Try to lower your stress. You may do yoga or meditation. If you need help, ask your doctor. Stay at a healthy weight. If you are overweight, work with your doctor to safely manage your weight.

## 2025-01-02 ENCOUNTER — Ambulatory Visit (INDEPENDENT_AMBULATORY_CARE_PROVIDER_SITE_OTHER): Admitting: Obstetrics and Gynecology

## 2025-01-02 VITALS — BP 136/75 | HR 101 | Wt 259.0 lb

## 2025-01-02 DIAGNOSIS — O34219 Maternal care for unspecified type scar from previous cesarean delivery: Secondary | ICD-10-CM

## 2025-01-02 DIAGNOSIS — G43109 Migraine with aura, not intractable, without status migrainosus: Secondary | ICD-10-CM

## 2025-01-02 DIAGNOSIS — O10919 Unspecified pre-existing hypertension complicating pregnancy, unspecified trimester: Secondary | ICD-10-CM

## 2025-01-02 DIAGNOSIS — O10912 Unspecified pre-existing hypertension complicating pregnancy, second trimester: Secondary | ICD-10-CM | POA: Diagnosis not present

## 2025-01-02 DIAGNOSIS — O99212 Obesity complicating pregnancy, second trimester: Secondary | ICD-10-CM

## 2025-01-02 DIAGNOSIS — Z6841 Body Mass Index (BMI) 40.0 and over, adult: Secondary | ICD-10-CM

## 2025-01-02 DIAGNOSIS — O0992 Supervision of high risk pregnancy, unspecified, second trimester: Secondary | ICD-10-CM | POA: Diagnosis not present

## 2025-01-02 DIAGNOSIS — O99210 Obesity complicating pregnancy, unspecified trimester: Secondary | ICD-10-CM

## 2025-01-02 DIAGNOSIS — O0990 Supervision of high risk pregnancy, unspecified, unspecified trimester: Secondary | ICD-10-CM

## 2025-01-02 NOTE — Progress Notes (Signed)
 "  PRENATAL VISIT NOTE  Subjective:  Catherine [name] is a 24 y.o. G2P1001 at [redacted]w[redacted]d being seen today for ongoing prenatal care.  She is currently monitored for the following issues for this high-risk pregnancy and has Vitamin D  insufficiency; Migraine with aura and without status migrainosus, not intractable; Anxiety with depression; Lumbar disc herniation with radiculopathy; GERD (gastroesophageal reflux disease); BMI 45.0-49.9, adult (HCC); Obesity in pregnancy; Chronic hypertension during pregnancy, antepartum; Hyperlipidemia; Supervision of high risk pregnancy, antepartum; Previous cesarean delivery affecting pregnancy; and Short interval between pregnancies affecting pregnancy, antepartum on their problem list.  Patient reports HA controlled with flexeril  and Mg.  Contractions: Not present. Vag. Bleeding: None.  Movement: Present. Denies leaking of fluid.   The following portions of the patient's history were reviewed and updated as appropriate: allergies, current medications, past family history, past medical history, past social history, past surgical history and problem list.   Objective:   Vitals:   01/02/25 1022  BP: 136/75  Pulse: (!) 101  Weight: 259 lb (117.5 kg)    Fetal Status:  Fetal Heart Rate (bpm): 148   Movement: Present    General: Alert, oriented and cooperative. Patient is in no acute distress.  Skin: Skin is warm and dry. No rash noted.   Cardiovascular: Normal heart rate noted  Respiratory: Normal respiratory effort, no problems with respiration noted  Abdomen: Soft, gravid, appropriate for gestational age.  Pain/Pressure: Present     Pelvic: Cervical exam deferred        Extremities: Normal range of motion.     Mental Status: Normal mood and affect. Normal behavior. Normal judgment and thought content.      11/14/2024   10:16 AM 06/04/2024    1:12 PM 02/29/2024   10:45 AM  Depression screen PHQ 2/9  Decreased Interest 0 0 0  Down, Depressed, Hopeless 0 0 0   PHQ - 2 Score 0 0 0  Altered sleeping 0 0 0  Tired, decreased energy 1 0 1  Change in appetite 0 0 0  Feeling bad or failure about yourself  0 0 0  Trouble concentrating 1 0 0  Moving slowly or fidgety/restless 0 0 0  Suicidal thoughts 0 0 0  PHQ-9 Score 2 0  1   Difficult doing work/chores  Not difficult at all Not difficult at all     Data saved with a previous flowsheet row definition        11/14/2024   10:16 AM 06/04/2024    1:12 PM 02/29/2024   10:46 AM 12/28/2023   10:41 AM  GAD 7 : Generalized Anxiety Score  Nervous, Anxious, on Edge 0  0  1  0   Control/stop worrying 0  0  1  0   Worry too much - different things 0  0  0  2   Trouble relaxing 1  0  1  0   Restless 0  0  1  1   Easily annoyed or irritable 1  1  0  1   Afraid - awful might happen 0  0  0  1   Total GAD 7 Score 2 1 4 5   Anxiety Difficulty   Not difficult at all      Data saved with a previous flowsheet row definition    Assessment and Plan:  Pregnancy: G2P1001 at [redacted]w[redacted]d 1. Supervision of high risk pregnancy, antepartum (Primary) Declines afp. F/u anatomy u/s  2. BMI 45.0-49.9, adult (HCC) stable  3. Obesity in pregnancy  4. Previous cesarean delivery affecting pregnancy  5. Chronic hypertension during pregnancy, antepartum On no meds. Continue low dose ASA  6. Migraine with aura and without status migrainosus, not intractable Co-managed with [facility].   Preterm labor symptoms and general obstetric precautions including but not limited to vaginal bleeding, contractions, leaking of fluid and fetal movement were reviewed in detail with the patient. Please refer to After Visit Summary for other counseling recommendations.   No follow-ups on file.  Future Appointments  Date Time Provider Department Center  01/02/2025 10:35 AM Izell Harari, MD [facility]  01/23/2025  9:00 AM [facility] PROVIDER 1 [facility]  01/23/2025  9:30 AM [facility] US4 [facility]  01/30/2025 10:35 AM Izell Harari, MD [facility]    Harari Izell, MD  "

## 2025-01-23 ENCOUNTER — Other Ambulatory Visit

## 2025-01-23 ENCOUNTER — Ambulatory Visit

## 2025-01-23 DIAGNOSIS — O34219 Maternal care for unspecified type scar from previous cesarean delivery: Secondary | ICD-10-CM

## 2025-01-23 DIAGNOSIS — O0990 Supervision of high risk pregnancy, unspecified, unspecified trimester: Secondary | ICD-10-CM

## 2025-01-30 ENCOUNTER — Encounter: Admitting: Obstetrics and Gynecology
